# Patient Record
Sex: Female | Born: 1990 | Race: White | Hispanic: No | Smoking: Former smoker
Health system: Southern US, Community
[De-identification: ages and names within clinical notes are randomized; demographics above are authoritative.]

## PROBLEM LIST (undated history)

## (undated) DIAGNOSIS — T8859XA Other complications of anesthesia, initial encounter: Secondary | ICD-10-CM

## (undated) DIAGNOSIS — Z8489 Family history of other specified conditions: Secondary | ICD-10-CM

## (undated) DIAGNOSIS — N840 Polyp of corpus uteri: Secondary | ICD-10-CM

## (undated) DIAGNOSIS — R7689 Other specified abnormal immunological findings in serum: Secondary | ICD-10-CM

## (undated) DIAGNOSIS — R768 Other specified abnormal immunological findings in serum: Secondary | ICD-10-CM

## (undated) DIAGNOSIS — N939 Abnormal uterine and vaginal bleeding, unspecified: Secondary | ICD-10-CM

## (undated) DIAGNOSIS — E282 Polycystic ovarian syndrome: Secondary | ICD-10-CM

## (undated) DIAGNOSIS — M67919 Unspecified disorder of synovium and tendon, unspecified shoulder: Secondary | ICD-10-CM

## (undated) DIAGNOSIS — E559 Vitamin D deficiency, unspecified: Secondary | ICD-10-CM

## (undated) DIAGNOSIS — R569 Unspecified convulsions: Secondary | ICD-10-CM

## (undated) DIAGNOSIS — D649 Anemia, unspecified: Secondary | ICD-10-CM

## (undated) DIAGNOSIS — K611 Rectal abscess: Secondary | ICD-10-CM

## (undated) DIAGNOSIS — R102 Pelvic and perineal pain unspecified side: Secondary | ICD-10-CM

## (undated) DIAGNOSIS — Z87442 Personal history of urinary calculi: Secondary | ICD-10-CM

## (undated) DIAGNOSIS — K219 Gastro-esophageal reflux disease without esophagitis: Secondary | ICD-10-CM

## (undated) DIAGNOSIS — F419 Anxiety disorder, unspecified: Secondary | ICD-10-CM

## (undated) DIAGNOSIS — F40240 Claustrophobia: Secondary | ICD-10-CM

## (undated) DIAGNOSIS — R112 Nausea with vomiting, unspecified: Secondary | ICD-10-CM

## (undated) DIAGNOSIS — Z9889 Other specified postprocedural states: Secondary | ICD-10-CM

---

## 2011-04-28 ENCOUNTER — Emergency Department: Payer: Self-pay | Admitting: *Deleted

## 2011-06-07 ENCOUNTER — Ambulatory Visit: Payer: Self-pay | Admitting: Internal Medicine

## 2011-06-09 ENCOUNTER — Ambulatory Visit: Payer: Self-pay | Admitting: Internal Medicine

## 2011-07-01 ENCOUNTER — Ambulatory Visit: Payer: Self-pay | Admitting: Internal Medicine

## 2011-07-02 ENCOUNTER — Ambulatory Visit: Payer: Self-pay | Admitting: Internal Medicine

## 2011-07-03 ENCOUNTER — Emergency Department: Payer: Self-pay | Admitting: Emergency Medicine

## 2011-07-03 ENCOUNTER — Ambulatory Visit: Payer: Self-pay | Admitting: Internal Medicine

## 2011-07-03 LAB — BASIC METABOLIC PANEL
Anion Gap: 11 (ref 7–16)
BUN: 15 mg/dL (ref 7–18)
Creatinine: 0.8 mg/dL (ref 0.60–1.30)
EGFR (African American): >60
Potassium: 3.2 mmol/L — ABNORMAL LOW (ref 3.5–5.1)
Sodium: 143 mmol/L (ref 136–145)

## 2011-07-03 LAB — CBC WITH DIFFERENTIAL/PLATELET
Eosinophil #: 0.1 10*3/uL (ref 0.0–0.7)
Eosinophil %: 0.9 %
HCT: 41.4 % (ref 35.0–47.0)
Lymphocyte #: 2.8 10*3/uL (ref 1.0–3.6)
MCV: 85 fL (ref 80–100)
Monocyte %: 8.7 %
Neutrophil #: 6.3 10*3/uL (ref 1.4–6.5)
Platelet: 303 10*3/uL (ref 150–440)
RBC: 4.87 10*6/uL (ref 3.80–5.20)
WBC: 10.1 10*3/uL (ref 3.6–11.0)

## 2011-07-03 LAB — PREGNANCY, URINE: Pregnancy Test, Urine: NEGATIVE m[IU]/mL

## 2011-11-12 ENCOUNTER — Ambulatory Visit: Payer: Self-pay | Admitting: Internal Medicine

## 2012-03-21 ENCOUNTER — Emergency Department: Payer: Self-pay | Admitting: Emergency Medicine

## 2012-05-01 ENCOUNTER — Emergency Department: Payer: Self-pay | Admitting: Emergency Medicine

## 2012-05-01 LAB — CBC
HCT: 43.3 % (ref 35.0–47.0)
MCH: 30.6 pg (ref 26.0–34.0)
MCHC: 34.1 g/dL (ref 32.0–36.0)
MCV: 90 fL (ref 80–100)
RDW: 14.1 % (ref 11.5–14.5)

## 2012-05-01 LAB — URINALYSIS, COMPLETE
Bilirubin,UR: NEGATIVE
Glucose,UR: NEGATIVE mg/dL (ref 0–75)
Ketone: NEGATIVE
Protein: NEGATIVE
Specific Gravity: 1.026 (ref 1.003–1.030)
WBC UR: 11 /HPF (ref 0–5)

## 2012-05-01 LAB — LIPASE, BLOOD: Lipase: 240 U/L (ref 73–393)

## 2012-05-01 LAB — COMPREHENSIVE METABOLIC PANEL
Albumin: 4.4 g/dL (ref 3.4–5.0)
Alkaline Phosphatase: 82 U/L (ref 50–136)
BUN: 11 mg/dL (ref 7–18)
Creatinine: 0.83 mg/dL (ref 0.60–1.30)
Glucose: 73 mg/dL (ref 65–99)
Potassium: 3.5 mmol/L (ref 3.5–5.1)
SGOT(AST): 21 U/L (ref 15–37)
SGPT (ALT): 27 U/L (ref 12–78)
Total Protein: 8.5 g/dL — ABNORMAL HIGH (ref 6.4–8.2)

## 2012-07-12 ENCOUNTER — Ambulatory Visit: Payer: Self-pay | Admitting: Family Medicine

## 2012-07-12 LAB — URINALYSIS, COMPLETE
Blood: NEGATIVE
Glucose,UR: NEGATIVE mg/dL (ref 0–75)
Leukocyte Esterase: NEGATIVE
Ph: 7 (ref 4.5–8.0)
Protein: NEGATIVE
Specific Gravity: 1.01 (ref 1.003–1.030)

## 2012-07-14 LAB — URINE CULTURE

## 2012-08-14 ENCOUNTER — Ambulatory Visit: Payer: Self-pay | Admitting: Gastroenterology

## 2012-08-14 DIAGNOSIS — K2100 Gastro-esophageal reflux disease with esophagitis, without bleeding: Secondary | ICD-10-CM | POA: Insufficient documentation

## 2012-09-11 ENCOUNTER — Ambulatory Visit: Payer: Self-pay | Admitting: Gastroenterology

## 2012-10-19 ENCOUNTER — Ambulatory Visit: Payer: Self-pay | Admitting: Surgery

## 2012-10-19 HISTORY — PX: CHOLECYSTECTOMY: SHX55

## 2012-10-20 LAB — PATHOLOGY REPORT

## 2012-12-13 ENCOUNTER — Ambulatory Visit: Payer: Self-pay | Admitting: Internal Medicine

## 2012-12-13 LAB — URINALYSIS, COMPLETE
Bilirubin,UR: NEGATIVE
Ketone: NEGATIVE
Leukocyte Esterase: NEGATIVE
Nitrite: NEGATIVE

## 2012-12-13 LAB — WET PREP, GENITAL

## 2012-12-14 LAB — GC/CHLAMYDIA PROBE AMP

## 2012-12-14 LAB — URINE CULTURE

## 2013-01-02 ENCOUNTER — Ambulatory Visit: Payer: Self-pay | Admitting: Family Medicine

## 2013-03-30 ENCOUNTER — Ambulatory Visit: Payer: Self-pay | Admitting: Gastroenterology

## 2013-04-02 ENCOUNTER — Emergency Department: Payer: Self-pay | Admitting: Emergency Medicine

## 2013-04-02 LAB — CBC
HGB: 14.4 g/dL (ref 12.0–16.0)
Platelet: 244 10*3/uL (ref 150–440)
RBC: 4.55 10*6/uL (ref 3.80–5.20)
WBC: 8.9 10*3/uL (ref 3.6–11.0)

## 2013-04-02 LAB — COMPREHENSIVE METABOLIC PANEL
Albumin: 4.3 g/dL (ref 3.4–5.0)
Chloride: 106 mmol/L (ref 98–107)
Co2: 27 mmol/L (ref 21–32)
Creatinine: 0.77 mg/dL (ref 0.60–1.30)
EGFR (African American): >60
Glucose: 75 mg/dL (ref 65–99)
SGOT(AST): 17 U/L (ref 15–37)
SGPT (ALT): 37 U/L (ref 12–78)
Sodium: 138 mmol/L (ref 136–145)
Total Protein: 7.8 g/dL (ref 6.4–8.2)

## 2013-04-02 LAB — URINALYSIS, COMPLETE
Bilirubin,UR: NEGATIVE
Blood: NEGATIVE
Leukocyte Esterase: NEGATIVE
Ph: 5 (ref 4.5–8.0)
RBC,UR: 6 /HPF (ref 0–5)

## 2013-04-02 LAB — WET PREP, GENITAL

## 2013-04-06 ENCOUNTER — Ambulatory Visit: Payer: Self-pay | Admitting: Gastroenterology

## 2013-04-13 ENCOUNTER — Ambulatory Visit: Payer: Self-pay | Admitting: Gastroenterology

## 2013-06-01 ENCOUNTER — Ambulatory Visit: Payer: Self-pay | Admitting: Internal Medicine

## 2013-08-03 ENCOUNTER — Ambulatory Visit: Payer: Self-pay | Admitting: Gastroenterology

## 2013-08-06 LAB — PATHOLOGY REPORT

## 2013-09-04 ENCOUNTER — Ambulatory Visit: Payer: Self-pay | Admitting: Physician Assistant

## 2013-09-04 LAB — PREGNANCY, URINE: Pregnancy Test, Urine: NEGATIVE m[IU]/mL

## 2013-10-16 ENCOUNTER — Ambulatory Visit: Payer: Self-pay | Admitting: Emergency Medicine

## 2013-10-16 LAB — RAPID STREP-A WITH REFLX: Micro Text Report: NEGATIVE

## 2013-10-18 LAB — BETA STREP CULTURE(ARMC)

## 2013-12-25 ENCOUNTER — Ambulatory Visit: Payer: Self-pay | Admitting: Emergency Medicine

## 2013-12-25 LAB — PREGNANCY, URINE: Pregnancy Test, Urine: NEGATIVE m[IU]/mL

## 2014-02-26 ENCOUNTER — Ambulatory Visit: Payer: Self-pay | Admitting: Physician Assistant

## 2014-06-06 ENCOUNTER — Ambulatory Visit: Payer: Self-pay | Admitting: Internal Medicine

## 2014-07-14 ENCOUNTER — Emergency Department: Payer: Self-pay | Admitting: Emergency Medicine

## 2014-09-20 NOTE — Op Note (Signed)
PATIENT NAME:  Debra Richmond, Debra Richmond MR#:  [id number] DATE OF BIRTH:  1991/01/08  DATE OF PROCEDURE:  10/19/2012  PREOPERATIVE DIAGNOSIS: Chronic acalculous cholecystitis.   POSTOPERATIVE DIAGNOSIS: Chronic acalculous cholecystitis.   PROCEDURE: Laparoscopic cholecystectomy.   SURGEON: Renda RollsWilton Smith, MD  ANESTHESIA: General.   INDICATION: This 24 year old female has history of epigastric pains. She had exact reproduction of her pain with injection of cholecystokinin and surgery was recommended for definitive treatment.   DESCRIPTION OF PROCEDURE: The patient was placed on the operating table in the supine position under general anesthesia. The abdomen was prepared with ChloraPrep and draped in a sterile manner.   A short incision was made in the inferior aspect of the umbilicus so that the lower end of the incision stopped just above her piercing. Dissection was carried down to the deep fascia which was grasped with laryngeal hook and elevated. A Veress needle was inserted, aspirated, and irrigated with a saline solution. The peritoneal cavity was inflated with carbon dioxide. The Veress needle was removed. The 10 mm cannula was inserted. The 10 mm, 0 degree laparoscope was inserted to view the peritoneal cavity. The liver appeared normal. The gallbladder was identified. There were no other gross abnormalities identified. The patient was placed in reverse Trendelenburg position and turned several degrees to the left.  The gallbladder was retracted towards the right shoulder. The pouch of Morison was retracted inferiorly and laterally. The porta hepatis was demonstrated. The cystic duct was dissected free from surrounding structures. The cystic artery was dissected free from surrounding structures. The infundibulum was mobilized with incision of the visceral peritoneum and a critical view of safety was demonstrated. An Endo Clip was placed across the cystic duct adjacent to the neck of the gallbladder.  Reddick catheter was probed into the cystic duct, however, the cystic duct was found to be very small, significantly smaller than the catheter, and therefore the catheter would not go in, and therefore cholangiogram was not done. The Reddick catheter was removed. The cystic duct was doubly ligated with endoclips and divided. The cystic artery was controlled with double endoclips and divided. The gallbladder was dissected free from the liver with hook and cautery. Hemostasis was intact. The gallbladder was delivered up through the infraumbilical incision, opened and then suctioned, and it contained some somewhat dark bile. The gallbladder was removed and submitted in formalin for routine pathology. The right upper quadrant was further inspected. Hemostasis was intact. The cannulas were removed. Carbon dioxide was allowed to escape from the peritoneal cavity. Several small subcutaneous bleeding points were cauterized. The wounds were closed with interrupted 5-0 chromic subcuticular sutures, benzoin, and Steri-Strips. Dressings were applied with paper tape. The patient tolerated surgery satisfactorily and was prepared for transfer to the recovery room. ____________________________ Shela CommonsJ. Renda RollsWilton Smith, MD jws:sb D: 10/19/2012 08:49:45 ET T: 10/19/2012 09:06:14 ET JOB#: [id number]  cc: Adella HareJ. Wilton Smith, MD, <Dictator> Adella HareWILTON J SMITH MD ELECTRONICALLY SIGNED 10/20/2012 11:50

## 2014-10-08 ENCOUNTER — Ambulatory Visit
Admission: EM | Admit: 2014-10-08 | Discharge: 2014-10-08 | Disposition: A | Discharge status: Home / Self Care | Payer: BLUE CROSS/BLUE SHIELD | Attending: Family Medicine | Admitting: Family Medicine

## 2014-10-08 ENCOUNTER — Encounter: Payer: Self-pay | Admitting: Emergency Medicine

## 2014-10-08 DIAGNOSIS — S60312A Abrasion of left thumb, initial encounter: Secondary | ICD-10-CM

## 2014-10-08 DIAGNOSIS — S81822A Laceration with foreign body, left lower leg, initial encounter: Secondary | ICD-10-CM | POA: Diagnosis not present

## 2014-10-08 MED ORDER — LIDOCAINE HCL (PF) 1 % IJ SOLN
5.0000 mL | Freq: Once | INTRAMUSCULAR | Status: AC
  Administered 2014-10-08: 5 mL via INTRADERMAL

## 2014-10-08 MED ORDER — MUPIROCIN 2 % EX OINT: TOPICAL_OINTMENT | CUTANEOUS | Status: DC

## 2014-10-08 NOTE — ED Notes (Signed)
Pt fell onto bush that had just been trimmed. Left leg and Right thumb lacerations, bleeding controlled, bandages applied.

## 2014-10-08 NOTE — Discharge Instructions (Signed)
Abrasion An abrasion is a cut or scrape of the skin. Abrasions do not extend through all layers of the skin and most heal within 10 days. It is important to care for your abrasion properly to prevent infection. CAUSES  Most abrasions are caused by falling on, or gliding across, the ground or other surface. When your skin rubs on something, the outer and inner layer of skin rubs off, causing an abrasion. DIAGNOSIS  Your caregiver will be able to diagnose an abrasion during a physical exam.  TREATMENT  Your treatment depends on how large and deep the abrasion is. Generally, your abrasion will be cleaned with water and a mild soap to remove any dirt or debris. An antibiotic ointment may be put over the abrasion to prevent an infection. A bandage (dressing) may be wrapped around the abrasion to keep it from getting dirty.  You may need a tetanus shot if:  You cannot remember when you had your last tetanus shot.  You have never had a tetanus shot.  The injury broke your skin. If you get a tetanus shot, your arm may swell, get red, and feel warm to the touch. This is common and not a problem. If you need a tetanus shot and you choose not to have one, there is a rare chance of getting tetanus. Sickness from tetanus can be serious.  HOME CARE INSTRUCTIONS   If a dressing was applied, change it at least once a day or as directed by your caregiver. If the bandage sticks, soak it off with warm water.   Wash the area with water and a mild soap to remove all the ointment 2 times a day. Rinse off the soap and pat the area dry with a clean towel.   Reapply any ointment as directed by your caregiver. This will help prevent infection and keep the bandage from sticking. Use gauze over the wound and under the dressing to help keep the bandage from sticking.   Change your dressing right away if it becomes wet or dirty.   Only take over-the-counter or prescription medicines for pain, discomfort, or fever as  directed by your caregiver.   Follow up with your caregiver within 24-48 hours for a wound check, or as directed. If you were not given a wound-check appointment, look closely at your abrasion for redness, swelling, or pus. These are signs of infection. SEEK IMMEDIATE MEDICAL CARE IF:   You have increasing pain in the wound.   You have redness, swelling, or tenderness around the wound.   You have pus coming from the wound.   You have a fever or persistent symptoms for more than 2-3 days.  You have a fever and your symptoms suddenly get worse.  You have a bad smell coming from the wound or dressing.  MAKE SURE YOU:   Understand these instructions.  Will watch your condition.  Will get help right away if you are not doing well or get worse. Document Released: 02/24/2005 Document Revised: 05/03/2012 Document Reviewed: 04/20/2011 [facility] Patient Information 2015 [place], [place]. This information is not intended to replace advice given to you by your health care provider. Make sure you discuss any questions you have with your health care provider. Staple Care and Removal Your caregiver has used staples today to repair your wound. Staples are used to help a wound heal faster by holding the edges of the wound together. The staples can be removed when the wound has healed well enough to stay together  after the staples are removed. A dressing (wound covering), depending on the location of the wound, may have been applied. This may be changed once per day or as instructed. If the dressing sticks, it may be soaked off with soapy water or hydrogen peroxide. Only take over-the-counter or prescription medicines for pain, discomfort, or fever as directed by your caregiver.  If you did not receive a tetanus shot today because you did not recall when your last one was given, check with your caregiver when you have your staples removed to determine if one is needed. Return to your caregiver's office  in 1 week or as suggested to have your staples removed. SEEK IMMEDIATE MEDICAL CARE IF:   You have redness, swelling, or increasing pain in the wound.  You have pus coming from the wound.  You have a fever.  You notice a bad smell coming from the wound or dressing.  Your wound edges break open after staples have been removed. Document Released: 02/09/2001 Document Revised: 08/09/2011 Document Reviewed: 02/24/2005 [facility] Patient Information 2015 [place], [place]. This information is not intended to replace advice given to you by your health care provider. Make sure you discuss any questions you have with your health care provider.

## 2014-10-08 NOTE — ED Provider Notes (Signed)
CSN: [id number]     Arrival date & time 10/08/14  1333 History   First MD Initiated Contact with Patient 10/08/14 1345     Chief Complaint  Patient presents with  . Laceration   (Consider location/radiation/quality/duration/timing/severity/associated sxs/prior Treatment) Patient is a 24 y.o. female presenting with skin laceration. The history is provided by the patient. No language interpreter was used.  Laceration Location:  Leg Leg laceration location:  L lower leg Length (cm):  7cm Depth:  Through dermis Bleeding: controlled   Time since incident:  2 hours Pain details:    Quality:  Throbbing   Severity:  Moderate Foreign body present:  Unable to specify Relieved by:  None tried Worsened by:  Movement Ineffective treatments:  None tried Tetanus status:  Up to date   History reviewed. No pertinent past medical history. Past Surgical History  Procedure Laterality Date  . Cholecystectomy     History reviewed. No pertinent family history. History  Substance Use Topics  . Smoking status: Current Every Day Smoker -- 1.00 packs/day    Types: Cigarettes  . Smokeless tobacco: Not on file  . Alcohol Use: Yes   OB History    No data available     Review of Systems  All other systems reviewed and are negative.   Allergies  Dilaudid; Garlic; and Sulfa antibiotics  Home Medications   Prior to Admission medications   Not on File   BP 118/75 mmHg  Pulse 79  Temp(Src) 97.7 F (36.5 C) (Oral)  Resp 16  Ht 5\' 5"  (1.651 m)  Wt 143 lb (64.864 kg)  BMI 23.80 kg/m2  SpO2 99% Physical Exam  Constitutional: She is oriented to person, place, and time.  HENT:  Head: Normocephalic and atraumatic.  Musculoskeletal: She exhibits tenderness.       Arms:      Legs: Neurological: She is alert and oriented to person, place, and time.  Vitals reviewed.   ED Course  LACERATION REPAIR Date/Time: 10/08/2014 3:12 PM Performed by: Hassan RowanWADE, Janasia Coverdale Authorized by: Hassan RowanWADE,  Rene Sizelove Consent: Verbal consent obtained. Consent given by: patient Patient understanding: patient states understanding of the procedure being performed Patient consent: the patient's understanding of the procedure matches consent given Body area: lower extremity Location details: left lower leg Laceration length: 7 cm Foreign body present: cut by branch. Tendon involvement: none Nerve involvement: none Vascular damage: no Local anesthetic: lidocaine 1% without epinephrine Patient sedated: no Preparation: Patient was prepped and draped in the usual sterile fashion. Amount of cleaning: standard Skin closure: staples Technique: simple Approximation: close Approximation difficulty: simple Patient tolerance: Patient tolerated the procedure well with no immediate complications Comments: 6 staples   (including critical care time) Labs Review Labs Reviewed - No data to display  Imaging Review No results found.   MDM   1. Laceration of lower leg with foreign body, left, initial encounter   2. Abrasion of left thumb, initial encounter       Hassan RowanEugene Edric Fetterman, MD 10/08/14 1920

## 2014-10-23 ENCOUNTER — Ambulatory Visit
Admission: EM | Admit: 2014-10-23 | Discharge: 2014-10-23 | Disposition: A | Discharge status: Home / Self Care | Payer: BLUE CROSS/BLUE SHIELD

## 2014-10-23 NOTE — ED Notes (Signed)
6 staples removed without difficulty. Staples sites slightly red. Wound cleaned prior and post staple removal. Sterile dressing applied with conform bandage and retelast.

## 2014-10-23 NOTE — ED Notes (Signed)
[facility] For [facility]

## 2014-11-26 ENCOUNTER — Emergency Department
Admission: EM | Admit: 2014-11-26 | Discharge: 2014-11-26 | Disposition: A | Discharge status: Home / Self Care | Payer: BLUE CROSS/BLUE SHIELD | Attending: Student | Admitting: Student

## 2014-11-26 ENCOUNTER — Encounter: Payer: Self-pay | Admitting: Medical Oncology

## 2014-11-26 DIAGNOSIS — M25511 Pain in right shoulder: Secondary | ICD-10-CM | POA: Insufficient documentation

## 2014-11-26 DIAGNOSIS — Z792 Long term (current) use of antibiotics: Secondary | ICD-10-CM | POA: Diagnosis not present

## 2014-11-26 DIAGNOSIS — Z720 Tobacco use: Secondary | ICD-10-CM | POA: Insufficient documentation

## 2014-11-26 MED ORDER — PREDNISONE 10 MG PO TABS: ORAL_TABLET | ORAL | Status: DC

## 2014-11-26 NOTE — ED Provider Notes (Signed)
[facility] Provider Note  ____________________________________________  Time seen: 1620  I have reviewed the triage vital signs and the nursing notes.   HISTORY  Chief Complaint Shoulder Pain   HPI Debra Booksshley Koury is a 24 y.o. female is here with complaint of right shoulder pain. Patient states she has a history of rotator cuff problems and has been seen by Dr. Hyacinth MeekerMiller in the past. There is been no injury. Patient states she has been doing some lifting of boxes, but no fall or direct injury to her shoulder. Pain is constant with movement and nonradiating. Pain scale is a 6/10. She is not taking any medication over-the-counter. Movement increases her pain where holding it to her side is an improvement.   History reviewed. No pertinent past medical history.  There are no active problems to display for this patient.   Past Surgical History  Procedure Laterality Date  . Cholecystectomy      Current Outpatient Rx  Name  Route  Sig  Dispense  Refill  . mupirocin ointment (BACTROBAN) 2 %      Apply three times a over abraided areaday   22 g   0   . predniSONE (DELTASONE) 10 MG tablet      Take 6 tablets  today, on day 2 take 5 tablets, day 3 take 4 tablets, day 4 take 3 tablets, day 5 take  2 tablets and 1 tablet the last day   21 tablet   0     Allergies Dilaudid; Garlic; and Sulfa antibiotics  No family history on file.  Social History History  Substance Use Topics  . Smoking status: Current Every Day Smoker -- 1.00 packs/day    Types: Cigarettes  . Smokeless tobacco: Not on file  . Alcohol Use: Yes    Review of Systems Constitutional: No fever/chills Eyes: No visual changes. ENT: No sore throat. Cardiovascular: Denies chest pain. Respiratory: Denies shortness of breath. Gastrointestinal: No abdominal pain.  No nausea, no vomiting.  Musculoskeletal: Negative for back pain. Skin: Negative for rash. Neurological:  Negative for headaches, focal weakness or numbness.  10-point ROS otherwise negative.  ____________________________________________   PHYSICAL EXAM:  VITAL SIGNS: ED Triage Vitals  Enc Vitals Group     BP 11/26/14 1607 128/76 mmHg     Pulse Rate 11/26/14 1607 90     Resp 11/26/14 1607 18     Temp 11/26/14 1607 97.9 F (36.6 C)     Temp Source 11/26/14 1607 Oral     SpO2 11/26/14 1607 98 %     Weight 11/26/14 1607 140 lb (63.504 kg)     Height 11/26/14 1607 5\' 5"  (1.651 m)     Head Cir --      Peak Flow --      Pain Score 11/26/14 1608 6     Pain Loc --      Pain Edu? --      Excl. in GC? --     Constitutional: Alert and oriented. Well appearing and in no acute distress. Eyes: Conjunctivae are normal. PERRL. EOMI. Head: Atraumatic. Nose: No congestion/rhinnorhea. Neck: No stridor.  No cervical tenderness on palpation posterior spine. Hematological/Lymphatic/Immunilogical: No cervical lymphadenopathy. Cardiovascular: Normal rate, regular rhythm. Grossly normal heart sounds.  Good peripheral circulation. Respiratory: Normal respiratory effort.  No retractions. Lungs CTAB. Gastrointestinal: Soft and nontender. No distention. No abdominal bruits. No CVA tenderness. Musculoskeletal: No lower extremity tenderness nor edema.  No joint effusions. Right shoulder no gross  deformity noted. Range of motion is slightly restricted especially in abduction secondary to pain. There is no point tenderness to palpation. Strength is slightly decreased secondary to patient's tolerance for pain. Pulses equal bilaterally Neurologic:  Normal speech and language. No gross focal neurologic deficits are appreciated. Speech is normal. No gait instability. Skin:  Skin is warm, dry and intact. No rash noted. Psychiatric: Mood and affect are normal. Speech and behavior are normal.  ____________________________________________   LABS (all labs ordered are listed, but only abnormal results are  displayed)  Labs Reviewed - No data to display  PROCEDURES  Procedure(s) performed: None  Critical Care performed: No  ____________________________________________   INITIAL IMPRESSION / ASSESSMENT AND PLAN / ED COURSE  Pertinent labs & imaging results that were available during my care of the patient were reviewed by me and considered in my medical decision making (see chart for details).  Patient was started on a prednisone taper for 6 days. She is instructed to use ice as needed for pain and to follow-up with Dr. Hyacinth Meeker if any continued problems. ____________________________________________   FINAL CLINICAL IMPRESSION(S) / ED DIAGNOSES  Final diagnoses:  Acute shoulder pain, right      Tommi Rumps, PA-C 11/26/14 1813  Gayla Doss, MD 11/26/14 2045

## 2014-11-26 NOTE — ED Notes (Signed)
See provider note for full assessment

## 2014-11-26 NOTE — ED Notes (Signed)
Pt reports hx of rt rotator cuff problems for a while, in past few days pt reports worsening pain since lifting boxes.

## 2014-11-26 NOTE — ED Notes (Signed)
Right shoulder pain w/o new injury.Debra Richmond.limited movement d/t pain

## 2014-12-11 ENCOUNTER — Ambulatory Visit
Admission: EM | Admit: 2014-12-11 | Discharge: 2014-12-11 | Disposition: A | Discharge status: Home / Self Care | Payer: BLUE CROSS/BLUE SHIELD | Attending: Family Medicine | Admitting: Family Medicine

## 2014-12-11 DIAGNOSIS — S61012A Laceration without foreign body of left thumb without damage to nail, initial encounter: Secondary | ICD-10-CM

## 2014-12-11 MED ORDER — MUPIROCIN 2 % EX OINT: 1.0000 "application " | TOPICAL_OINTMENT | Freq: Three times a day (TID) | CUTANEOUS | Status: DC

## 2014-12-11 NOTE — ED Notes (Signed)
Pt states "I cut myself with a kitchen chopper." A small mostly superficial laceration noted to posterior left hand 1.0 cm x 0 cm. No active bleeding. Tetanus is current.

## 2014-12-11 NOTE — ED Provider Notes (Signed)
CSN: [id number]     Arrival date & time 12/11/14  1430 History   First MD Initiated Contact with Patient 12/11/14 1557     Chief Complaint  Patient presents with  . Laceration   (Consider location/radiation/quality/duration/timing/severity/associated sxs/prior Treatment) HPI  24 year old female who is well-known to this practice from frequent visits especially for lacerations. Today she sustained a cut on her left hand dorsum overlying the CMP joint that she cut on a clean vegetable chopper. She is current on her tetanus. Complaining of slight numbness small area distal to the laceration  History reviewed. No pertinent past medical history. Past Surgical History  Procedure Laterality Date  . Cholecystectomy     No family history on file. History  Substance Use Topics  . Smoking status: Current Every Day Smoker -- 1.00 packs/day    Types: Cigarettes  . Smokeless tobacco: Not on file  . Alcohol Use: Yes   OB History    No data available     Review of Systems  All other systems reviewed and are negative.   Allergies  Dilaudid; Garlic; and Sulfa antibiotics  Home Medications   Prior to Admission medications   Medication Sig Start Date End Date Taking? Authorizing Provider  mupirocin ointment (BACTROBAN) 2 % Apply 1 application topically 3 (three) times daily. 12/11/14   Lutricia FeilWilliam P Yaron Grasse, PA-C  predniSONE (DELTASONE) 10 MG tablet Take 6 tablets  today, on day 2 take 5 tablets, day 3 take 4 tablets, day 4 take 3 tablets, day 5 take  2 tablets and 1 tablet the last day 11/26/14   Tommi Rumpshonda L Summers, PA-C   BP 125/65 mmHg  Pulse 76  Temp(Src) 98.2 F (36.8 C) (Tympanic)  Resp 16  Ht 5\' 4"  (1.626 m)  Wt 135 lb (61.236 kg)  BMI 23.16 kg/m2  SpO2 100%  LMP 12/02/2014 Physical Exam  Constitutional: She appears well-developed and well-nourished.  HENT:  Head: Normocephalic.  Eyes: Pupils are equal, round, and reactive to light.  Skin: Skin is warm and dry.  There is a 1 cm  transverse laceration overlying the dorsum of the hand near the CMP joint. The laceration does gape but is barely into the subcutaneous tissue. She has full extension power of the thumb with us with a mild hip esthesia for a short distance just distal to the laceration.  Psychiatric: She has a normal mood and affect. Her behavior is normal. Judgment and thought content normal.  Nursing note and vitals reviewed.   ED Course  LACERATION REPAIR Date/Time: 12/11/2014 4:27 PM Performed by: Lutricia FeilOEMER, Katiria Calame P Authorized by: Jolene ProvostPATEL, KIRTIDA Consent: Verbal consent obtained. Written consent not obtained. Risks and benefits: risks, benefits and alternatives were discussed Consent given by: patient Patient understanding: patient states understanding of the procedure being performed Patient consent: the patient's understanding of the procedure matches consent given Patient identity confirmed: verbally with patient, arm band and hospital-assigned identification number Body area: upper extremity Location details: left thumb Laceration length: 1 cm Foreign bodies: no foreign bodies Tendon involvement: none Nerve involvement: none Vascular damage: no Anesthesia: local infiltration Local anesthetic: lidocaine 1% without epinephrine Anesthetic total: 1 ml Patient sedated: no Preparation: Patient was prepped and draped in the usual sterile fashion. Irrigation solution: saline Irrigation method: jet lavage Amount of cleaning: standard Debridement: none Degree of undermining: none Skin closure: 4-0 nylon Number of sutures: 2 Technique: simple Approximation: loose Approximation difficulty: simple Dressing: 4x4 sterile gauze and antibiotic ointment Patient tolerance: Patient tolerated the procedure well  with no immediate complications   (including critical care time) Labs Review Labs Reviewed - No data to display  Imaging Review No results found.   MDM   1. Laceration of skin of left thumb,  initial encounter    Discharge Medication List as of 12/11/2014  4:20 PM     Plan: 1. Diagnosis reviewed with patient 2. rx as per orders; risks, benefits, potential side effects reviewed with patient 3. Recommend supportive treatment with elevation,rest. Keep dry 24 hours.  4. F/u 2 days for wound check and plan on leaving sutures in for 10 days  Lutricia Feil, PA-C 12/11/14 1632  Lutricia Feil, PA-C 12/12/14 [phone]

## 2015-01-02 ENCOUNTER — Encounter: Payer: Self-pay | Admitting: Emergency Medicine

## 2015-01-02 ENCOUNTER — Emergency Department
Admission: EM | Admit: 2015-01-02 | Discharge: 2015-01-02 | Disposition: A | Discharge status: Home / Self Care | Payer: Worker's Compensation | Attending: Emergency Medicine | Admitting: Emergency Medicine

## 2015-01-02 DIAGNOSIS — R0600 Dyspnea, unspecified: Secondary | ICD-10-CM | POA: Insufficient documentation

## 2015-01-02 DIAGNOSIS — R1110 Vomiting, unspecified: Secondary | ICD-10-CM | POA: Insufficient documentation

## 2015-01-02 DIAGNOSIS — G4459 Other complicated headache syndrome: Secondary | ICD-10-CM | POA: Insufficient documentation

## 2015-01-02 DIAGNOSIS — Z720 Tobacco use: Secondary | ICD-10-CM | POA: Insufficient documentation

## 2015-01-02 DIAGNOSIS — R079 Chest pain, unspecified: Secondary | ICD-10-CM | POA: Insufficient documentation

## 2015-01-02 DIAGNOSIS — Z792 Long term (current) use of antibiotics: Secondary | ICD-10-CM | POA: Insufficient documentation

## 2015-01-02 LAB — CBC
HCT: 45 % (ref 35.0–47.0)
HEMOGLOBIN: 15.3 g/dL (ref 12.0–16.0)
MCH: 31.1 pg (ref 26.0–34.0)
MCHC: 34 g/dL (ref 32.0–36.0)
MCV: 91.4 fL (ref 80.0–100.0)
Platelets: 256 10*3/uL (ref 150–440)
RBC: 4.92 MIL/uL (ref 3.80–5.20)
RDW: 13.1 % (ref 11.5–14.5)
WBC: 10.7 10*3/uL (ref 3.6–11.0)

## 2015-01-02 LAB — CARBOXYHEMOGLOBIN
CARBOXYHEMOGLOBIN: 2 % (ref 1.5–9.0)
O2 Saturation: 79.8 %
TOTAL OXYGEN CONTENT: 76.8 mL/dL

## 2015-01-02 MED ORDER — ONDANSETRON 4 MG PO TBDP
8.0000 mg | ORAL_TABLET | Freq: Once | ORAL | Status: AC
  Administered 2015-01-02: 8 mg via ORAL

## 2015-01-02 MED ORDER — ONDANSETRON 4 MG PO TBDP
ORAL_TABLET | ORAL | Status: AC
  Filled 2015-01-02: qty 2

## 2015-01-02 MED ORDER — ONDANSETRON 8 MG PO TBDP: 8.0000 mg | ORAL_TABLET | Freq: Three times a day (TID) | ORAL | Status: DC | PRN

## 2015-01-02 NOTE — ED Notes (Signed)
This RN spoke with pt employer and they do not require a uds at this time.

## 2015-01-02 NOTE — ED Provider Notes (Signed)
[facility] Emergency Department Provider Note  ____________________________________________  Time seen: 5:20 PM  I have reviewed the triage vital signs and the nursing notes.   HISTORY  Chief Complaint Ingestion    HPI Debra Richmond is a 24 y.o. female who complains of exposure to a gas leak. 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 She notified the mall manager who told her they would take care of it overnight. However, when she returned today to work, the smallest still there and was more severe. She was exposed to it for 6 hours last night, and again 6 hours today. Throughout the 6 hours today, she began having a severe headache, nausea, shortness of breath and chest tightness, and eventually vomiting. When she started vomiting she left work and came to the ED. She felt also like she was "out of it" and like everything was spinning, although she did not have any falls.  Since being in the ED, she does feel better, but still feels fatigued and nauseated and having a headache.   History reviewed. No pertinent past medical history.  There are no active problems to display for this patient.   Past Surgical History  Procedure Laterality Date  . Cholecystectomy      Current Outpatient Rx  Name  Route  Sig  Dispense  Refill  . mupirocin ointment (BACTROBAN) 2 %   Topical   Apply 1 application topically 3 (three) times daily.   22 g   0   . ondansetron (ZOFRAN ODT) 8 MG disintegrating tablet   Oral   Take 1 tablet (8 mg total) by mouth every 8 (eight) hours as needed for nausea or vomiting.   20 tablet   0   . predniSONE (DELTASONE) 10 MG tablet      Take 6 tablets  today, on day 2 take 5 tablets, day 3 take 4 tablets, day 4 take 3 tablets, day 5 take  2 tablets and 1 tablet the last day   21 tablet  0     Allergies Dilaudid; Garlic; Prednisone; and Sulfa antibiotics  No family history on file.  Social History History  Substance Use Topics  . Smoking status: Current Every Day Smoker -- 1.00 packs/day    Types: Cigarettes  . Smokeless tobacco: Not on file  . Alcohol Use: Yes    Review of Systems  Constitutional: No fever or chills. No weight changes Eyes:No blurry vision or double vision.  ENT: No sore throat. Cardiovascular: Positive chest pain. Respiratory: Mild dyspnea without cough. Gastrointestinal: Negative for abdominal pain, positive vomiting negative diarrhea.  No BRBPR or melena. Genitourinary: Negative for dysuria, urinary retention, bloody urine, or difficulty urinating. Musculoskeletal: Negative for back pain. No joint swelling or pain. Skin: Negative for rash. Neurological: Negative for headaches, focal weakness or numbness. Psychiatric:No anxiety or depression.   Endocrine:No hot/cold intolerance, changes in energy, or sleep difficulty.  10-point ROS otherwise negative.  ____________________________________________   PHYSICAL EXAM:  VITAL SIGNS: ED Triage Vitals  Enc Vitals Group     BP 01/02/15 1507 128/74 mmHg     Pulse Rate 01/02/15 1507 74     Resp 01/02/15 1507 18     Temp 01/02/15 1507 98.2 F (36.8 C)     Temp Source 01/02/15 1507 Oral  SpO2 01/02/15 1507 100 %     Weight 01/02/15 1507 135 lb (61.236 kg)     Height 01/02/15 1507 5\' 5"  (1.651 m)     Head Cir --      Peak Flow --      Pain Score --      Pain Loc --      Pain Edu? --      Excl. in GC? --      Constitutional: Alert and oriented. Well appearing and in no distress. Eyes: No scleral icterus. No conjunctival pallor. PERRL. EOMI ENT   Head: Normocephalic and atraumatic.   Nose: No congestion/rhinnorhea. No septal hematoma   Mouth/Throat: MMM, no pharyngeal erythema. No peritonsillar mass. No uvula shift.   Neck: No stridor. No SubQ emphysema. No  meningismus. Hematological/Lymphatic/Immunilogical: No cervical lymphadenopathy. Cardiovascular: RRR. Normal and symmetric distal pulses are present in all extremities. No murmurs, rubs, or gallops. Respiratory: Normal respiratory effort without tachypnea nor retractions. Breath sounds are clear and equal bilaterally. No wheezes/rales/rhonchi. Gastrointestinal: Soft and nontender. No distention. There is no CVA tenderness.  No rebound, rigidity, or guarding. Genitourinary: deferred Musculoskeletal: Nontender with normal range of motion in all extremities. No joint effusions.  No lower extremity tenderness.  No edema. Neurologic:   Normal speech and language.  CN 2-10 normal. Motor grossly intact. No pronator drift.  Normal gait. No gross focal neurologic deficits are appreciated.  Skin:  Skin is warm, dry and intact. No rash noted.  No petechiae, purpura, or bullae. Psychiatric: Mood and affect are normal. Speech and behavior are normal. Patient exhibits appropriate insight and judgment.  ____________________________________________    LABS (pertinent positives/negatives) (all labs ordered are listed, but only abnormal results are displayed) Labs Reviewed  CBC  CARBOXYHEMOGLOBIN   ____________________________________________   EKG    ____________________________________________    RADIOLOGY    ____________________________________________   PROCEDURES  ____________________________________________   INITIAL IMPRESSION / ASSESSMENT AND PLAN / ED COURSE  Pertinent labs & imaging results that were available during my care of the patient were reviewed by me and considered in my medical decision making (see chart for details).  Patient presents with exposure to a likely gas leak in her home goods store, with symptoms consistent with carbon monoxide toxicity. I placed the patient on 100% nonrebreather mask while awaiting results of a carboxyhemoglobin test. Low suspicion  for cyanide toxicity, stroke CVA meningitis encephalitis ACS PE TAD pneumothorax carditis mediastinitis pneumonia or sepsis.  Of note, patient states that she is in a same sex relationship with another female and has not had intercourse with a female and is very sure that she is not possibly pregnant. ----------------------------------------- 6:39 PM on 01/02/2015 -----------------------------------------  Patient however she feels much better and back to normal. She has removed the oxygen mask by herself and is talking on the phone. Carboxyhemoglobin level is 2 indicating that she has not had significant toxic exposure. Her headache and GI symptoms appear to be a reaction to volatile chemical exposure, but there did not appear to be any lasting toxic effects. We'll prescribe Zofran as needed and have her follow up with primary care. ____________________________________________   FINAL CLINICAL IMPRESSION(S) / ED DIAGNOSES  Final diagnoses:  Other complicated headache syndrome      Sharman Cheek, MD 01/02/15 1840

## 2015-01-02 NOTE — ED Notes (Signed)
Patient reports there was a gas leak yesterday and today at work. Was in area 6 hours last night, 6 hours today. States she does not feel like herself today. Patient is unsure of what kind of gas leak.

## 2015-01-02 NOTE — ED Notes (Signed)
Resp notified of new order for lab.

## 2015-01-04 ENCOUNTER — Ambulatory Visit
Admission: EM | Admit: 2015-01-04 | Discharge: 2015-01-04 | Disposition: A | Discharge status: Home / Self Care | Payer: BLUE CROSS/BLUE SHIELD | Attending: Internal Medicine | Admitting: Internal Medicine

## 2015-01-04 DIAGNOSIS — S0181XA Laceration without foreign body of other part of head, initial encounter: Secondary | ICD-10-CM | POA: Diagnosis not present

## 2015-01-04 MED ORDER — LIDOCAINE-EPINEPHRINE-TETRACAINE (LET) SOLUTION
3.0000 mL | Freq: Once | NASAL | Status: AC
  Administered 2015-01-04: 3 mL via TOPICAL

## 2015-01-04 NOTE — ED Notes (Signed)
Patient with cut above bridge of nose. 1/2 lac. Bleeding controlled.

## 2015-01-04 NOTE — ED Provider Notes (Signed)
CSN: [id number]     Arrival date & time 01/04/15  1318 History   First MD Initiated Contact with Patient 01/04/15 [phone]     Chief Complaint  Patient presents with  . Facial Laceration   (Consider location/radiation/quality/duration/timing/severity/associated sxs/prior Treatment) HPI  24 yo F, with partner, presents for eval and Rx of  forehead laceration.   42 month old puppy jumped in excited play and toenail grazed the medial forehead adjacent to the right eyebrow  Within hour prior to arrival Toenail, not teeth Tetanus up to date Jun 01, 2013 No eye trauma, mild discomfort, not actively bleeding at this time  History reviewed. No pertinent past medical history. Past Surgical History  Procedure Laterality Date  . Cholecystectomy     History reviewed. No pertinent family history. History  Substance Use Topics  . Smoking status: Current Every Day Smoker -- 1.00 packs/day    Types: Cigarettes  . Smokeless tobacco: Not on file  . Alcohol Use: Yes   OB History    No data available     Review of Systems Constitutional: No fever.  Eyes: No visual changes. ENT:No sore throat. Cardiovascular:Negative for chest pain/palpitations Respiratory: Negative for shortness of breath Gastrointestinal: No abdominal pain. No nausea,vomiting, diarrhea Genitourinary: Negative for dysuria. Normal urination. Musculoskeletal: Negative for back pain. FROM extremities without pain Skin: Negative for rash- linear laceration medial forehead Neurological: Negative for headache, focal weakness or numbness Allergies  Dilaudid; Garlic; Prednisone; and Sulfa antibiotics  Home Medications   Prior to Admission medications   Medication Sig Start Date End Date Taking? Authorizing Provider  mupirocin ointment (BACTROBAN) 2 % Apply 1 application topically 3 (three) times daily. 12/11/14   Lutricia Feil, PA-C  ondansetron (ZOFRAN ODT) 8 MG disintegrating tablet Take 1 tablet (8 mg total) by mouth every 8  (eight) hours as needed for nausea or vomiting. 01/02/15   Sharman Cheek, MD  predniSONE (DELTASONE) 10 MG tablet Take 6 tablets  today, on day 2 take 5 tablets, day 3 take 4 tablets, day 4 take 3 tablets, day 5 take  2 tablets and 1 tablet the last day 11/26/14   Tommi Rumps, PA-C   BP 116/71 mmHg  Pulse 78  Temp(Src) 97.4 F (36.3 C) (Tympanic)  Resp 20  Ht 5\' 5"  (1.651 m)  Wt 135 lb (61.236 kg)  BMI 22.47 kg/m2  SpO2 100%  LMP 12/02/2014 Physical Exam  Constitutional -alert and oriented,well appearing and in no acute distress, 2 cm superficial linear laceration adjacent to right medial eyebrow Head- normocephalic Eyes- conjunctiva normal, EOMI ,conjugate gaze- no visual disturbance-FROM eyebrows: frown ,smile, grimace, raised lowered without difficulty Nose- no congestion or rhinorrhea Mouth/throat- mucous membranes moist , Neck- supple without glandular enlargement CV- regular rate, grossly normal heart sounds,  Resp-no distress, normal respiratory effort,clear to auscultation bilaterally GI-no distention GU- not examined MSK- no tender, normal ROM, all extremities, ambulatory, self-care Neuro- normal speech and language, no gross focal neurological deficit appreciated,  Skin-warm,dry ,intact; no rash noted except as noted - Psych-mood and affect grossly normal; speech and behavior grossly normal ED Course  Procedures (including critical care time) Labs Review Labs Reviewed - No data to display  Imaging Review No results found.  LET and 2-x was placed by nursing staff.on arrival  Wound is cleansed and rinsed with saline. Edges re-approximate with gentle pressure.  Discussed with patient and girlfriend and opted to proceed with Dermabond closure. Applied with good results. Dried.  Care discussed.  MDM   1. Laceration of forehead without complication, initial encounter    Diagnosis and treatment discussed.Dermabond/wound care handout given.   Questions  fielded, expectations and recommendations reviewed. Patient expresses understanding. Will return to [facility] with questions, concern or exacerbation.     Rae Halsted, PA-C 01/04/15 [phone]

## 2015-01-04 NOTE — Discharge Instructions (Signed)
Facial Laceration  A facial laceration is a cut on the face. These injuries can be painful and cause bleeding. Lacerations usually heal quickly, but they need special care to reduce scarring. DIAGNOSIS  Your health care provider will take a medical history, ask for details about how the injury occurred, and examine the wound to determine how deep the cut is. TREATMENT  Some facial lacerations may not require closure. Others may not be able to be closed because of an increased risk of infection. The risk of infection and the chance for successful closure will depend on various factors, including the amount of time since the injury occurred. The wound may be cleaned to help prevent infection. If closure is appropriate, pain medicines may be given if needed. Your health care provider will use stitches (sutures), wound glue (adhesive), or skin adhesive strips to repair the laceration. These tools bring the skin edges together to allow for faster healing and a better cosmetic outcome. If needed, you may also be given a tetanus shot. HOME CARE INSTRUCTIONS  Only take over-the-counter or prescription medicines as directed by your health care provider.  Follow your health care provider's instructions for wound care. These instructions will vary depending on the technique used for closing the wound.  You may shower as usual after the first 36 hours. Do not soak the wound in water until the sutures are removed.  For Wound Adhesive: Keep clean and dry for 3-4 days then  You may briefly wet your wound in the shower or bath. Do not soak or scrub the wound. Do not swim. Avoid periods of heavy sweating until the skin adhesive has fallen off on its own. After showering or bathing, gently pat the wound dry with a clean towel.   Do not apply liquid medicine, cream medicine, ointment medicine, or makeup to your wound while the skin adhesive is in place. This may loosen the film before your wound is healed.   If  a dressing is placed over the wound, be careful not to apply tape directly over the skin adhesive. This may cause the adhesive to be pulled off before the wound is healed.   Avoid prolonged exposure to sunlight or tanning lamps while the skin adhesive is in place.  The skin adhesive will usually remain in place for 5-10 days, then naturally fall off the skin. Do not pick at the adhesive film.  After Healing: Once the wound has healed, cover the wound with sunscreen during the day for 1 full year. This can help minimize scarring. Exposure to ultraviolet light in the first year will darken the scar. It can take 1-2 years for the scar to lose its redness and to heal completely.  SEEK IMMEDIATE MEDICAL CARE IF:  You have redness, pain, or swelling around the wound.   You see ayellowish-white fluid (pus) coming from the wound.   You have chills or a fever.  MAKE SURE YOU:  Understand these instructions.  Will watch your condition.  Will get help right away if you are not doing well or get worse. Document Released: 06/24/2004 Document Revised: 03/07/2013 Document Reviewed: 12/28/2012 [facility] Patient Information 2015 [place], [place]. This information is not intended to replace advice given to you by your health care provider. Make sure you discuss any questions you have with your health care provider.

## 2015-04-23 ENCOUNTER — Ambulatory Visit
Admission: EM | Admit: 2015-04-23 | Discharge: 2015-04-23 | Disposition: A | Discharge status: Home / Self Care | Payer: BLUE CROSS/BLUE SHIELD | Attending: Family Medicine | Admitting: Family Medicine

## 2015-04-23 ENCOUNTER — Ambulatory Visit (INDEPENDENT_AMBULATORY_CARE_PROVIDER_SITE_OTHER): Payer: BLUE CROSS/BLUE SHIELD

## 2015-04-23 DIAGNOSIS — S8002XA Contusion of left knee, initial encounter: Secondary | ICD-10-CM | POA: Diagnosis not present

## 2015-04-23 DIAGNOSIS — S81012A Laceration without foreign body, left knee, initial encounter: Secondary | ICD-10-CM

## 2015-04-23 HISTORY — DX: Polycystic ovarian syndrome: E28.2

## 2015-04-23 HISTORY — DX: Gastro-esophageal reflux disease without esophagitis: K21.9

## 2015-04-23 MED ORDER — BACITRACIN ZINC 500 UNIT/GM EX OINT: TOPICAL_OINTMENT | Freq: Two times a day (BID) | CUTANEOUS | Status: DC

## 2015-04-23 MED ORDER — LIDOCAINE HCL (PF) 1 % IJ SOLN: 5.0000 mL | Freq: Once | INTRAMUSCULAR | Status: DC

## 2015-04-23 MED ORDER — LIDOCAINE HCL (PF) 1 % IJ SOLN
5.0000 mL | Freq: Once | INTRAMUSCULAR | Status: AC
  Administered 2015-04-23: 5 mL

## 2015-04-23 MED ORDER — LIDOCAINE-EPINEPHRINE-TETRACAINE (LET) SOLUTION: 3.0000 mL | Freq: Once | NASAL | Status: DC

## 2015-04-23 MED ORDER — LIDOCAINE-EPINEPHRINE-TETRACAINE (LET) SOLUTION
3.0000 mL | Freq: Once | NASAL | Status: AC
  Administered 2015-04-23: 3 mL via TOPICAL

## 2015-04-23 NOTE — ED Provider Notes (Signed)
Debra Richmond. [facility]  ____________________________________________  Time seen: Approximately 3:08 PM  I have reviewed the triage vital signs and the nursing notes.   HISTORY  Chief Complaint Laceration   HPI Debra Richmond is a 24 y.o. female presents for the complaints of left knee pain and laceration. Patient reports a prior to arrival she was climbing over some boxes in her room as she just moved and states that her left knee snagged on something causing laceration. Denies fall. Denies head injury or loss of consciousness. Complains of pain to left knee at 7 out of 10 and states that it does hurt to touch as well as some pain when walking. Denies numbness or tingling sensation. Denies other pain or injury.  Reports last tetanus immunization approximately 2 years ago.   Past Medical History  Diagnosis Date  . GERD (gastroesophageal reflux disease)   . PCOD (polycystic ovarian disease)     There are no active problems to display for this patient.   Past Surgical History  Procedure Laterality Date  . Cholecystectomy      Current Outpatient Rx  Name  Route  Sig  Dispense  Refill  .           .           .             Allergies Dilaudid; Garlic; Prednisone; and Sulfa antibiotics  History reviewed. No pertinent family history.  Social History Social History  Substance Use Topics  . Smoking status: Current Every Day Smoker -- 1.00 packs/day    Types: Cigarettes  . Smokeless tobacco: None  . Alcohol Use: Yes    Review of Systems Constitutional: No fever/chills Eyes: No visual changes. ENT: No sore throat. Cardiovascular: Denies chest pain. Respiratory: Denies shortness of breath. Gastrointestinal: No abdominal pain.  No nausea, no vomiting.  No diarrhea.  No constipation. Genitourinary: Negative for dysuria. Musculoskeletal: Negative for back pain. Left knee pain. Skin: Negative for rash. Laceration to left knee. Neurological: Negative for headaches, focal  weakness or numbness.  10-point ROS otherwise negative.  ____________________________________________   PHYSICAL EXAM:  VITAL SIGNS: ED Triage Vitals  Enc Vitals Group     BP 04/23/15 1457 127/68 mmHg     Pulse Rate 04/23/15 1457 76     Resp 04/23/15 1457 16     Temp 04/23/15 1457 97.3 F (36.3 C)     Temp Source 04/23/15 1457 Tympanic     SpO2 04/23/15 1457 100 %     Weight 04/23/15 1457 143 lb (64.864 kg)     Height 04/23/15 1457 5\' 4"  (1.626 m)     Head Cir --      Peak Flow --      Pain Score 04/23/15 1500 6     Pain Loc --      Pain Edu? --      Excl. in GC? --     Constitutional: Alert and oriented. Well appearing and in no acute distress. Eyes: Conjunctivae are normal. PERRL. EOMI. Head: Atraumatic. Nontender. Nose: No congestion/rhinnorhea.  Mouth/Throat: Mucous membranes are moist.  Oropharynx non-erythematous. Neck: No stridor.  No cervical spine tenderness to palpation. Hematological/Lymphatic/Immunilogical: No cervical lymphadenopathy. Cardiovascular: Normal rate, regular rhythm. Grossly normal heart sounds.  Good peripheral circulation. Respiratory: Normal respiratory effort.  No retractions. Lungs CTAB. Gastrointestinal: Soft and nontender. No distention. Normal Bowel sounds.  . Musculoskeletal: No lower or upper extremity tenderness nor edema.   Except: Left knee tenderness.  See skin below. Neurologic:  Normal speech and language. No gross focal neurologic deficits are appreciated. Skin:  Skin is warm, dry and intact. No rash noted. Except: Anterior left knee over the mid anterior patella 5 cm superficial laceration present, mild to moderate tenderness to palpation direct pressure patella, no surrounding tenderness, knee stable, no pain with anterior or posterior drawer test, no pain with medial or lateral stress test, full range of motion, no visualization foreign bodies, skin otherwise intact. Mild antalgic gait. Psychiatric: Mood and affect are normal.  Speech and behavior are normal.  ____________________________________________   LABS (all labs ordered are listed, but only abnormal results are displayed)  Labs Reviewed - No data to display  RADIOLOGY  LEFT KNEE - COMPLETE 4+ VIEW  COMPARISON: None.  FINDINGS: There is no evidence of fracture, dislocation, or joint effusion. There is no evidence of arthropathy or other focal bone abnormality. Soft tissues are unremarkable.  IMPRESSION: Normal left knee.   Electronically Signed By: Lupita Raider, M.D. On: 04/23/2015 15:28  I, Renford Dills, personally viewed and evaluated these images (plain radiographs) as part of my medical decision making.   ____________________________________________   PROCEDURES  Procedure(s) performed:  Procedure(s) performed:  Procedure explained and verbal consent obtained. Consent: Verbal consent obtained. Written consent not obtained. Risks and benefits: risks, benefits and alternatives were discussed Patient identity confirmed: verbally with patient and hospital-assigned identification number  Consent given by: patient   Laceration Repair Location: left knee Length: 5cm Foreign bodies: no foreign bodies Tendon involvement: none Nerve involvement: none Preparation: Patient was prepped and draped in the usual sterile fashion. Anesthesia with 1% Lidocaine 5 mls Irrigation solution: saline and betadine Irrigation method: jet lavage Amount of cleaning: copious Repaired with 4-0 nylon  Number of sutures: 8 Technique: simple interrupted  Approximation: loose Patient tolerate well. Wound well approximated post repair.  Antibiotic ointment and dressing applied.  Wound care instructions provided.  Observe for any signs of infection or other problems.    Left knee immobilizer applied, neurovascular intact post application. Denies need for crutches.  ____________________________________________   INITIAL IMPRESSION /  ASSESSMENT AND PLAN / ED COURSE  Pertinent labs & imaging results that were available during my care of the patient were reviewed by me and considered in my medical decision making (see chart for details).  Very well-appearing patient. No acute distress. Presents with family at bedside for the month of left knee pain and laceration to left knee sustained prior to arrival. Reports tetanus immunization is up-to-date. Denies other pain or injury. Left knee x-ray normal left knee, no acute changes. 5 cm laceration repaired. Patient to return to urgent care in 7-10 days for suture removal. Dressing applied. Patient also directed and given left knee immobilizer to restrict frequent flexion over sutures and to protect suture integrity. Elevate and apply ice. Discussed cleaning. Work note for today given.  Discussed follow up with Primary care physician this week. Discussed follow up and return parameters including no resolution or any worsening concerns. Patient verbalized understanding and agreed to plan.   ____________________________________________   FINAL CLINICAL IMPRESSION(S) / ED DIAGNOSES  Final diagnoses:  Knee laceration, left, initial encounter  Knee contusion, left, initial encounter       Renford Dills, NP 04/23/15 1628

## 2015-04-23 NOTE — Discharge Instructions (Signed)
Keep clean. Clean daily with soap and water, rinse, pat dry then apply thin layer topical antibiotic ointment. Wear knee immobilizer as discussed. Rest. Avoid strenuous activity. Take over the counter tylenol or ibuprofen as needed.   Follow up with your primary care physician this week as needed. Return to urgent care in 7-10days for suture removal, return sooner for new or worsening concerns.   Contusion A contusion is a deep bruise. Contusions are the result of a blunt injury to tissues and muscle fibers under the skin. The injury causes bleeding under the skin. The skin overlying the contusion may turn blue, purple, or yellow. Minor injuries will give you a painless contusion, but more severe contusions may stay painful and swollen for a few weeks.  CAUSES  This condition is usually caused by a blow, trauma, or direct force to an area of the body. SYMPTOMS  Symptoms of this condition include:  Swelling of the injured area.  Pain and tenderness in the injured area.  Discoloration. The area may have redness and then turn blue, purple, or yellow. DIAGNOSIS  This condition is diagnosed based on a physical exam and medical history. An X-ray, CT scan, or MRI may be needed to determine if there are any associated injuries, such as broken bones (fractures). TREATMENT  Specific treatment for this condition depends on what area of the body was injured. In general, the best treatment for a contusion is resting, icing, applying pressure to (compression), and elevating the injured area. This is often called the RICE strategy. Over-the-counter anti-inflammatory medicines may also be recommended for pain control.  HOME CARE INSTRUCTIONS   Rest the injured area.  If directed, apply ice to the injured area:  Put ice in a plastic bag.  Place a towel between your skin and the bag.  Leave the ice on for 20 minutes, 2-3 times per day.  If directed, apply light compression to the injured area using an  elastic bandage. Make sure the bandage is not wrapped too tightly. Remove and reapply the bandage as directed by your health care provider.  If possible, raise (elevate) the injured area above the level of your heart while you are sitting or lying down.  Take over-the-counter and prescription medicines only as told by your health care provider. SEEK MEDICAL CARE IF:  Your symptoms do not improve after several days of treatment.  Your symptoms get worse.  You have difficulty moving the injured area. SEEK IMMEDIATE MEDICAL CARE IF:   You have severe pain.  You have numbness in a hand or foot.  Your hand or foot turns pale or cold.   This information is not intended to replace advice given to you by your health care provider. Make sure you discuss any questions you have with your health care provider.   Document Released: 02/24/2005 Document Revised: 02/05/2015 Document Reviewed: 10/02/2014 Elsevier Interactive Patient Education 2016 [employer] Inc[employer]  Laceration Care, Adult A laceration is a cut that goes through all of the layers of the skin and into the tissue that is right under the skin. Some lacerations heal on their own. Others need to be closed with stitches (sutures), staples, skin adhesive strips, or skin glue. Proper laceration care minimizes the risk of infection and helps the laceration to heal better. HOW TO CARE FOR YOUR LACERATION If sutures or staples were used:  Keep the wound clean and dry.  If you were given a bandage (dressing), you should change it at least one time per  day or as told by your health care provider. You should also change it if it becomes wet or dirty.  Keep the wound completely dry for the first 24 hours or as told by your health care provider. After that time, you may shower or bathe. However, make sure that the wound is not soaked in water until after the sutures or staples have been removed.  Clean the wound one time each day or as told by your  health care provider:  Wash the wound with soap and water.  Rinse the wound with water to remove all soap.  Pat the wound dry with a clean towel. Do not rub the wound.  After cleaning the wound, apply a thin layer of antibiotic ointmentas told by your health care provider. This will help to prevent infection and keep the dressing from sticking to the wound.  Have the sutures or staples removed as told by your health care provider. If skin adhesive strips were used:  Keep the wound clean and dry.  If you were given a bandage (dressing), you should change it at least one time per day or as told by your health care provider. You should also change it if it becomes dirty or wet.  Do not get the skin adhesive strips wet. You may shower or bathe, but be careful to keep the wound dry.  If the wound gets wet, pat it dry with a clean towel. Do not rub the wound.  Skin adhesive strips fall off on their own. You may trim the strips as the wound heals. Do not remove skin adhesive strips that are still stuck to the wound. They will fall off in time. If skin glue was used:  Try to keep the wound dry, but you may briefly wet it in the shower or bath. Do not soak the wound in water, such as by swimming.  After you have showered or bathed, gently pat the wound dry with a clean towel. Do not rub the wound.  Do not do any activities that will make you sweat heavily until the skin glue has fallen off on its own.  Do not apply liquid, cream, or ointment medicine to the wound while the skin glue is in place. Using those may loosen the film before the wound has healed.  If you were given a bandage (dressing), you should change it at least one time per day or as told by your health care provider. You should also change it if it becomes dirty or wet.  If a dressing is placed over the wound, be careful not to apply tape directly over the skin glue. Doing that may cause the glue to be pulled off before the  wound has healed.  Do not pick at the glue. The skin glue usually remains in place for 5-10 days, then it falls off of the skin. General Instructions  Take over-the-counter and prescription medicines only as told by your health care provider.  If you were prescribed an antibiotic medicine or ointment, take or apply it as told by your doctor. Do not stop using it even if your condition improves.  To help prevent scarring, make sure to cover your wound with sunscreen whenever you are outside after stitches are removed, after adhesive strips are removed, or when glue remains in place and the wound is healed. Make sure to wear a sunscreen of at least 30 SPF.  Do not scratch or pick at the wound.  Keep all follow-up  visits as told by your health care provider. This is important.  Check your wound every day for signs of infection. Watch for:  Redness, swelling, or pain.  Fluid, blood, or pus.  Raise (elevate) the injured area above the level of your heart while you are sitting or lying down, if possible. SEEK MEDICAL CARE IF:  You received a tetanus shot and you have swelling, severe pain, redness, or bleeding at the injection site.  You have a fever.  A wound that was closed breaks open.  You notice a bad smell coming from your wound or your dressing.  You notice something coming out of the wound, such as wood or glass.  Your pain is not controlled with medicine.  You have increased redness, swelling, or pain at the site of your wound.  You have fluid, blood, or pus coming from your wound.  You notice a change in the color of your skin near your wound.  You need to change the dressing frequently due to fluid, blood, or pus draining from the wound.  You develop a new rash.  You develop numbness around the wound. SEEK IMMEDIATE MEDICAL CARE IF:  You develop severe swelling around the wound.  Your pain suddenly increases and is severe.  You develop painful lumps near the  wound or on skin that is anywhere on your body.  You have a red streak going away from your wound.  The wound is on your hand or foot and you cannot properly move a finger or toe.  The wound is on your hand or foot and you notice that your fingers or toes look pale or bluish.   This information is not intended to replace advice given to you by your health care provider. Make sure you discuss any questions you have with your health care provider.   Document Released: 05/17/2005 Document Revised: 10/01/2014 Document Reviewed: 05/13/2014 Elsevier Interactive Patient Education [employer]2016 Else[employer] Inc.

## 2015-04-23 NOTE — ED Notes (Signed)
Hit left knee on "something" in bedroom around 2pm today. Approximate 2 inch laceration on left knee

## 2015-06-28 ENCOUNTER — Emergency Department
Admission: EM | Admit: 2015-06-28 | Discharge: 2015-06-28 | Disposition: A | Discharge status: Home / Self Care | Payer: BLUE CROSS/BLUE SHIELD | Attending: Emergency Medicine | Admitting: Emergency Medicine

## 2015-06-28 ENCOUNTER — Encounter: Payer: Self-pay | Admitting: Emergency Medicine

## 2015-06-28 DIAGNOSIS — Y9289 Other specified places as the place of occurrence of the external cause: Secondary | ICD-10-CM | POA: Insufficient documentation

## 2015-06-28 DIAGNOSIS — Z792 Long term (current) use of antibiotics: Secondary | ICD-10-CM | POA: Diagnosis not present

## 2015-06-28 DIAGNOSIS — X58XXXA Exposure to other specified factors, initial encounter: Secondary | ICD-10-CM | POA: Diagnosis not present

## 2015-06-28 DIAGNOSIS — S4991XA Unspecified injury of right shoulder and upper arm, initial encounter: Secondary | ICD-10-CM | POA: Diagnosis present

## 2015-06-28 DIAGNOSIS — F17210 Nicotine dependence, cigarettes, uncomplicated: Secondary | ICD-10-CM | POA: Insufficient documentation

## 2015-06-28 DIAGNOSIS — Y998 Other external cause status: Secondary | ICD-10-CM | POA: Insufficient documentation

## 2015-06-28 DIAGNOSIS — S46911A Strain of unspecified muscle, fascia and tendon at shoulder and upper arm level, right arm, initial encounter: Secondary | ICD-10-CM | POA: Diagnosis not present

## 2015-06-28 DIAGNOSIS — M25511 Pain in right shoulder: Secondary | ICD-10-CM

## 2015-06-28 DIAGNOSIS — Y9384 Activity, sleeping: Secondary | ICD-10-CM | POA: Insufficient documentation

## 2015-06-28 HISTORY — DX: Unspecified disorder of synovium and tendon, unspecified shoulder: M67.919

## 2015-06-28 MED ORDER — CYCLOBENZAPRINE HCL 5 MG PO TABS: 5.0000 mg | ORAL_TABLET | Freq: Three times a day (TID) | ORAL | Status: DC | PRN

## 2015-06-28 MED ORDER — NAPROXEN 500 MG PO TBEC: 500.0000 mg | DELAYED_RELEASE_TABLET | Freq: Two times a day (BID) | ORAL | Status: DC

## 2015-06-28 NOTE — ED Notes (Signed)
Patient reports "jumped" out of sleep and felt "tearing and popping" in right shoulder couple days ago.  Reports hx of rotator cuff injury to same shoulder.

## 2015-06-28 NOTE — Discharge Instructions (Signed)
Shoulder Pain The shoulder is the joint that connects your arm to your body. Muscles and band-like tissues that connect bones to muscles (tendons) hold the joint together. Shoulder pain is felt if an injury or medical problem affects one or more parts of the shoulder. HOME CARE   Put ice on the sore area.  Put ice in a plastic bag.  Place a towel between your skin and the bag.  Leave the ice on for 15-20 minutes, 03-04 times a day for the first 2 days.  Stop using cold packs if they do not help with the pain.  If you were given something to keep your shoulder from moving (sling; shoulder immobilizer), wear it as told. Only take it off to shower or bathe.  Move your arm as little as possible, but keep your hand moving to prevent puffiness (swelling).  Squeeze a soft ball or foam pad as much as possible to help prevent swelling.  Take medicine as told by your doctor. GET HELP IF:  You have progressing new pain in your arm, hand, or fingers.  Your hand or fingers get cold.  Your medicine does not help lessen your pain. GET HELP RIGHT AWAY IF:   Your arm, hand, or fingers are numb or tingling.  Your arm, hand, or fingers are puffy (swollen), painful, or turn white or blue. MAKE SURE YOU:   Understand these instructions.  Will watch your condition.  Will get help right away if you are not doing well or get worse.   This information is not intended to replace advice given to you by your health care provider. Make sure you discuss any questions you have with your health care provider.   Document Released: 11/03/2007 Document Revised: 06/07/2014 Document Reviewed: 09/09/2014 Elsevier Interactive Patient Education [employer]   Your exam shows some inflammation and pain to the rotator cuff of the right shoulder. You should follow-up with Dedra Skeens, PA-C on Monday, June 30, 2015 at 3:15 as you have stated. Apply ice to the shoulder. Continue to dose your Naprosyn and  Flexeril which have been previously prescribed.

## 2015-06-28 NOTE — ED Provider Notes (Signed)
[facility] Emergency Department Provider Note ____________________________________________  Time seen: 2050  I have reviewed the triage vital signs and the nursing notes.  HISTORY  Chief Complaint  Shoulder Pain  HPI Debra Richmond is a 25 y.o. female presents to the ED for evaluation of pain to the right shoulder, with onset 2 nights prior.She is right-hand-dominant female who describes being the sound asleep in her bed, when she was suddenly awakened by the sound of her dog choking at the foot of the bed. She claims as she reached up with her right arm and grabbed the dog immediately, causing pain to the anterior aspect of the shoulder. Since that time she's had increased pain and disability to the shoulder. She describes "tearing and popping" in the right shoulder at the time of the accident. She gives a history of right rotator cuff tendinitis treated 2 years prior with a single cortisone injection. Since that time she has been dosing Naprosyn without significant benefit and is also been using a previously prescribed arm sling. She rates her pain with shortness 7/10 in triage. She denies any distal paresthesias, grip changes, or swelling. She claims the pain was so severe and caused her to miss work yesterday and today. She is requesting a work note for today and tomorrow, and states she has appointment scheduled for Monday to see Dedra Skeens, PA-C in the clinic on Monday at 3:15 PM.  Past Medical History  Diagnosis Date  . GERD (gastroesophageal reflux disease)   . PCOD (polycystic ovarian disease)   . Rotator cuff disorder     right shoulder    There are no active problems to display for this patient.   Past Surgical History  Procedure Laterality Date  . Cholecystectomy      Current Outpatient Rx  Name  Route  Sig  Dispense  Refill  . mupirocin ointment (BACTROBAN) 2 %   Topical   Apply 1 application topically 3 (three) times daily.   22 g   0   .  ondansetron (ZOFRAN ODT) 8 MG disintegrating tablet   Oral   Take 1 tablet (8 mg total) by mouth every 8 (eight) hours as needed for nausea or vomiting.   20 tablet   0   . predniSONE (DELTASONE) 10 MG tablet      Take 6 tablets  today, on day 2 take 5 tablets, day 3 take 4 tablets, day 4 take 3 tablets, day 5 take  2 tablets and 1 tablet the last day   21 tablet   0    Allergies Dilaudid; Garlic; Prednisone; and Sulfa antibiotics  No family history on file.  Social History Social History  Substance Use Topics  . Smoking status: Current Every Day Smoker -- 1.00 packs/day    Types: Cigarettes  . Smokeless tobacco: Never Used  . Alcohol Use: Yes    Review of Systems  Constitutional: Negative for fever. Eyes: Negative for visual changes. ENT: Negative for sore throat. Cardiovascular: Negative for chest pain. Respiratory: Negative for shortness of breath. Gastrointestinal: Negative for abdominal pain, vomiting and diarrhea. Genitourinary: Negative for dysuria. Musculoskeletal: Negative for back pain. Right shoulder pain as above Skin: Negative for rash. Neurological: Negative for headaches, focal weakness or numbness. ____________________________________________  PHYSICAL EXAM:  VITAL SIGNS: ED Triage Vitals  Enc Vitals Group     BP 06/28/15 1957 132/76 mmHg     Pulse Rate 06/28/15 1957 81     Resp 06/28/15 1957 20  Temp 06/28/15 1957 98.3 F (36.8 C)     Temp Source 06/28/15 1957 Oral     SpO2 06/28/15 1957 100 %     Weight 06/28/15 1957 145 lb (65.772 kg)     Height 06/28/15 1957  (1.651 m)     Head Cir --      Peak Flow --      Pain Score 06/28/15 1958 7     Pain Loc --      Pain Edu? --      Excl. in GC? --    Constitutional: Alert and oriented. Well appearing and in no distress. Head: Normocephalic and atraumatic.      Eyes: Conjunctivae are normal. PERRL. Normal extraocular movements      Ears: Canals clear. TMs intact bilaterally.    Nose: No congestion/rhinorrhea.   Mouth/Throat: Mucous membranes are moist.   Neck: Supple. No thyromegaly. Hematological/Lymphatic/Immunological: No cervical lymphadenopathy. Cardiovascular: Normal rate, regular rhythm.  Respiratory: Normal respiratory effort. No wheezes/rales/rhonchi. Gastrointestinal: Soft and nontender. No distention. Musculoskeletal: Right shoulder without obvious deformity once the patient was able to remove the arm sling and undressed to be examined. No obvious deformity, sulcus sign, dislocation, or effusion is noted to the right shoulder. Patient without tenderness to palpation over the normal bony prominences. She is without tenderness to palp over the biceps tendon groove. She is able to demonstrate normal biceps and triceps muscle strength on exam. She is able to exhibit full active range of motion with extension and abduction. Normal rotator cuff testing without deficit. Negative empty can sign.  Nontender with normal range of motion in all other extremities.  Neurologic:  Normal gait without ataxia. Normal speech and language. No gross focal neurologic deficits are appreciated. Skin:  Skin is warm, dry and intact. No rash noted. Psychiatric: Mood and affect are normal. Patient exhibits appropriate insight and judgment. ____________________________________________  INITIAL IMPRESSION / ASSESSMENT AND PLAN / ED COURSE  Right shoulder pain without evidence of rotator cuff injury. Right shoulder strain without impingement. Will discharge with prescriptions for EC Naprosyn and Flexeril. Patient will follow-up with Dedra Skeens, PA-C, as scheduled. Work note for today and tomorrow are provided. She will apply ice to reduce symptoms.  ____________________________________________  FINAL CLINICAL IMPRESSION(S) / ED DIAGNOSES  Final diagnoses:  Shoulder pain, acute, right  Shoulder strain, right, initial encounter      Lissa Hoard, PA-C 06/28/15  2133  Jennye Moccasin, MD 06/28/15 2155

## 2015-07-08 ENCOUNTER — Ambulatory Visit
Admission: EM | Admit: 2015-07-08 | Discharge: 2015-07-08 | Disposition: A | Discharge status: Home / Self Care | Payer: BLUE CROSS/BLUE SHIELD | Attending: Family Medicine | Admitting: Family Medicine

## 2015-07-08 DIAGNOSIS — L409 Psoriasis, unspecified: Secondary | ICD-10-CM | POA: Diagnosis not present

## 2015-07-08 DIAGNOSIS — L03811 Cellulitis of head [any part, except face]: Secondary | ICD-10-CM

## 2015-07-08 MED ORDER — DOXYCYCLINE HYCLATE 100 MG PO CAPS: 100.0000 mg | ORAL_CAPSULE | Freq: Two times a day (BID) | ORAL | Status: DC

## 2015-07-08 NOTE — ED Provider Notes (Signed)
CSN: [id number]     Arrival date & time 07/08/15  1351 History   First MD Initiated Contact with Patient 07/08/15 1511     Chief Complaint  Patient presents with  . 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 Notes recent flare of psoriasis which is common in winter and when she is under stress.  Facing pending surgery for right shoulder issues-excited but anxious.  2 days ago noted "2 large bumps " on base of skull , tender and swollen -concerned   Allergy to sulfa drugs On prednisone  Past Medical History  Diagnosis Date  . GERD (gastroesophageal reflux disease)   . PCOD (polycystic ovarian disease)   . Rotator cuff disorder     right shoulder   Past Surgical History  Procedure Laterality Date  . Cholecystectomy     History reviewed. No pertinent family history. Social History  Substance Use Topics  . Smoking status: Current Every Day Smoker -- 0.50 packs/day    Types: Cigarettes  . Smokeless tobacco: Never Used  . Alcohol Use: Yes     Comment: rarely   OB History    No data available     Review of Systems   Constitutional: no fever. Baseline level of activity. Eyes: No visual changes. No red eyes/discharge. ENT:No sore throat. No pulling at ears. Cardiovascular:Negative for chest pain/palpitations Respiratory: Negative for shortness of breath Gastrointestinal: No abdominal pain. No nausea,vomiting.No Diarrhea.No constipation. Genitourinary: Negative for dysuria.Normal urination. Musculoskeletal: Negative for back pain. FROM extremities without pain-except right rotator issues currently under care Skin: Positive for active psoriatic lesions right occiput Neurological: Negative for headache, focal weakness or numbness    Allergies  Dilaudid; Garlic; Prednisone; and Sulfa antibiotics  Home Medications    Prior to Admission medications   Medication Sig Start Date End Date Taking? Authorizing Provider  cyclobenzaprine (FLEXERIL) 5 MG tablet Take 1 tablet (5 mg total) by mouth 3 (three) times daily as needed for muscle spasms. 06/28/15  Yes Jenise V Bacon Menshew, PA-C  naproxen (EC NAPROSYN) 500 MG EC tablet Take 1 tablet (500 mg total) by mouth 2 (two) times daily with a meal. 06/28/15  Yes Jenise V Bacon Menshew, PA-C  doxycycline (VIBRAMYCIN) 100 MG capsule Take 1 capsule (100 mg total) by mouth 2 (two) times daily. 07/08/15   Rae Halsted, PA-C  mupirocin ointment (BACTROBAN) 2 % Apply 1 application topically 3 (three) times daily. 12/11/14   Lutricia Feil, PA-C  ondansetron (ZOFRAN ODT) 8 MG disintegrating tablet Take 1 tablet (8 mg total) by mouth every 8 (eight) hours as needed for nausea or vomiting. 01/02/15   Sharman Cheek, MD  predniSONE (DELTASONE) 10 MG tablet Take 6 tablets  today, on day 2 take 5 tablets, day 3 take 4 tablets, day 4 take 3 tablets, day 5 take  2 tablets and 1 tablet the last day 11/26/14   Tommi Rumps, PA-C   Meds Ordered and Administered this Visit  Medications - No data to display  BP 120/83 mmHg  Pulse 74  Temp(Src) 97.8 F (36.6 C) (Tympanic)  Resp 16  Ht  (1.651 m)  Wt 148 lb (67.132 kg)  BMI 24.63 kg/m2  SpO2 100%  LMP 07/05/2015 (Exact Date) No data found.   Physical Exam   VS noted, WNL GENERAL : mild discomfort HEENT: no pharyngeal erythema,no exudate, no erythema  of TMs, active psoriasis scalp right occiput- areas adjacent are mildly inflamed and tender to touch- area of concern is occipital prominence And she is reasurred after palpating , tissue tenderness localized inflammatory response RESP: CTA  B , no wheezing, no accessory muscle use CARD: RRR ABD: Not distended NEURO: Good attention, good recall, no gross neuro defecit PSYCH: speech and behavior appropriate  ED Course  Procedures (including critical care time)  Labs  Review Labs Reviewed - No data to display  Imaging Review No results found.      MDM   1. Abscess or cellulitis of scalp   2. Psoriasis of scalp     Plan: diagnosis reviewed with patient/  Rx as per orders;  benefits, risks, potential side effects reviewed   Recommend supportive treatment with cyclic tylenol and ibuprofen Seek additional medical care if symptoms worsen or are not improving Some local Dermatolgy offices info given   Discharge Medication List as of 07/08/2015  3:35 PM    START taking these medications   Details  doxycycline (VIBRAMYCIN) 100 MG capsule Take 1 capsule (100 mg total) by mouth 2 (two) times daily., Starting 07/08/2015, Until Discontinued, Print          Rae Halsted, PA-C 07/10/15 2251

## 2015-07-08 NOTE — ED Notes (Signed)
Noted 2-3 days ago to have "knots" posterior upper neck. Very painful and "feels pressure"

## 2015-07-08 NOTE — Discharge Instructions (Signed)
Mildly infected area around active psoriasis---keep hands away - no tension with pony tail -   Selsun Blue shampoo  Contact Dermatology for evaluation  Psoriasis Psoriasis is a long-term (chronic) condition of skin inflammation. It occurs because your immune system causes skin cells to form too quickly. As a result, too many skin cells grow and create raised, red patches (plaques) that look silvery on your skin. Plaques may appear anywhere on your body. They can be any size or shape. Psoriasis can come and go. The condition varies from mild to very severe. It cannot be passed from one person to another (not contagious).  CAUSES  The cause of psoriasis is not known, but certain factors can make the condition worse. These include:   Damage or trauma to the skin, such as cuts, scrapes, sunburn, and dryness.  Lack of sunlight.  Certain medicines.  Alcohol.  Tobacco use.  Stress.  Infections caused by bacteria or viruses. RISK FACTORS This condition is more likely to develop in:  People with a family history of psoriasis.  People who are Caucasian.  People who are between the ages of 15-1 and 66-58 years old. SYMPTOMS  There are five different types of psoriasis. You can have more than one type of psoriasis during your life. Types are:   Plaque.  Guttate.  Inverse.  Pustular.  Erythrodermic. Each type of psoriasis has different symptoms.   Plaque psoriasis symptoms include red, raised plaques with a silvery white coating (scale). These plaques may be itchy. Your nails may be pitted and crumbly or fall off.  Guttate psoriasis symptoms include small red spots that often show up on your trunk, arms, and legs. These spots may develop after you have been sick, especially with strep throat.  Inverse psoriasis symptoms include plaques in your underarm area, under your breasts, or on your genitals, groin, or buttocks.  Pustular psoriasis symptoms include pus-filled bumps that  are painful, red, and swollen on the palms of your hands or the soles of your feet. You also may feel exhausted, feverish, weak, or have no appetite.  Erythrodermic psoriasis symptoms include bright red skin that may look burned. You may have a fast heartbeat and a body temperature that is too high or too low. You may be itchy or in pain. DIAGNOSIS  Your health care provider may suspect psoriasis based on your symptoms and family history. Your health care provider will also do a physical exam. This may include a procedure to remove a tissue sample (biopsy) for testing. You may also be referred to a health care provider who specializes in skin diseases (dermatologist).  TREATMENT There is no cure for this condition, but treatment can help manage it. Goals of treatment include:   Helping your skin heal.  Reducing itching and inflammation.  Slowing the growth of new skin cells.  Helping your immune system respond better to your skin. Treatment varies, depending on the severity of your condition. Treatment may include:   Creams or ointments.  Ultraviolet ray exposure (light therapy). This may include natural sunlight or light therapy in a medical office.  Medicines (systemic therapy). These medicines can help your body better manage skin cell turnover and inflammation. They may be used along with light therapy or ointments. You may also get antibiotic medicines if you have an infection. HOME CARE INSTRUCTIONS Skin Care  Moisturize your skin as needed. Only use moisturizers that have been approved by your health care provider.   Apply cool compresses to the  affected areas.   Do not scratch your skin.  Lifestyle  Do not use tobacco products. This includes cigarettes, chewing tobacco, and e-cigarettes. If you need help quitting, ask your health care provider.  Drink little or no alcohol.   Try techniques for stress reduction, such as meditation or yoga.  Get exposure to the sun  as told by your health care provider. Do not get sunburned.   Consider joining a psoriasis support group.  Medicines  Take or use over-the-counter and prescription medicines only as told by your health care provider.  If you were prescribed an antibiotic, take or use it as told by your health care provider. Do not stop taking the antibiotic even if your condition starts to improve. General Instructions  Keep a journal to help track what triggers an outbreak. Try to avoid any triggers.   See a counselor or social worker if feelings of sadness, frustration, and hopelessness about your condition are interfering with your work and relationships.  Keep all follow-up visits as told by your health care provider. This is important. SEEK MEDICAL CARE IF:  Your pain gets worse.  You have increasing redness or warmth in the affected areas.   You have new or worsening pain or stiffness in your joints.  Your nails start to break easily or pull away from the nail bed.   You have a fever.   You feel depressed.   This information is not intended to replace advice given to you by your health care provider. Make sure you discuss any questions you have with your health care provider.   Document Released: 05/14/2000 Document Revised: 02/05/2015 Document Reviewed: 10/02/2014 Elsevier Interactive Patient Education [employer]

## 2015-07-10 ENCOUNTER — Encounter: Payer: Self-pay | Admitting: Physician Assistant

## 2015-07-22 ENCOUNTER — Other Ambulatory Visit: Payer: Self-pay | Admitting: Orthopedic Surgery

## 2015-07-22 DIAGNOSIS — S4431XD Injury of axillary nerve, right arm, subsequent encounter: Secondary | ICD-10-CM

## 2015-07-22 DIAGNOSIS — M25511 Pain in right shoulder: Secondary | ICD-10-CM

## 2015-07-22 DIAGNOSIS — M25311 Other instability, right shoulder: Secondary | ICD-10-CM

## 2015-08-07 ENCOUNTER — Ambulatory Visit
Admission: RE | Admit: 2015-08-07 | Discharge: 2015-08-07 | Disposition: A | Discharge status: Home / Self Care | Payer: BLUE CROSS/BLUE SHIELD | Source: Ambulatory Visit | Attending: Orthopedic Surgery | Admitting: Orthopedic Surgery

## 2015-08-07 DIAGNOSIS — X58XXXD Exposure to other specified factors, subsequent encounter: Secondary | ICD-10-CM | POA: Diagnosis not present

## 2015-08-07 DIAGNOSIS — M25311 Other instability, right shoulder: Secondary | ICD-10-CM

## 2015-08-07 DIAGNOSIS — S4431XD Injury of axillary nerve, right arm, subsequent encounter: Secondary | ICD-10-CM

## 2015-08-07 DIAGNOSIS — M25511 Pain in right shoulder: Secondary | ICD-10-CM | POA: Diagnosis present

## 2015-08-07 DIAGNOSIS — M7591 Shoulder lesion, unspecified, right shoulder: Secondary | ICD-10-CM | POA: Diagnosis not present

## 2015-08-27 DIAGNOSIS — M7581 Other shoulder lesions, right shoulder: Secondary | ICD-10-CM

## 2015-08-27 DIAGNOSIS — S46911A Strain of unspecified muscle, fascia and tendon at shoulder and upper arm level, right arm, initial encounter: Secondary | ICD-10-CM | POA: Insufficient documentation

## 2015-08-27 DIAGNOSIS — M778 Other enthesopathies, not elsewhere classified: Secondary | ICD-10-CM | POA: Insufficient documentation

## 2015-09-24 ENCOUNTER — Ambulatory Visit
Admission: EM | Admit: 2015-09-24 | Discharge: 2015-09-24 | Disposition: A | Discharge status: Home / Self Care | Payer: BLUE CROSS/BLUE SHIELD | Attending: Family Medicine | Admitting: Family Medicine

## 2015-09-24 ENCOUNTER — Encounter: Payer: Self-pay | Admitting: *Deleted

## 2015-09-24 DIAGNOSIS — L089 Local infection of the skin and subcutaneous tissue, unspecified: Secondary | ICD-10-CM | POA: Diagnosis not present

## 2015-09-24 MED ORDER — CLINDAMYCIN HCL 300 MG PO CAPS: 300.0000 mg | ORAL_CAPSULE | Freq: Three times a day (TID) | ORAL | Status: AC

## 2015-09-24 NOTE — ED Notes (Signed)
Scalp infection to occipital region along with generalized rash. Hx of psoriasis.

## 2015-09-24 NOTE — ED Provider Notes (Signed)
CSN: [id number]     Arrival date & time 09/24/15  1738 History   First MD Initiated Contact with Patient 09/24/15 1829     Chief Complaint  Patient presents with  . Hair/Scalp Problem   (Consider location/radiation/quality/duration/timing/severity/associated sxs/prior Treatment) HPI: She presents today with persistent scalp issues. Patient states that she was seen here back in February and was given Doxycycline for her scalp infection. Patient states that she has a history of psoriasis. She states that the psoriasis only affects the scalp. She denies any fever or chills. She has not seen a dermatologist since her last visit here in February. She does describe some discharge from the site being of color. She has had flakiness of the scalp in that area.  Past Medical History  Diagnosis Date  . GERD (gastroesophageal reflux disease)   . PCOD (polycystic ovarian disease)   . Rotator cuff disorder     right shoulder   Past Surgical History  Procedure Laterality Date  . Cholecystectomy     History reviewed. No pertinent family history. Social History  Substance Use Topics  . Smoking status: Current Every Day Smoker -- 0.50 packs/day    Types: Cigarettes  . Smokeless tobacco: Never Used  . Alcohol Use: Yes     Comment: rarely   OB History    No data available     Review of Systems: Negative except mentioned above.   Allergies  Dilaudid; Garlic; Prednisone; and Sulfa antibiotics  Home Medications   Prior to Admission medications   Medication Sig Start Date End Date Taking? Authorizing Provider  cyclobenzaprine (FLEXERIL) 5 MG tablet Take 1 tablet (5 mg total) by mouth 3 (three) times daily as needed for muscle spasms. 06/28/15   Jenise V Bacon Menshew, PA-C  doxycycline (VIBRAMYCIN) 100 MG capsule Take 1 capsule (100 mg total) by mouth 2 (two) times daily. 07/08/15   Rae HalstedLaurie W Lee, PA-C  mupirocin ointment (BACTROBAN) 2 % Apply 1 application topically 3 (three) times daily. 12/11/14    Lutricia FeilWilliam P Roemer, PA-C  naproxen (EC NAPROSYN) 500 MG EC tablet Take 1 tablet (500 mg total) by mouth 2 (two) times daily with a meal. 06/28/15   Jenise V Bacon Menshew, PA-C  ondansetron (ZOFRAN ODT) 8 MG disintegrating tablet Take 1 tablet (8 mg total) by mouth every 8 (eight) hours as needed for nausea or vomiting. 01/02/15   Sharman CheekPhillip Stafford, MD  predniSONE (DELTASONE) 10 MG tablet Take 6 tablets  today, on day 2 take 5 tablets, day 3 take 4 tablets, day 4 take 3 tablets, day 5 take  2 tablets and 1 tablet the last day 11/26/14   Tommi Rumpshonda L Summers, PA-C   Meds Ordered and Administered this Visit  Medications - No data to display  BP 128/81 mmHg  Pulse 79  Temp(Src) 98 F (36.7 C) (Oral)  Resp 16  Ht 5\' 4"  (1.626 m)  Wt 135 lb (61.236 kg)  BMI 23.16 kg/m2  SpO2 100%  LMP 09/24/2015 (Exact Date) No data found.   Physical Exam   GENERAL: NAD HEENT: no pharyngeal erythema, no exudate RESP: CTA B CARD: RRR SKIN: occiput area of scalp with flakiness, moderate erythema, no pustules noted, minimal discharge, mildly tender NEURO: CN II-XII grossly intact   ED Course  Procedures (including critical care time)  Labs Review Labs Reviewed - No data to display  Imaging Review No results found.   MDM   A/P: Scalp Infection- discussed with patient that I would prescribe her  Clindamycin at this time since she had minimal effect with the Doxycycline and is allergic to Sulfa. Discussed also taking a probiotic. I would recommend that she follow-up with dermatology within the week. She has seen by Dr. Rhea Belton in the past for other skin issues. I have asked that she call her office tomorrow. If any worsening symptoms she should seek medical attention as discussed. I've asked that she keep the area dry and clean and avoid picking or peeling the skin.   Jolene Provost, MD 09/24/15 Ebony Cargo

## 2015-11-10 ENCOUNTER — Emergency Department
Admission: EM | Admit: 2015-11-10 | Discharge: 2015-11-10 | Disposition: A | Discharge status: Home / Self Care | Payer: BLUE CROSS/BLUE SHIELD | Attending: Emergency Medicine | Admitting: Emergency Medicine

## 2015-11-10 ENCOUNTER — Encounter: Payer: Self-pay | Admitting: *Deleted

## 2015-11-10 DIAGNOSIS — Y999 Unspecified external cause status: Secondary | ICD-10-CM | POA: Diagnosis not present

## 2015-11-10 DIAGNOSIS — Y929 Unspecified place or not applicable: Secondary | ICD-10-CM | POA: Insufficient documentation

## 2015-11-10 DIAGNOSIS — Z79899 Other long term (current) drug therapy: Secondary | ICD-10-CM | POA: Diagnosis not present

## 2015-11-10 DIAGNOSIS — W25XXXA Contact with sharp glass, initial encounter: Secondary | ICD-10-CM | POA: Diagnosis not present

## 2015-11-10 DIAGNOSIS — Y9389 Activity, other specified: Secondary | ICD-10-CM | POA: Diagnosis not present

## 2015-11-10 DIAGNOSIS — F17210 Nicotine dependence, cigarettes, uncomplicated: Secondary | ICD-10-CM | POA: Diagnosis not present

## 2015-11-10 DIAGNOSIS — S81812A Laceration without foreign body, left lower leg, initial encounter: Secondary | ICD-10-CM | POA: Diagnosis present

## 2015-11-10 MED ORDER — LIDOCAINE-EPINEPHRINE (PF) 1 %-1:200000 IJ SOLN: 30.0000 mL | Freq: Once | INTRAMUSCULAR | Status: DC

## 2015-11-10 MED ORDER — LIDOCAINE-EPINEPHRINE (PF) 1 %-1:200000 IJ SOLN
INTRAMUSCULAR | Status: AC
  Filled 2015-11-10: qty 30

## 2015-11-10 MED ORDER — BACITRACIN ZINC 500 UNIT/GM EX OINT
1.0000 "application " | TOPICAL_OINTMENT | Freq: Two times a day (BID) | CUTANEOUS | Status: DC
  Filled 2015-11-10: qty 0.9

## 2015-11-10 NOTE — ED Notes (Signed)
Pt. Verbalizes understanding of d/c instructions, and follow-up. VS stable.  Pt. In NAD at time of d/c and denies concerns. Pt. Out of the unit by wheelchair with friend at the side.

## 2015-11-10 NOTE — ED Notes (Signed)
Pt presents to ED with 2cm/5cm laceration to left mid calf. States was taking trash out felt the cut. Pt does not know what cut her, thinks it might been a glass. Bleeding controlled.

## 2015-11-10 NOTE — ED Notes (Signed)
Pt reports while taking trash out this evening she felt something cut her left leg. Pt reports she believes it was a wine glass but reports she is unsure. Pt in NAD at this time. Bleeding controlled with gauze. Pt reports last tetanus shot was 2015.

## 2015-11-10 NOTE — ED Provider Notes (Signed)
[facility] Emergency Department Provider Note  ____________________________________________  Time seen: Approximately 10:09 PM  I have reviewed the triage vital signs and the nursing notes.   HISTORY  Chief Complaint Extremity Laceration   HPI Quinisha Mould is a 25 y.o. female who presents to the emergency department for evaluation of laceration to the left lower extremity  Past Medical History  Diagnosis Date  . GERD (gastroesophageal reflux disease)   . PCOD (polycystic ovarian disease)   . Rotator cuff disorder     right shoulder    There are no active problems to display for this patient.   Past Surgical History  Procedure Laterality Date  . Cholecystectomy      Current Outpatient Rx  Name  Route  Sig  Dispense  Refill  . cyclobenzaprine (FLEXERIL) 5 MG tablet   Oral   Take 1 tablet (5 mg total) by mouth 3 (three) times daily as needed for muscle spasms.   15 tablet   0   . doxycycline (VIBRAMYCIN) 100 MG capsule   Oral   Take 1 capsule (100 mg total) by mouth 2 (two) times daily.   20 capsule   0   . mupirocin ointment (BACTROBAN) 2 %   Topical   Apply 1 application topically 3 (three) times daily.   22 g   0   . naproxen (EC NAPROSYN) 500 MG EC tablet   Oral   Take 1 tablet (500 mg total) by mouth 2 (two) times daily with a meal.   30 tablet   0   . ondansetron (ZOFRAN ODT) 8 MG disintegrating tablet   Oral   Take 1 tablet (8 mg total) by mouth every 8 (eight) hours as needed for nausea or vomiting.   20 tablet   0   . predniSONE (DELTASONE) 10 MG tablet      Take 6 tablets  today, on day 2 take 5 tablets, day 3 take 4 tablets, day 4 take 3 tablets, day 5 take  2 tablets and 1 tablet the last day   21 tablet   0     Allergies Dilaudid; Garlic; Prednisone; and Sulfa antibiotics  History reviewed. No pertinent family history.  Social History Social History  Substance Use Topics  . Smoking status: Current  Every Day Smoker -- 0.50 packs/day    Types: Cigarettes  . Smokeless tobacco: Never Used  . Alcohol Use: Yes     Comment: rarely    Review of Systems  Constitutional: Negative for fever/chills Respiratory: Negative for shortness of breath. Musculoskeletal: Negative for pain. Skin: Positive for laceration. Neurological: Negative for headaches, focal weakness or numbness. ____________________________________________   PHYSICAL EXAM:  VITAL SIGNS: ED Triage Vitals  Enc Vitals Group     BP 11/10/15 2122 127/77 mmHg     Pulse Rate 11/10/15 2122 77     Resp 11/10/15 2122 18     Temp 11/10/15 2122 97.9 F (36.6 C)     Temp Source 11/10/15 2122 Oral     SpO2 11/10/15 2122 100 %     Weight 11/10/15 2123 143 lb (64.864 kg)     Height 11/10/15 2123  (1.626 m)     Head Cir --      Peak Flow --      Pain Score 11/10/15 2122 10     Pain Loc --      Pain Edu? --      Excl. in GC? --  Constitutional: Alert and oriented. Well appearing and in no acute distress. Eyes: Conjunctivae are normal. EOMI. Nose: No congestion/rhinnorhea. Mouth/Throat: Mucous membranes are moist.   Neck: No stridor. Cardiovascular: Good peripheral circulation. Respiratory: Normal respiratory effort.  No retractions.  Musculoskeletal: FROM throughout. Neurologic:  Normal speech and language. No gross focal neurologic deficits are appreciated. Skin:  5 cm laceration to the left medial, mid calf.  ____________________________________________   LABS (all labs ordered are listed, but only abnormal results are displayed)  Labs Reviewed - No data to display ____________________________________________  EKG   ____________________________________________  RADIOLOGY  Not indicated ____________________________________________   PROCEDURES  Procedure(s) performed:   LACERATION REPAIR Performed by: Kem Boroughsari Tony Friscia Authorized by: Kem Boroughsari Weslie Pretlow Consent: Verbal consent obtained. Risks and  benefits: risks, benefits and alternatives were discussed Consent given by: patient Patient identity confirmed: provided demographic data Prepped and Draped in normal sterile fashion Wound explored  Laceration Location: left lower extremity  Laceration Length: 5 cm  No Foreign Bodies seen or palpated  Anesthesia: local infiltration  Local anesthetic: lidocaine 1% with epinephrine  Anesthetic total: 4 ml  Irrigation method: syringe Amount of cleaning: standard  Skin closure: 5-0 Vicryl/ 5-0 Ethilon  Number of sutures: 3/6  Technique: simple interrupted.  Patient tolerance: Patient tolerated the procedure well with no immediate complications.  ____________________________________________   INITIAL IMPRESSION / ASSESSMENT AND PLAN / ED COURSE  Pertinent labs & imaging results that were available during my care of the patient were reviewed by me and considered in my medical decision making (see chart for details).  She will be advised to use antibiotic ointment 2 times per day.  She was advised to follow up with PCP for suture removal in 10 days.  She was also advised to return to the emergency department for symptoms that change or worsen if unable to schedule an appointment.  ____________________________________________   FINAL CLINICAL IMPRESSION(S) / ED DIAGNOSES  Final diagnoses:  Laceration of leg, left, initial encounter       Chinita PesterCari B Peterson Mathey, FNP 11/10/15 2341  Loleta Roseory Forbach, MD 11/11/15 1418

## 2015-11-10 NOTE — Discharge Instructions (Signed)
Laceration Care, Adult  A laceration is a cut that goes through all layers of the skin. The cut also goes into the tissue that is right under the skin. Some cuts heal on their own. Others need to be closed with stitches (sutures), staples, skin adhesive strips, or wound glue. Taking care of your cut lowers your risk of infection and helps your cut to heal better.  HOW TO TAKE CARE OF YOUR CUT  For stitches or staples:  · Keep the wound clean and dry.  · If you were given a bandage (dressing), you should change it at least one time per day or as told by your doctor. You should also change it if it gets wet or dirty.  · Keep the wound completely dry for the first 24 hours or as told by your doctor. After that time, you may take a shower or a bath. However, make sure that the wound is not soaked in water until after the stitches or staples have been removed.  · Clean the wound one time each day or as told by your doctor:    Wash the wound with soap and water.    Rinse the wound with water until all of the soap comes off.    Pat the wound dry with a clean towel. Do not rub the wound.  · After you clean the wound, put a thin layer of antibiotic ointment on it as told by your doctor. This ointment:    Helps to prevent infection.    Keeps the bandage from sticking to the wound.  · Have your stitches or staples removed as told by your doctor.  If your doctor used skin adhesive strips:   · Keep the wound clean and dry.  · If you were given a bandage, you should change it at least one time per day or as told by your doctor. You should also change it if it gets dirty or wet.  · Do not get the skin adhesive strips wet. You can take a shower or a bath, but be careful to keep the wound dry.  · If the wound gets wet, pat it dry with a clean towel. Do not rub the wound.  · Skin adhesive strips fall off on their own. You can trim the strips as the wound heals. Do not remove any strips that are still stuck to the wound. They will  fall off after a while.  If your doctor used wound glue:  · Try to keep your wound dry, but you may briefly wet it in the shower or bath. Do not soak the wound in water, such as by swimming.  · After you take a shower or a bath, gently pat the wound dry with a clean towel. Do not rub the wound.  · Do not do any activities that will make you really sweaty until the skin glue has fallen off on its own.  · Do not apply liquid, cream, or ointment medicine to your wound while the skin glue is still on.  · If you were given a bandage, you should change it at least one time per day or as told by your doctor. You should also change it if it gets dirty or wet.  · If a bandage is placed over the wound, do not let the tape for the bandage touch the skin glue.  · Do not pick at the glue. The skin glue usually stays on for 5-10 days. Then, it   falls off of the skin.  General Instructions   · To help prevent scarring, make sure to cover your wound with sunscreen whenever you are outside after stitches are removed, after adhesive strips are removed, or when wound glue stays in place and the wound is healed. Make sure to wear a sunscreen of at least 30 SPF.  · Take over-the-counter and prescription medicines only as told by your doctor.  · If you were given antibiotic medicine or ointment, take or apply it as told by your doctor. Do not stop using the antibiotic even if your wound is getting better.  · Do not scratch or pick at the wound.  · Keep all follow-up visits as told by your doctor. This is important.  · Check your wound every day for signs of infection. Watch for:    Redness, swelling, or pain.    Fluid, blood, or pus.  · Raise (elevate) the injured area above the level of your heart while you are sitting or lying down, if possible.  GET HELP IF:  · You got a tetanus shot and you have any of these problems at the injection site:    Swelling.    Very bad pain.    Redness.    Bleeding.  · You have a fever.  · A wound that was  closed breaks open.  · You notice a bad smell coming from your wound or your bandage.  · You notice something coming out of the wound, such as wood or glass.  · Medicine does not help your pain.  · You have more redness, swelling, or pain at the site of your wound.  · You have fluid, blood, or pus coming from your wound.  · You notice a change in the color of your skin near your wound.  · You need to change the bandage often because fluid, blood, or pus is coming from the wound.  · You start to have a new rash.  · You start to have numbness around the wound.  GET HELP RIGHT AWAY IF:  · You have very bad swelling around the wound.  · Your pain suddenly gets worse and is very bad.  · You notice painful lumps near the wound or on skin that is anywhere on your body.  · You have a red streak going away from your wound.  · The wound is on your hand or foot and you cannot move a finger or toe like you usually can.  · The wound is on your hand or foot and you notice that your fingers or toes look pale or bluish.     This information is not intended to replace advice given to you by your health care provider. Make sure you discuss any questions you have with your health care provider.     Document Released: 11/03/2007 Document Revised: 10/01/2014 Document Reviewed: 05/13/2014  Elsevier Interactive Patient Education ©2016 [employer]

## 2015-11-19 ENCOUNTER — Ambulatory Visit
Admission: EM | Admit: 2015-11-19 | Discharge: 2015-11-19 | Disposition: A | Discharge status: Home / Self Care | Payer: BLUE CROSS/BLUE SHIELD | Attending: Family Medicine | Admitting: Family Medicine

## 2015-11-19 DIAGNOSIS — L089 Local infection of the skin and subcutaneous tissue, unspecified: Secondary | ICD-10-CM

## 2015-11-19 DIAGNOSIS — T148XXA Other injury of unspecified body region, initial encounter: Principal | ICD-10-CM

## 2015-11-19 DIAGNOSIS — T148 Other injury of unspecified body region: Secondary | ICD-10-CM

## 2015-11-19 MED ORDER — AMOXICILLIN-POT CLAVULANATE 875-125 MG PO TABS
1.0000 | ORAL_TABLET | Freq: Two times a day (BID) | ORAL | Status: DC
Start: 2015-11-19 — End: 2015-12-15

## 2015-11-19 NOTE — ED Notes (Signed)
Patient cut her leg 11/10/2015 and [facility] went [facility] ED and they placed stitches. She presents here today with a cut that is red, painful to touch and has some thick green drainage.

## 2015-11-19 NOTE — ED Provider Notes (Signed)
CSN: [id number]     Arrival date & time 11/19/15  1610 History   First MD Initiated Contact with Patient 11/19/15 1644     Chief Complaint  Patient presents with  . Wound Infection    Left Lower Leg   (Consider location/radiation/quality/duration/timing/severity/associated sxs/prior Treatment) HPI Comments: 25 yo female seen 1 week ago at [facility] ED with left leg laceration, had sutures placed and now presents with a 3 days h/o progressively worsening redness and tenderness at the wound site. Denies any fevers, chills. States today noticed some slight thick drainage from the wound.   The history is provided by the patient.    Past Medical History  Diagnosis Date  . GERD (gastroesophageal reflux disease)   . PCOD (polycystic ovarian disease)   . Rotator cuff disorder     right shoulder   Past Surgical History  Procedure Laterality Date  . Cholecystectomy     History reviewed. No pertinent family history. Social History  Substance Use Topics  . Smoking status: Current Every Day Smoker -- 0.50 packs/day    Types: Cigarettes  . Smokeless tobacco: Never Used  . Alcohol Use: Yes     Comment: rarely   OB History    No data available     Review of Systems  Allergies  Dilaudid; Garlic; Prednisone; and Sulfa antibiotics  Home Medications   Prior to Admission medications   Medication Sig Start Date End Date Taking? Authorizing Provider  amoxicillin-clavulanate (AUGMENTIN) 875-125 MG tablet Take 1 tablet by mouth 2 (two) times daily. 11/19/15   Payton Mccallum, MD  cyclobenzaprine (FLEXERIL) 5 MG tablet Take 1 tablet (5 mg total) by mouth 3 (three) times daily as needed for muscle spasms. 06/28/15   Jenise V Bacon Menshew, PA-C  doxycycline (VIBRAMYCIN) 100 MG capsule Take 1 capsule (100 mg total) by mouth 2 (two) times daily. 07/08/15   Rae Halsted, PA-C  mupirocin ointment (BACTROBAN) 2 % Apply 1 application topically 3 (three) times daily. 12/11/14   Lutricia Feil, PA-C  naproxen  (EC NAPROSYN) 500 MG EC tablet Take 1 tablet (500 mg total) by mouth 2 (two) times daily with a meal. 06/28/15   Jenise V Bacon Menshew, PA-C  ondansetron (ZOFRAN ODT) 8 MG disintegrating tablet Take 1 tablet (8 mg total) by mouth every 8 (eight) hours as needed for nausea or vomiting. 01/02/15   Sharman Cheek, MD  predniSONE (DELTASONE) 10 MG tablet Take 6 tablets  today, on day 2 take 5 tablets, day 3 take 4 tablets, day 4 take 3 tablets, day 5 take  2 tablets and 1 tablet the last day 11/26/14   Tommi Rumps, PA-C   Meds Ordered and Administered this Visit  Medications - No data to display  BP 116/69 mmHg  Pulse 74  Temp(Src) 98 F (36.7 C) (Tympanic)  Resp 16  Ht  (1.626 m)  Wt 140 lb (63.504 kg)  BMI 24.02 kg/m2  SpO2 99%  LMP 10/22/2015 No data found.   Physical Exam  Constitutional: She appears well-developed and well-nourished. No distress.  Skin: She is not diaphoretic.  Wound laceration to left lower leg with sutures in place and surrounding blanchable erythema, warmth and tenderness to palpation  Nursing note and vitals reviewed.   ED Course  Procedures (including critical care time)  Labs Review Labs Reviewed - No data to display  Imaging Review No results found.   Visual Acuity Review  Right Eye Distance:   Left Eye Distance:  Bilateral Distance:    Right Eye Near:   Left Eye Near:    Bilateral Near:         MDM   1. Wound infection [facility](HCC)    Discharge Medication List as of 11/19/2015  4:50 PM    START taking these medications   Details  amoxicillin-clavulanate (AUGMENTIN) 875-125 MG tablet Take 1 tablet by mouth 2 (two) times daily., Starting 11/19/2015, Until Discontinued, Normal       1. diagnosis reviewed with patient 2. rx as per orders above; reviewed possible side effects, interactions, risks and benefits  3. Recommend supportive treatment with warm compresses 4. Follow-up with PCP in 2 days or sooner  prn if symptoms worsen     Payton Mccallumrlando Anniyah Mood, MD 11/19/15 [phone]

## 2015-11-19 NOTE — Discharge Instructions (Signed)

## 2015-12-15 ENCOUNTER — Emergency Department: Payer: BLUE CROSS/BLUE SHIELD

## 2015-12-15 ENCOUNTER — Emergency Department
Admission: EM | Admit: 2015-12-15 | Discharge: 2015-12-15 | Disposition: A | Discharge status: Home / Self Care | Payer: BLUE CROSS/BLUE SHIELD | Attending: Emergency Medicine | Admitting: Emergency Medicine

## 2015-12-15 ENCOUNTER — Encounter: Payer: Self-pay | Admitting: Emergency Medicine

## 2015-12-15 DIAGNOSIS — Y9389 Activity, other specified: Secondary | ICD-10-CM | POA: Insufficient documentation

## 2015-12-15 DIAGNOSIS — S9031XA Contusion of right foot, initial encounter: Secondary | ICD-10-CM | POA: Diagnosis not present

## 2015-12-15 DIAGNOSIS — M79671 Pain in right foot: Secondary | ICD-10-CM | POA: Diagnosis present

## 2015-12-15 DIAGNOSIS — Y929 Unspecified place or not applicable: Secondary | ICD-10-CM | POA: Insufficient documentation

## 2015-12-15 DIAGNOSIS — Y990 Civilian activity done for income or pay: Secondary | ICD-10-CM | POA: Insufficient documentation

## 2015-12-15 DIAGNOSIS — F17210 Nicotine dependence, cigarettes, uncomplicated: Secondary | ICD-10-CM | POA: Insufficient documentation

## 2015-12-15 DIAGNOSIS — W208XXA Other cause of strike by thrown, projected or falling object, initial encounter: Secondary | ICD-10-CM | POA: Diagnosis not present

## 2015-12-15 MED ORDER — MELOXICAM 15 MG PO TABS: 15.0000 mg | ORAL_TABLET | Freq: Every day | ORAL | Status: DC

## 2015-12-15 NOTE — ED Provider Notes (Signed)
[facility] Emergency Department Provider Note   ____________________________________________  Time seen: Approximately 3:07 PM  I have reviewed the triage vital signs and the nursing notes.   HISTORY  Chief Complaint Foot Pain    HPI Debra Richmond is a 25 y.o. female who reports to the ED today following dropping a desk on her right foot. Patient states that she was at work today helping to move a desk when her hands slipped and subsequently dropped the desk on her right foot from about knee high. She states that she was able to put minimal pressure on her foot while walking after the accident. Pain is 6/10 currently. Patient has moderate numbness to the toes. Pain does not radiate up past the knee. She states that she has not noticed any swelling or redness yet.   Past Medical History  Diagnosis Date  . GERD (gastroesophageal reflux disease)   . PCOD (polycystic ovarian disease)   . Rotator cuff disorder     right shoulder    There are no active problems to display for this patient.   Past Surgical History  Procedure Laterality Date  . Cholecystectomy      Current Outpatient Rx  Name  Route  Sig  Dispense  Refill  . meloxicam (MOBIC) 15 MG tablet   Oral   Take 1 tablet (15 mg total) by mouth daily.   30 tablet   0     Allergies Dilaudid; Garlic; Prednisone; and Sulfa antibiotics  No family history on file.  Social History Social History  Substance Use Topics  . Smoking status: Current Every Day Smoker -- 0.50 packs/day    Types: Cigarettes  . Smokeless tobacco: Never Used  . Alcohol Use: Yes     Comment: rarely    Review of Systems Constitutional: No fever/chills Eyes: No visual changes. ENT: No sore throat. Cardiovascular: Denies chest pain. Respiratory: Denies shortness of breath. Gastrointestinal: No abdominal pain.  No nausea, no vomiting.  No diarrhea.  No constipation. Genitourinary: Negative for  dysuria. Musculoskeletal: Negative for back pain. Patient has mild brusing across right foot. Patient states that it causes pain to move her right foot at all. Skin: Negative for rash. Neurological: Negative for headaches, focal weakness or numbness.  10-point ROS otherwise negative.  ____________________________________________   PHYSICAL EXAM:  VITAL SIGNS: ED Triage Vitals  Enc Vitals Group     BP 12/15/15 1453 172/72 mmHg     Pulse Rate 12/15/15 1453 69     Resp 12/15/15 1453 16     Temp 12/15/15 1453 98.1 F (36.7 C)     Temp Source 12/15/15 1453 Oral     SpO2 12/15/15 1453 96 %     Weight 12/15/15 1453 147 lb (66.679 kg)     Height 12/15/15 1453  (1.626 m)     Head Cir --      Peak Flow --      Pain Score 12/15/15 1453 6     Pain Loc --      Pain Edu? --      Excl. in GC? --     Constitutional: Alert and oriented. Well appearing and in no acute distress. Head: Atraumatic. Nose: No congestion/rhinnorhea. Mouth/Throat: Mucous membranes are moist.  Oropharynx non-erythematous. Neck: No stridor.   Cardiovascular: Normal rate, regular rhythm. Grossly normal heart sounds.  Good peripheral circulation. Respiratory: Normal respiratory effort.  No retractions. Lungs CTAB. Musculoskeletal: patient has mild bruising to the right foot. She  is able to moderately move toes and flex and extend ankle. Neurologic:  Normal speech and language. No gross focal neurologic deficits are appreciated. No gait instability. Skin:  Skin is warm, dry and intact. No rash noted. Psychiatric: Mood and affect are normal. Speech and behavior are normal.  ____________________________________________   LABS (all labs ordered are listed, but only abnormal results are displayed)  Labs Reviewed - No data to display ____________________________________________  EKG  Not indicated ____________________________________________  RADIOLOGY  No osseous  findings. ____________________________________________   PROCEDURES  Procedure(s) performed: None  Procedures  Critical Care performed: No  ____________________________________________   INITIAL IMPRESSION / ASSESSMENT AND PLAN / ED COURSE  Pertinent labs & imaging results that were available during my care of the patient were reviewed by me and considered in my medical decision making (see chart for details).  No osseous abnormalities on x-ray. Patient given pain medication and reassurance. Patient would benefit from R.I.C.E. Patient to follow up with orthopedics if pain persists past a week or the pain does not improve. ____________________________________________   FINAL CLINICAL IMPRESSION(S) / ED DIAGNOSES  Final diagnoses:  Foot contusion, right, initial encounter      NEW MEDICATIONS STARTED DURING THIS VISIT:  New Prescriptions   MELOXICAM (MOBIC) 15 MG TABLET    Take 1 tablet (15 mg total) by mouth daily.     Note:  This document was prepared using Dragon voice recognition software and may include unintentional dictation errors.   Evangeline DakinCharles M Shonya Sumida, PA-C 12/15/15 1616  Sharman CheekPhillip Stafford, MD 12/16/15 2011

## 2015-12-15 NOTE — Discharge Instructions (Signed)

## 2015-12-15 NOTE — ED Notes (Signed)
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  Pt with throbbing pain to right foot, numbness/tingling to toes, limited mobility to toes.  Pt A/Ox4, vitals WDL, no immediate distress at this time.

## 2015-12-31 ENCOUNTER — Ambulatory Visit
Admission: EM | Admit: 2015-12-31 | Discharge: 2015-12-31 | Disposition: A | Discharge status: Home / Self Care | Payer: BLUE CROSS/BLUE SHIELD | Attending: Family Medicine | Admitting: Family Medicine

## 2015-12-31 ENCOUNTER — Encounter: Payer: Self-pay | Admitting: Emergency Medicine

## 2015-12-31 DIAGNOSIS — H04301 Unspecified dacryocystitis of right lacrimal passage: Secondary | ICD-10-CM

## 2015-12-31 DIAGNOSIS — H109 Unspecified conjunctivitis: Secondary | ICD-10-CM

## 2015-12-31 MED ORDER — POLYMYXIN B-TRIMETHOPRIM 10000-0.1 UNIT/ML-% OP SOLN
1.0000 [drp] | Freq: Four times a day (QID) | OPHTHALMIC | 0 refills | Status: DC
Start: 2015-12-31 — End: 2016-01-15

## 2015-12-31 NOTE — ED Provider Notes (Signed)
[facility]    CSN: [id number] Arrival date & time: 12/31/15  1253  First Provider Contact:  First MD Initiated Contact with Patient 12/31/15 1317        History   Chief Complaint Chief Complaint  Patient presents with  . Eye Problem    HPI Debra Richmond is a 25 y.o. female.   HPI: Patient presents today with symptoms of right lower lid swelling. Patient has had discharge from the eye as well. The swelling is around the lacrimal duct. She denies any vision problems or eye pain. She does wear contact lenses but has not worn them in several weeks. She does use eye makeup.  Past Medical History:  Diagnosis Date  . GERD (gastroesophageal reflux disease)   . PCOD (polycystic ovarian disease)   . Rotator cuff disorder    right shoulder    There are no active problems to display for this patient.   Past Surgical History:  Procedure Laterality Date  . CHOLECYSTECTOMY      OB History    No data available       Home Medications    Prior to Admission medications   Not on File    Family History History reviewed. No pertinent family history.  Social History Social History  Substance Use Topics  . Smoking status: Current Every Day Smoker    Packs/day: 0.50    Types: Cigarettes  . Smokeless tobacco: Never Used  . Alcohol use Yes     Comment: rarely     Allergies   Dilaudid [hydromorphone hcl]; Garlic; Prednisone; and Sulfa antibiotics   Review of Systems Review of Systems: Negative except mentioned above.    Physical Exam Triage Vital Signs ED Triage Vitals  Enc Vitals Group     BP 12/31/15 1314 117/78     Pulse Rate 12/31/15 1314 62     Resp 12/31/15 1314 16     Temp 12/31/15 1314 97.5 F (36.4 C)     Temp Source 12/31/15 1314 Tympanic     SpO2 12/31/15 1314 100 %     Weight 12/31/15 1314 143 lb (64.9 kg)     Height 12/31/15 1314  (1.626 m)     Head Circumference --      Peak Flow --      Pain Score 12/31/15 1316 6     Pain  Loc --      Pain Edu? --      Excl. in GC? --    No data found.   Updated Vital Signs BP 117/78 (BP Location: Left Arm)   Pulse 62   Temp 97.5 F (36.4 C) (Tympanic)   Resp 16   Ht  (1.626 m)   Wt 143 lb (64.9 kg)   LMP 12/10/2015 (Approximate)   SpO2 100%   BMI 24.55 kg/m   Visual Acuity Right Eye Distance: 20/200 uncorrected, blurry Left Eye Distance: 20/200 uncorrected Bilateral Distance:    Physical Exam: ROS: Negative except mentioned above.  GENERAL: NAD HEENT: no pharyngeal erythema, no exudate, no erythema of TMs, mild right conjunctiva erythema, mild swelling noted where lacrimal duct is, no erythema of surrounding skin, *Patient is not wearing her contact lenses and states that without them she cannot see well explaining her vision results today. RESP: CTA B CARD: RRR NEURO: CN II-XII grossly intact     UC Treatments / Results  Labs (all labs ordered are listed, but only abnormal results are displayed) Labs Reviewed -  No data to display  EKG  EKG Interpretation None       Radiology No results found.  Procedures Procedures (including critical care time)  Medications Ordered in UC Medications - No data to display   Initial Impression / Assessment and Plan / UC Course  I have reviewed the triage vital signs and the nursing notes.  Pertinent labs & imaging results that were available during my care of the patient were reviewed by me and considered in my medical decision making (see chart for details).  Clinical Course   A/P: R Conjunctivitis - discussed possible lacrimal duct obstruction, warm compresses and massage instructed, will try Polytrim at this time, if symptoms do persist or worsen I do recommend that patient follow up with ophthalmology. Dispose of eye makeup recently used. Do not wear new contact lenses until symptoms have resolved.  Final Clinical Impressions(s) / UC Diagnoses   Final diagnoses:  None    New  Prescriptions New Prescriptions   No medications on file     Jolene Provost, MD 12/31/15 1337

## 2015-12-31 NOTE — ED Triage Notes (Signed)
Patient c/o sty on her bottom right eye lid that has not improved.

## 2016-01-13 ENCOUNTER — Ambulatory Visit
Admission: EM | Admit: 2016-01-13 | Discharge: 2016-01-13 | Disposition: A | Discharge status: Home / Self Care | Payer: BLUE CROSS/BLUE SHIELD | Attending: Family Medicine | Admitting: Family Medicine

## 2016-01-13 DIAGNOSIS — K611 Rectal abscess: Secondary | ICD-10-CM | POA: Diagnosis not present

## 2016-01-13 MED ORDER — CLINDAMYCIN HCL 300 MG PO CAPS: 300.0000 mg | ORAL_CAPSULE | Freq: Four times a day (QID) | ORAL | 0 refills | Status: DC

## 2016-01-13 MED ORDER — OXYCODONE-ACETAMINOPHEN 5-325 MG PO TABS: 1.0000 | ORAL_TABLET | Freq: Three times a day (TID) | ORAL | 0 refills | Status: DC | PRN

## 2016-01-13 NOTE — Discharge Instructions (Signed)
Take medication as prescribed. Rest. Take over the counter stool softner as needed. Drink plenty of fluids. Warm sitz baths and warm compresses multiple times per day. Keep area clean.   Follow up with surgeon tomorrow.   Follow up with your primary care physician this week as needed. Return to Urgent care or ER for new or worsening concerns.

## 2016-01-13 NOTE — ED Triage Notes (Signed)
Patient states that she has had a abscess that came up about 2 months ago and she went to her PCP and they prescribed a cream and antibiotic. It came back and she popped it herself about a month ago, but it has grown in size and is very painful.

## 2016-01-13 NOTE — ED Provider Notes (Signed)
[facility] ____________________________________________  Time seen: Approximately 6:46 PM  I have reviewed the triage vital signs and the nursing notes.   HISTORY  Chief Complaint Recurrent Skin Infections (Near Anus)  HPI Debra Richmond is a 25 y.o. female reasons for the evaluation of tender, swollen and painful area near her rectum. Patient reports overall this has been an issue for the last 2 months. Patient reports that she was seen by her primary care physician approximately 2 months ago for the same. Patient present that time she was treated with oral and topical antibiotics. Patient does report that the area improved but she does not feel like it ever fully resolved. Patient reports that last month the area became large again in which she and her partner popped the area and allow drainage to occur. Patient states that that didn't help the swelling to improve but again never fully resolved.  Patient reports over the last week gradual onset of increased size, swelling and pain again. Denies any drainage at this time. Patient reports no rectal drainage. Denies fevers. Reports continues to eat and drink well. Reports continues with normal flatulence and bowel movements.   In discussing with patient, patient reports she has had multiple skin infections and abscesses in the past. Patient reports she has never been tested for MRSA however she states it has been discussed with her in the past as a suspicion.   Denies fevers, nausea, vomiting, constipation, chest pain, shortness of breath, dysuria. Reports recently have loose stools since having her gallbladder removed but denies changes in her normal stool.  Patient's last menstrual period was 12/30/2015. Denies chance of pregnancy.   PCP: [facility].    Past Medical History:  Diagnosis Date  . GERD (gastroesophageal reflux disease)   . PCOD (polycystic ovarian disease)   . Rotator cuff disorder    right shoulder     There are no active problems to display for this patient.   Past Surgical History:  Procedure Laterality Date  . CHOLECYSTECTOMY      No current facility-administered medications for this encounter.   Current Outpatient Prescriptions:  .  clindamycin (CLEOCIN) 300 MG capsule, Take 1 capsule (300 mg total) by mouth 4 (four) times daily., Disp: 40 capsule, Rfl: 0 .  oxyCODONE-acetaminophen (ROXICET) 5-325 MG tablet, Take 1 tablet by mouth every 8 (eight) hours as needed for moderate pain or severe pain (Do not drive or operate heavy machinery while taking as can cause drowsiness.)., Disp: 6 tablet, Rfl: 0 .  trimethoprim-polymyxin b (POLYTRIM) ophthalmic solution, Place 1 drop into the right eye every 6 (six) hours., Disp: 10 mL, Rfl: 0  Allergies  Dilaudid [hydromorphone hcl]; Garlic; Prednisone; and Sulfa antibiotics  History reviewed. No pertinent family history.  Social History Social History  Substance Use Topics  . Smoking status: Current Every Day Smoker    Packs/day: 0.50    Types: Cigarettes  . Smokeless tobacco: Never Used  . Alcohol use Yes     Comment: rarely    Review of Systems Constitutional: No fever/chills Eyes: No visual changes. ENT: No sore throat. Cardiovascular: Denies chest pain. Respiratory: Denies shortness of breath. Gastrointestinal: No abdominal pain.  No nausea, no vomiting.  No diarrhea.  No constipation. Genitourinary: Negative for dysuria. Musculoskeletal: Negative for back pain. Skin: Negative for rash. As above.  Neurological: Negative for headaches, focal weakness or numbness.  10-point ROS otherwise negative.  ____________________________________________   PHYSICAL EXAM:  VITAL SIGNS: ED Triage Vitals  Enc Vitals  Group     BP 01/13/16 1822 111/67     Pulse Rate 01/13/16 1822 88     Resp 01/13/16 1822 18     Temp 01/13/16 1822 98 F (36.7 C)     Temp Source 01/13/16 1822 Oral     SpO2 01/13/16 1822 99 %     Weight  01/13/16 1820 143 lb (64.9 kg)     Height 01/13/16 1820 5\' 4"  (1.626 m)     Head Circumference --      Peak Flow --      Pain Score 01/13/16 1821 8     Pain Loc --      Pain Edu? --      Excl. in GC? --     Constitutional: Alert and oriented. Well appearing and in no acute distress. Eyes: Conjunctivae are normal. PERRL. EOMI. ENT      Head: Normocephalic and atraumatic.      Mouth/Throat: Mucous membranes are moist. Cardiovascular: Normal rate, regular rhythm. Grossly normal heart sounds.  Good peripheral circulation. Respiratory: Normal respiratory effort without tachypnea nor retractions. Breath sounds are clear and equal bilaterally. No wheezes/rales/rhonchi.. Gastrointestinal: Soft and nontender. No distention.  Rectal exam: Completed with large are and at bedside. Anterior rectum area of induration 3 x 3.5 cm with localized mild to moderate erythema, no surrounding erythema, minimal fluctuance, no pointing abscess, no drainage. Rectal exam completed, normal sphincter tone, no rectal drainage, no palpable mass in rectum. Musculoskeletal:  Nontender with normal range of motion in all extremities.  Neurologic:  Normal speech and language. No gross focal neurologic deficits are appreciated. Speech is normal. No gait instability.  Skin:  Skin is warm, dry and intact. No rash noted. Psychiatric: Mood and affect are normal. Speech and behavior are normal. Patient exhibits appropriate insight and judgment   ___________________________________________   LABS (all labs ordered are listed, but only abnormal results are displayed)  Labs Reviewed - No data to display  PROCEDURES Procedures  ______________________________________   INITIAL IMPRESSION / ASSESSMENT AND PLAN / ED COURSE  Pertinent labs & imaging results that were available during my care of the patient were reviewed by me and considered in my medical decision making (see chart for details).  Well-appearing patient. No  acute distress. Presenting for the complaints of perirectal abscess. Overall sounds like this has been occurring over the last 2 months. 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 Recommend patient to follow-up with surgery in 1-2 days. As concern for moderate MRSA infection and patient is sulfa allergic, will treat patient with oral clindamycin. Encourage taking with food, and taking oral probiotics. Encouraged rest, fluids, sitz baths, keeping area clean, warm compresses and close follow-up. Percocet quantity #6 given as needed for pain, patient reports has tolerated Percocet well in the past. Also discussed taking an over-the-counter stool softener as needed. Discussed indication, risks and benefits of medications with patient.  Discussed follow up with Primary care physician this week. Discussed follow up and return parameters including no resolution or any worsening concerns. Patient verbalized understanding and agreed to plan.   ____________________________________________   FINAL CLINICAL IMPRESSION(S) / ED DIAGNOSES  Final diagnoses:  Perirectal abscess     Discharge Medication List as of 01/13/2016  6:51 PM    START taking these medications   Details  clindamycin (CLEOCIN) 300 MG capsule Take 1 capsule (300 mg total) by mouth 4 (four) times daily., Starting Tue 01/13/2016, Normal    oxyCODONE-acetaminophen (ROXICET) 5-325 MG tablet Take 1  tablet by mouth every 8 (eight) hours as needed for moderate pain or severe pain (Do not drive or operate heavy machinery while taking as can cause drowsiness.)., Starting Tue 01/13/2016, Print        Note: This dictation was prepared with Dragon dictation along with smaller phrase technology. Any transcriptional errors that result from this process are unintentional.    Clinical Course      Renford DillsLindsey Copeland Lapier, NP 01/13/16 1950

## 2016-01-14 DIAGNOSIS — E282 Polycystic ovarian syndrome: Secondary | ICD-10-CM | POA: Insufficient documentation

## 2016-01-15 ENCOUNTER — Ambulatory Visit (INDEPENDENT_AMBULATORY_CARE_PROVIDER_SITE_OTHER): Payer: BLUE CROSS/BLUE SHIELD | Admitting: Surgery

## 2016-01-15 ENCOUNTER — Encounter: Payer: Self-pay | Admitting: Surgery

## 2016-01-15 VITALS — BP 112/74 | HR 76 | Temp 97.8°F | Wt 139.0 lb

## 2016-01-15 DIAGNOSIS — K611 Rectal abscess: Secondary | ICD-10-CM

## 2016-01-15 MED ORDER — CEPHALEXIN 500 MG PO CAPS: 500.0000 mg | ORAL_CAPSULE | Freq: Four times a day (QID) | ORAL | 0 refills | Status: DC

## 2016-01-15 NOTE — Patient Instructions (Addendum)
Please apply a dry dressing on your affected area.   Please start taking your antibiotic so when you come back next week it could be healed.

## 2016-01-15 NOTE — Progress Notes (Signed)
Debra Richmond is an 25 y.o. female.   Chief Complaint: Recurrent perirectal abscess HPI: This a patient with a recurrent perirectal abscess she's had this happened multiple times always having them spontaneously drained she's never had an I&D before this one started several weeks ago but in the last 3 or 4 days and worsened she it drained several days ago but is refilled and is quite painful. She denies fevers or chills.  Past Medical History:  Diagnosis Date  . GERD (gastroesophageal reflux disease)   . PCOD (polycystic ovarian disease)   . Rotator cuff disorder    right shoulder    Past Surgical History:  Procedure Laterality Date  . CHOLECYSTECTOMY  10/19/2012    Family History  Problem Relation Age of Onset  . Lupus Mother    Social History:  reports that she has been smoking Cigarettes.  She has been smoking about 0.50 packs per day. She has never used smokeless tobacco. She reports that she drinks alcohol. She reports that she does not use drugs.  Allergies:  Allergies  Allergen Reactions  . Dilaudid [Hydromorphone Hcl] Hives  . Garlic   . Hydromorphone   . Prednisone Other (See Comments)    Disorientation  . Sulfa Antibiotics Nausea And Vomiting  . Clindamycin Diarrhea    Chest tighteness     (Not in a hospital admission)   Review of Systems:   Review of Systems  Constitutional: Negative.   HENT: Negative.   Eyes: Negative.   Respiratory: Negative.   Cardiovascular: Negative.   Gastrointestinal: Negative.   Genitourinary: Negative.   Musculoskeletal: Negative.   Skin: Negative.   Neurological: Negative.   Endo/Heme/Allergies: Negative.   Psychiatric/Behavioral: Negative.     Physical Exam:  Physical Exam  Constitutional: She is well-developed, well-nourished, and in no distress. No distress.  HENT:  Head: Normocephalic and atraumatic.  Eyes: Right eye exhibits no discharge. Left eye exhibits no discharge. No scleral icterus.  Neck: Normal range  of motion.  Abdominal: Soft. She exhibits no distension. There is no tenderness.  Genitourinary:  Genitourinary Comments: Rectal exam demonstrates a left sided area of induration and fluctuance and minimal spontaneous drainage with tenderness  Musculoskeletal: Normal range of motion. She exhibits no edema.  Lymphadenopathy:    She has no cervical adenopathy.  Skin: She is not diaphoretic.  Vitals reviewed.   Blood pressure 112/74, pulse 76, temperature 97.8 F (36.6 C), temperature source Oral, weight 139 lb (63 kg), last menstrual period 12/30/2015.    No results found for this or any previous visit (from the past 48 hour(s)). No results found.   Assessment/Plan Commend incision and drainage of a recurrent perirectal abscess it is quite small and my concerns at that this is a fistula en ano. It is likely to recur in the exact same spot as it has previously she will likely require referral to colorectal surgery at some point. After obtaining informed consent concerning the risks of bleeding infection recurrence following procedure was performed.  Aseptic conditions were utilized local anesthetic was placed and drainage was obtained with a blade. No packing was placed.  Patient tolerated this procedure well but I suspect that this is a fistula and will recur therefore I will see her back either tomorrow or next week and refer her for definitive therapy.  Lattie Hawichard E Osman Calzadilla, MD, FACS

## 2016-01-19 ENCOUNTER — Emergency Department
Admission: EM | Admit: 2016-01-19 | Discharge: 2016-01-19 | Disposition: A | Discharge status: Home / Self Care | Payer: BLUE CROSS/BLUE SHIELD | Attending: Student | Admitting: Student

## 2016-01-19 ENCOUNTER — Encounter: Payer: Self-pay | Admitting: Emergency Medicine

## 2016-01-19 DIAGNOSIS — L03114 Cellulitis of left upper limb: Secondary | ICD-10-CM | POA: Diagnosis not present

## 2016-01-19 DIAGNOSIS — L989 Disorder of the skin and subcutaneous tissue, unspecified: Secondary | ICD-10-CM | POA: Diagnosis present

## 2016-01-19 DIAGNOSIS — K611 Rectal abscess: Secondary | ICD-10-CM | POA: Diagnosis not present

## 2016-01-19 DIAGNOSIS — F17210 Nicotine dependence, cigarettes, uncomplicated: Secondary | ICD-10-CM | POA: Diagnosis not present

## 2016-01-19 LAB — CBC WITH DIFFERENTIAL/PLATELET
BASOS ABS: 0.1 10*3/uL (ref 0–0.1)
BASOS PCT: 0 %
EOS ABS: 0.2 10*3/uL (ref 0–0.7)
Eosinophils Relative: 2 %
HCT: 41.3 % (ref 35.0–47.0)
Hemoglobin: 14.2 g/dL (ref 12.0–16.0)
Lymphocytes Relative: 23 %
Lymphs Abs: 3 10*3/uL (ref 1.0–3.6)
MCH: 31.4 pg (ref 26.0–34.0)
MCHC: 34.4 g/dL (ref 32.0–36.0)
MCV: 91.3 fL (ref 80.0–100.0)
Monocytes Absolute: 1.1 10*3/uL — ABNORMAL HIGH (ref 0.2–0.9)
Monocytes Relative: 8 %
NEUTROS ABS: 8.9 10*3/uL — AB (ref 1.4–6.5)
NEUTROS PCT: 67 %
PLATELETS: 263 10*3/uL (ref 150–440)
RBC: 4.52 MIL/uL (ref 3.80–5.20)
RDW: 12.3 % (ref 11.5–14.5)
WBC: 13.3 10*3/uL — AB (ref 3.6–11.0)

## 2016-01-19 LAB — COMPREHENSIVE METABOLIC PANEL
ALK PHOS: 87 U/L (ref 38–126)
ALT: 26 U/L (ref 14–54)
ANION GAP: 8 (ref 5–15)
AST: 23 U/L (ref 15–41)
Albumin: 4.4 g/dL (ref 3.5–5.0)
BUN: 12 mg/dL (ref 6–20)
CALCIUM: 9.4 mg/dL (ref 8.9–10.3)
CO2: 25 mmol/L (ref 22–32)
CREATININE: 0.76 mg/dL (ref 0.44–1.00)
Chloride: 107 mmol/L (ref 101–111)
Glucose, Bld: 95 mg/dL (ref 65–99)
Potassium: 3.4 mmol/L — ABNORMAL LOW (ref 3.5–5.1)
Sodium: 140 mmol/L (ref 135–145)
Total Bilirubin: 0.4 mg/dL (ref 0.3–1.2)
Total Protein: 7.2 g/dL (ref 6.5–8.1)

## 2016-01-19 LAB — CK: CK TOTAL: 76 U/L (ref 38–234)

## 2016-01-19 MED ORDER — ONDANSETRON HCL 4 MG/2ML IJ SOLN
4.0000 mg | Freq: Once | INTRAMUSCULAR | Status: AC
  Administered 2016-01-19: 4 mg via INTRAVENOUS
  Filled 2016-01-19: qty 2

## 2016-01-19 MED ORDER — SODIUM CHLORIDE 0.9 % IV SOLN
4.0000 mg/kg | INTRAVENOUS | Status: DC
  Administered 2016-01-19: 252.5 mg via INTRAVENOUS
  Filled 2016-01-19 (×2): qty 5.05

## 2016-01-19 MED ORDER — MORPHINE SULFATE (PF) 2 MG/ML IV SOLN
2.0000 mg | Freq: Once | INTRAVENOUS | Status: AC
  Administered 2016-01-19: 2 mg via INTRAVENOUS
  Filled 2016-01-19: qty 1

## 2016-01-19 MED ORDER — SODIUM CHLORIDE 0.9 % IV BOLUS (SEPSIS)
1000.0000 mL | Freq: Once | INTRAVENOUS | Status: AC
  Administered 2016-01-19: 1000 mL via INTRAVENOUS

## 2016-01-19 MED ORDER — DOXYCYCLINE HYCLATE 100 MG PO TABS: 100.0000 mg | ORAL_TABLET | Freq: Two times a day (BID) | ORAL | 0 refills | Status: DC

## 2016-01-19 MED ORDER — DIPHENHYDRAMINE HCL 50 MG/ML IJ SOLN
50.0000 mg | Freq: Once | INTRAMUSCULAR | Status: AC
  Administered 2016-01-19: 50 mg via INTRAVENOUS

## 2016-01-19 MED ORDER — VANCOMYCIN HCL IN DEXTROSE 750-5 MG/150ML-% IV SOLN
750.0000 mg | Freq: Once | INTRAVENOUS | Status: AC
  Administered 2016-01-19: 750 mg via INTRAVENOUS
  Filled 2016-01-19: qty 150

## 2016-01-19 MED ORDER — DIPHENHYDRAMINE HCL 50 MG/ML IJ SOLN
INTRAMUSCULAR | Status: AC
  Administered 2016-01-19: 50 mg via INTRAVENOUS
  Filled 2016-01-19: qty 1

## 2016-01-19 NOTE — ED Triage Notes (Signed)
Pt noticed "small green bubble" on left arm near elbow Friday . Pt states area progressively becoming more red and swollen. Currently area is tender to touch, red, and swollen.

## 2016-01-19 NOTE — ED Notes (Signed)
Pt complained of itching, vancomycin paused, PA made aware. [name], PA gave verbal order to stop vancomycin. Vancomycin stopped. Pt still remains with IV fluids. Pt denies any breathing difficulty.

## 2016-01-19 NOTE — Progress Notes (Signed)
Pharmacy Antibiotic Note  Debra Richmond is a 25 y.o. female admitted on 01/19/2016 with cellulitis.  Pharmacy has been consulted for Daptomycin dosing.  Plan: Baseline CPK ordered.  Will start the patient on Daptomycin 252mg  (4mg /kg) IV every 24 hours. Recommend monitoring CP weekly.   Height: 5\' 4"  (162.6 cm) Weight: 139 lb (63 kg) IBW/kg (Calculated) : 54.7  Temp (24hrs), Avg:98.3 F (36.8 C), Min:98.3 F (36.8 C), Max:98.3 F (36.8 C)   Recent Labs Lab 01/19/16 1821  WBC 13.3*  CREATININE 0.76    Estimated Creatinine Clearance: 93.6 mL/min (by C-G formula based on SCr of 0.8 mg/dL).    Allergies  Allergen Reactions  . Dilaudid [Hydromorphone Hcl] Hives  . Garlic   . Hydromorphone   . Prednisone Other (See Comments)    Disorientation  . Sulfa Antibiotics Nausea And Vomiting  . Clindamycin Diarrhea    Chest tighteness    Antimicrobials this admission: 8/21 Daptomycin >>   Thank you for allowing pharmacy to be a part of this patient's care.  Cher NakaiSheema Beckem Tomberlin, PharmD Clinical Pharmacist 01/19/2016 8:12 PM

## 2016-01-19 NOTE — ED Provider Notes (Signed)
[facility] Emergency Department Provider Note  ____________________________________________  Time seen: Approximately 6:05 PM  I have reviewed the triage vital signs and the nursing notes.   HISTORY  Chief Complaint Abscess (left arm)    HPI Debra Richmond is a 25 y.o. female who presents emergency department complaining of infected skin lesion to the left arm. Patient reports that she has been seen in the past week by urgent care as well as a surgeon for a perirectal abscess. Patient reports that she has had multiple skin infections over the past year to year and a half. Patient was started on clindamycin by urgent care and had a reaction to same. She has been switched to Keflex. Patient reports that while on antibiotics she developed a skin lesion to the leftforearm. Patient denies any drainage from site. She denies any numbness or tingling distally. Patient reports that there has been discussion about her having MRSA but she has not been officially diagnosed with same. Patient is allergic to multiple antibiotics including sulfa medications and clindamycin.  Patient has follow-up with general surgeon in 2 days. Patient is also scheduled to see a colorectal surgeon for possible bowel fistula for her perirectal abscess   Past Medical History:  Diagnosis Date  . GERD (gastroesophageal reflux disease)   . PCOD (polycystic ovarian disease)   . Rotator cuff disorder    right shoulder    Patient Active Problem List   Diagnosis Date Noted  . PCOS (polycystic ovarian syndrome) 01/14/2016  . Right shoulder strain 08/27/2015  . Right shoulder tendinitis 08/27/2015  . Gastro-esophageal reflux disease with esophagitis 08/14/2012    Past Surgical History:  Procedure Laterality Date  . CHOLECYSTECTOMY  10/19/2012    Prior to Admission medications   Medication Sig Start Date End Date Taking? Authorizing Provider  cephALEXin (KEFLEX) 500 MG capsule Take 1 capsule  (500 mg total) by mouth 4 (four) times daily. 01/15/16   Lattie Haw, MD  doxycycline (VIBRA-TABS) 100 MG tablet Take 1 tablet (100 mg total) by mouth 2 (two) times daily. 01/19/16   Delorise Royals Tiberius Loftus, PA-C  oxyCODONE-acetaminophen (ROXICET) 5-325 MG tablet Take 1 tablet by mouth every 8 (eight) hours as needed for moderate pain or severe pain (Do not drive or operate heavy machinery while taking as can cause drowsiness.). 01/13/16   Renford Dills, NP    Allergies Dilaudid [hydromorphone hcl]; Garlic; Hydromorphone; Prednisone; Sulfa antibiotics; and Clindamycin  Family History  Problem Relation Age of Onset  . Lupus Mother     Social History Social History  Substance Use Topics  . Smoking status: Current Every Day Smoker    Packs/day: 0.50    Types: Cigarettes  . Smokeless tobacco: Never Used  . Alcohol use Yes     Comment: rarely     Review of Systems  Constitutional: No fever/chills Cardiovascular: no chest pain. Respiratory: no cough. No SOB. Gastrointestinal: No abdominal pain.  No nausea, no vomiting.  No diarrhea.  No constipation.Positive for perirectal abscess. Musculoskeletal: Negative for musculoskeletal pain. Skin: Positive for skin lesion to the left forearm. Neurological: Negative for headaches, focal weakness or numbness. 10-point ROS otherwise negative.  ____________________________________________   PHYSICAL EXAM:  VITAL SIGNS: ED Triage Vitals  Enc Vitals Group     BP 01/19/16 1728 131/80     Pulse Rate 01/19/16 1728 74     Resp 01/19/16 1728 18     Temp 01/19/16 1728 98.3 F (36.8 C)     Temp Source  01/19/16 1728 Oral     SpO2 01/19/16 1728 100 %     Weight 01/19/16 1728 139 lb (63 kg)     Height 01/19/16 1728 5\' 4"  (1.626 m)     Head Circumference --      Peak Flow --      Pain Score 01/19/16 1733 7     Pain Loc --      Pain Edu? --      Excl. in GC? --      Constitutional: Alert and oriented. Well appearing and in no acute  distress. Eyes: Conjunctivae are normal. PERRL. EOMI. Head: Atraumatic. Neck: No stridor.    Cardiovascular: Normal rate, regular rhythm. Normal S1 and S2.  Good peripheral circulation. Respiratory: Normal respiratory effort without tachypnea or retractions. Lungs CTAB. Good air entry to the bases with no decreased or absent breath sounds. Gastrointestinal: Bowel sounds 4 quadrants. Soft and nontender to palpation. No guarding or rigidity. No palpable masses. No distention. No CVA tenderness. Musculoskeletal: Full range of motion to all extremities. No gross deformities appreciated. Neurologic:  Normal speech and language. No gross focal neurologic deficits are appreciated.  Skin:  Skin is warm, dry and intact. No rash noted.Erythematous and edematous lesion noted to the left forearm. This is on the lateral aspect of the arm. Area is erythematous, edematous, firm to palpation. Area is approximately 5 cm in diameter. No fluctuance. No drainage. Sensation and pulses intact distally. Psychiatric: Mood and affect are normal. Speech and behavior are normal. Patient exhibits appropriate insight and judgement.   ____________________________________________   LABS (all labs ordered are listed, but only abnormal results are displayed)  Labs Reviewed  COMPREHENSIVE METABOLIC PANEL - Abnormal; Notable for the following:       Result Value   Potassium 3.4 (*)    All other components within normal limits  CBC WITH DIFFERENTIAL/PLATELET - Abnormal; Notable for the following:    WBC 13.3 (*)    Neutro Abs 8.9 (*)    Monocytes Absolute 1.1 (*)    All other components within normal limits  CK   ____________________________________________  EKG   ____________________________________________  RADIOLOGY   No results found.  ____________________________________________    PROCEDURES  Procedure(s) performed:    Procedures    Medications  DAPTOmycin (CUBICIN) 252.5 mg in sodium  chloride 0.9 % IVPB (252.5 mg Intravenous Given 01/19/16 2031)  sodium chloride 0.9 % bolus 1,000 mL (0 mLs Intravenous Stopped 01/19/16 2029)  vancomycin (VANCOCIN) IVPB 750 mg/150 ml premix (750 mg Intravenous Given 01/19/16 1845)  ondansetron (ZOFRAN) injection 4 mg (4 mg Intravenous Given 01/19/16 1821)  morphine 2 MG/ML injection 2 mg (2 mg Intravenous Given 01/19/16 1821)  diphenhydrAMINE (BENADRYL) injection 50 mg (50 mg Intravenous Given 01/19/16 1927)  morphine 2 MG/ML injection 2 mg (2 mg Intravenous Given 01/19/16 2114)     ____________________________________________   INITIAL IMPRESSION / ASSESSMENT AND PLAN / ED COURSE  Pertinent labs & imaging results that were available during my care of the patient were reviewed by me and considered in my medical decision making (see chart for details).  In addition to patient evaluation and physical exam, 45 minutes is spent discussing patient case with specialists, reviewing results, and formulating appropriate treatment plan for patient.  Review of the [place] CSRS was performed in accordance of the [employer] prior to dispensing any controlled drugs.  Clinical Course  Comment By Time  Patient presents emergency department for a increasing cellulitis to the left forearm. Patient  has been recently diagnosed with a perirectal abscess and has had surgical follow-up for same. Patient was on clindamycin originally had a reaction to same. She has been switched to Keflex. At this time with new cellulitis while on oral antibiotic patient will be given IV antibiotics emergency department. Basic labs will be drawn. No indication for incision and drainage of this lesion at this time. Patient will be sent home with antibiotic for coverage for MRSA as well as strep coverage. Patient will be continued on her Keflex with an addition of MRSA coverage antibiotic. Patient is allergic to clindamycin and Bactrim. Delorise RoyalsJonathan D Edwin Baines, PA-C 08/21 1812  While receiving  vancomycin patient complains of generalized pruritus that was worse in the scalp, face, neck region. Patient denies any rash. No rashes visualized on exam. Patient denied shortness of breath. No stridor. No angioedema. Lung sounds clear. IV antibiotics are stopped and patient is given IV Benadryl. This resolves symptoms of pleuritis. At this time, I have consulted with Dr. Sampson GoonFitzgerald the on call infectious disease specialist. After discussing patient's reoccurring skin complaints, recent antibiotic use, reaction to medication and emergency department, Dr. Sampson GoonFitzgerald recommends using 4 mg/kg of daptomycin IV here in the emergency department. Patient will be discharged with doxycycline. I have informed patient of clinical progress at this time and she is agreeable to continue with IV antibiotics. Patient will follow-up with Dr. Sampson GoonFitzgerald in addition to seeing general surgery in 2 days with referral to colorectal surgery after that. Delorise RoyalsJonathan D Zorina Mallin, PA-C 08/21 1956    Patient's diagnosis is consistent with Cellulitis to left arm and perirectal abscess. Perirectal abscesses being followed by general surgery with referral to colorectal surgery. Patient reports to the emergency department with a cellulitis to the left arm even while taking antibiotics. Patient was initially placed on vancomycin. Patient reports generalized pruritus with no rash with this medication. Antibiotic to stopped, and 50 mg of diphenhydramine is administered with good symptomatic control. Infectious disease was consulted for other antibiotic use. Patient is allergic to sulfa medications, clindamycin, vancomycin. After discussing the patient's case with infectious disease specialist, Dr. Sampson GoonFitzgerald, it is decided patient will receive daptomycin 4 mg/kg IV in the emergency department. Patient with no reported reactions to this medication while in the emergency department. After this treatment, patient will be discharged on doxycycline.  Patient is to continue oral Keflex at home. Patient will follow-up with general surgery in 2 days for repeat evaluation of perirectal abscess. Patient will follow-up with infectious disease for continued skin infections with limited antibiotic coverage available due to previous allergic reactions..   Patient will be discharged home with prescriptions for doxycycline to be taken in addition to prescribed oral Keflex. Patient is to follow up with general surgery, colorectal surgery, infectious disease for further evaluation and treatment.. Patient is given ED precautions to return to the ED for any worsening or new symptoms.     ____________________________________________  FINAL CLINICAL IMPRESSION(S) / ED DIAGNOSES  Final diagnoses:  Cellulitis of left arm  Perirectal abscess      NEW MEDICATIONS STARTED DURING THIS VISIT:  Discharge Medication List as of 01/19/2016  9:33 PM    START taking these medications   Details  doxycycline (VIBRA-TABS) 100 MG tablet Take 1 tablet (100 mg total) by mouth 2 (two) times daily., Starting Mon 01/19/2016, Print            This chart was dictated using voice recognition software/Dragon. Despite best efforts to proofread, errors can occur which can  change the meaning. Any change was purely unintentional.    Racheal Patches, PA-C 01/19/16 4540    Gayla Doss, MD 01/19/16 2352

## 2016-01-19 NOTE — ED Notes (Signed)
Antibiotic complete.

## 2016-01-19 NOTE — ED Notes (Signed)
Pt ambulatory with steady gait to restroom without assistance. 

## 2016-01-21 ENCOUNTER — Ambulatory Visit (INDEPENDENT_AMBULATORY_CARE_PROVIDER_SITE_OTHER): Payer: BLUE CROSS/BLUE SHIELD | Admitting: General Surgery

## 2016-01-21 ENCOUNTER — Encounter: Payer: Self-pay | Admitting: General Surgery

## 2016-01-21 VITALS — BP 129/72 | HR 83 | Temp 98.4°F | Ht 64.0 in | Wt 139.0 lb

## 2016-01-21 DIAGNOSIS — L02414 Cutaneous abscess of left upper limb: Secondary | ICD-10-CM | POA: Diagnosis not present

## 2016-01-21 MED ORDER — MUPIROCIN CALCIUM 2 % EX CREA: 1.0000 "application " | TOPICAL_CREAM | Freq: Two times a day (BID) | CUTANEOUS | 0 refills | Status: DC

## 2016-01-21 NOTE — Addendum Note (Signed)
Addended by: Arty BaumgartnerSTRUNK, Raiden Yearwood G on: 01/21/2016 04:29 PM   Modules accepted: Orders

## 2016-01-21 NOTE — Patient Instructions (Addendum)
We have sent the antibiotic cream to your pharmacy today. You will apply this to both areas on your arms twice a day after cleaning area with soap and water and thoroughly drying this.  We will see you back next week in clinic to check these areas. Continue your antibiotics that you are currently taking until you are done with them in addition to applying the cream.  We will place a referral to the doctor for infectious diseases, Dr. Sampson GoonFitzgerald. We will call you with the details of this appointment.

## 2016-01-21 NOTE — Progress Notes (Signed)
Outpatient Surgical Follow Up  01/21/2016  Debra Richmond is an 25 y.o. female.   Chief Complaint  Patient presents with  . Routine Post Op    I&D of Peri-rectal Abscess (8/17)- Dr. Burt Knack    HPI: 26 year old female returns to clinic for evaluation of abscesses. She was seen last week with a perirectal abscess that was drained in the clinic. Since that time she is seen in the emergency department and has developed abscesses to bilateral upper extremities with the left being much worse than the right. Her antibiotics have been changed and she had a reaction to the antibiotic that she was on in the emergency room. Patient reports that her perirectal abscess area feels much better but she still to where was drained. The left upper extremity has become much larger since she was in the emergency department and is very painful and makes it difficult for her to move her wrist. The right upper extremity abscess is 1/10 size of the left and is not changing. Patient reports she's had numerous abscess in the past that have required antibiotic therapy and some have required drainage but spontaneously drained. She currently denies any fevers, chills, nausea, vomiting, chest pain, short of breath, diarrhea, consultation.  Past Medical History:  Diagnosis Date  . GERD (gastroesophageal reflux disease)   . PCOD (polycystic ovarian disease)   . Rotator cuff disorder    right shoulder    Past Surgical History:  Procedure Laterality Date  . CHOLECYSTECTOMY  10/19/2012    Family History  Problem Relation Age of Onset  . Lupus Mother     Social History:  reports that she has been smoking Cigarettes.  She has been smoking about 0.50 packs per day. She has never used smokeless tobacco. She reports that she drinks alcohol. She reports that she does not use drugs.  Allergies:  Allergies  Allergen Reactions  . Dilaudid [Hydromorphone Hcl] Hives  . Garlic   . Hydromorphone   . Prednisone Other (See  Comments)    Disorientation  . Sulfa Antibiotics Nausea And Vomiting  . Clindamycin Diarrhea    Chest tighteness    Medications reviewed.    ROS A multipoint review of systems was completed. All pertinent positives and negatives are documented in the history of present illness and remainder are negative.   BP 129/72   Pulse 83   Temp 98.4 F (36.9 C) (Oral)   Ht 5' 4" (1.626 m)   Wt 63 kg (139 lb)   LMP 12/30/2015   BMI 23.86 kg/m   Physical Exam Gen.: No acute distress Neck: Supple and nontender Chest: Clear to auscultation Heart: Regular rhythm Abdomen: Soft and nontender Skin: Right upper extremity with a 1 cm superficial infection, left upper extremity with a 4 x 3.2 cm area of fluctuance with spreading erythema and central necrosis. Rectum: Rectal exam performed previous I&D site visualized without any evidence of residual abscess or infection.    Results for orders placed or performed during the hospital encounter of 01/19/16 (from the past 48 hour(s))  Comprehensive metabolic panel     Status: Abnormal   Collection Time: 01/19/16  6:21 PM  Result Value Ref Range   Sodium 140 135 - 145 mmol/L   Potassium 3.4 (L) 3.5 - 5.1 mmol/L   Chloride 107 101 - 111 mmol/L   CO2 25 22 - 32 mmol/L   Glucose, Bld 95 65 - 99 mg/dL   BUN 12 6 - 20 mg/dL   Creatinine, Ser  0.76 0.44 - 1.00 mg/dL   Calcium 9.4 8.9 - 10.3 mg/dL   Total Protein 7.2 6.5 - 8.1 g/dL   Albumin 4.4 3.5 - 5.0 g/dL   AST 23 15 - 41 U/L   ALT 26 14 - 54 U/L   Alkaline Phosphatase 87 38 - 126 U/L   Total Bilirubin 0.4 0.3 - 1.2 mg/dL   GFR calc non Af Amer >60 >60 mL/min   GFR calc Af Amer >60 >60 mL/min    Comment: (NOTE) The eGFR has been calculated using the CKD EPI equation. This calculation has not been validated in all clinical situations. eGFR's persistently <60 mL/min signify possible Chronic Kidney Disease.    Anion gap 8 5 - 15  CBC with Differential     Status: Abnormal   Collection  Time: 01/19/16  6:21 PM  Result Value Ref Range   WBC 13.3 (H) 3.6 - 11.0 K/uL   RBC 4.52 3.80 - 5.20 MIL/uL   Hemoglobin 14.2 12.0 - 16.0 g/dL   HCT 41.3 35.0 - 47.0 %   MCV 91.3 80.0 - 100.0 fL   MCH 31.4 26.0 - 34.0 pg   MCHC 34.4 32.0 - 36.0 g/dL   RDW 12.3 11.5 - 14.5 %   Platelets 263 150 - 440 K/uL   Neutrophils Relative % 67 %   Neutro Abs 8.9 (H) 1.4 - 6.5 K/uL   Lymphocytes Relative 23 %   Lymphs Abs 3.0 1.0 - 3.6 K/uL   Monocytes Relative 8 %   Monocytes Absolute 1.1 (H) 0.2 - 0.9 K/uL   Eosinophils Relative 2 %   Eosinophils Absolute 0.2 0 - 0.7 K/uL   Basophils Relative 0 %   Basophils Absolute 0.1 0 - 0.1 K/uL  CK     Status: None   Collection Time: 01/19/16  6:21 PM  Result Value Ref Range   Total CK 76 38 - 234 U/L   No results found.  Assessment/Plan:  1. Cutaneous abscess of left upper extremity 25 year old female with recurrent abscesses. Her previous perirectal abscess drained last week appears to have responded beautifully to therapy and is well healed. She now has bilateral forearm superficial abscesses. The right abscess appears to be responding to antibiotic therapy but the left abscesses worsen despite antibiotic therapy. She has a pending referral to infectious disease and she is having recurrent abscesses in spite of antibiotic therapy.  Procedure note: After obtaining consent the left arm abscess was cleaned with Betadine and sterilely draped. The area of the abscess was then localized with a 1% lidocaine with epinephrine. Once the area was numbed was incised with 11 blade scalpel. It was then probed with a hemostat forceps and copious amount of sanguinous/necrotic fluid was obtained. A culture was then obtained. The area was irrigated out and then packed with quarter-inch iodoform gauze. Skin was cleaned with saline and a plane dressing was placed over this. Patient tolerated procedure well.  Discussed post I&D care of her left forearm. We'll provide  with Bactroban for therapy in addition to her oral antibodies. She will follow-up in clinic in 1 week for additional wound checks. She will also keep her referral to infectious disease.     Clayburn Pert, MD FACS General Surgeon  01/21/2016,3:02 PM

## 2016-01-24 LAB — AEROBIC CULTURE W GRAM STAIN (SUPERFICIAL SPECIMEN)

## 2016-01-24 LAB — AEROBIC CULTURE  (SUPERFICIAL SPECIMEN): GRAM STAIN: NONE SEEN

## 2016-01-27 ENCOUNTER — Encounter: Payer: Self-pay | Admitting: Surgery

## 2016-01-30 ENCOUNTER — Encounter: Payer: Self-pay | Admitting: Surgery

## 2016-01-30 ENCOUNTER — Ambulatory Visit (INDEPENDENT_AMBULATORY_CARE_PROVIDER_SITE_OTHER): Payer: BLUE CROSS/BLUE SHIELD | Admitting: Surgery

## 2016-01-30 ENCOUNTER — Telehealth: Payer: Self-pay

## 2016-01-30 VITALS — BP 143/73 | HR 80 | Temp 98.6°F | Ht 64.0 in | Wt 141.6 lb

## 2016-01-30 DIAGNOSIS — Z09 Encounter for follow-up examination after completed treatment for conditions other than malignant neoplasm: Secondary | ICD-10-CM

## 2016-01-30 NOTE — Telephone Encounter (Signed)
Please refer patient to Dr. Sampson GoonFitzgerald for Infectious Disease Consultation for recurrent abscesses throughout her body.

## 2016-01-30 NOTE — Telephone Encounter (Signed)
I [facility] a [facility]Dr Fitzgerald's office @ [phone] and to [phone]. They will call the patient within 5 business days to make an appointment. I have requested for a fax back with appointment time and date.

## 2016-01-30 NOTE — Progress Notes (Signed)
S/p I.D left forearm last week by Dr. Tonita CongWoodham and Perirectal abscess by Dr. Excell Seltzerooper She has been doing very well and has no complaints No fever or chills   PE NAD Arm wound healing well, no infection, anorectal area no evidence of drainage, fistula or cavities. Completely resolved anorectal process  A/P pending I/D eval for recurrent soft tissue infection No surgical issues at this time RTC prn

## 2016-01-30 NOTE — Patient Instructions (Signed)
Please call with any questions or concerns.

## 2016-02-04 ENCOUNTER — Telehealth: Payer: Self-pay | Admitting: Surgery

## 2016-02-04 NOTE — Telephone Encounter (Signed)
[facility] called and advised they received the referral on patient. They said that patient has an outstanding balance so they will have patient speak with a financial counselor before scheduling an appointment.

## 2016-02-16 ENCOUNTER — Telehealth: Payer: Self-pay | Admitting: Surgery

## 2016-02-16 NOTE — Telephone Encounter (Signed)
Brandy with [facility] called in reference to the referral that was sent to Dr Sampson GoonFitzgerald. She states the patient "is thousands of dollars in debt to [facility]". She states a Firefighterfinancial advisor called and spoke with patient to set up a payment agreement but that patient said she could not afford that. So an appointment will not be made until she can agree to a payment plan

## 2016-02-17 ENCOUNTER — Encounter: Payer: Self-pay | Admitting: Emergency Medicine

## 2016-02-17 ENCOUNTER — Emergency Department
Admission: EM | Admit: 2016-02-17 | Discharge: 2016-02-17 | Disposition: A | Discharge status: Home / Self Care | Payer: BLUE CROSS/BLUE SHIELD | Attending: Emergency Medicine | Admitting: Emergency Medicine

## 2016-02-17 DIAGNOSIS — Y999 Unspecified external cause status: Secondary | ICD-10-CM | POA: Insufficient documentation

## 2016-02-17 DIAGNOSIS — W25XXXA Contact with sharp glass, initial encounter: Secondary | ICD-10-CM | POA: Insufficient documentation

## 2016-02-17 DIAGNOSIS — F17210 Nicotine dependence, cigarettes, uncomplicated: Secondary | ICD-10-CM | POA: Diagnosis not present

## 2016-02-17 DIAGNOSIS — S01411A Laceration without foreign body of right cheek and temporomandibular area, initial encounter: Secondary | ICD-10-CM | POA: Diagnosis not present

## 2016-02-17 DIAGNOSIS — Y92009 Unspecified place in unspecified non-institutional (private) residence as the place of occurrence of the external cause: Secondary | ICD-10-CM | POA: Diagnosis not present

## 2016-02-17 DIAGNOSIS — Z23 Encounter for immunization: Secondary | ICD-10-CM

## 2016-02-17 DIAGNOSIS — Y9389 Activity, other specified: Secondary | ICD-10-CM | POA: Insufficient documentation

## 2016-02-17 MED ORDER — LIDOCAINE-EPINEPHRINE (PF) 1 %-1:200000 IJ SOLN
10.0000 mL | Freq: Once | INTRAMUSCULAR | Status: AC
  Administered 2016-02-17: 10 mL
  Filled 2016-02-17: qty 30

## 2016-02-17 MED ORDER — TETANUS-DIPHTH-ACELL PERTUSSIS 5-2.5-18.5 LF-MCG/0.5 IM SUSP
0.5000 mL | Freq: Once | INTRAMUSCULAR | Status: AC
  Administered 2016-02-17: 0.5 mL via INTRAMUSCULAR
  Filled 2016-02-17: qty 0.5

## 2016-02-17 MED ORDER — CEPHALEXIN 500 MG PO CAPS: 500.0000 mg | ORAL_CAPSULE | Freq: Three times a day (TID) | ORAL | 0 refills | Status: DC

## 2016-02-17 NOTE — ED Triage Notes (Signed)
Pt to ed with c/o laceration to right cheek.  Pt states she cut it on a mirror at home.

## 2016-02-17 NOTE — ED Provider Notes (Signed)
[facility] Emergency Department Provider Note  ____________________________________________  Time seen: Approximately 3:54 PM  I have reviewed the triage vital signs and the nursing notes.   HISTORY  Chief Complaint Facial Laceration    HPI Debra Richmond is a 25 y.o. female , NAD, presents to the emergency department with a one-hour history of laceration to the right cheek. States she has a Ship broker that does not have a frame around it. She bent over to pick something up off the floor and cut her right cheek on the side of the mirror. Denies any foreign bodies or shards from the mere being in her face. Denies any laceration of the eye or eye trauma. Has not had any numbness, weakness, tingling. Denies pain about the face other than at the laceration site. Denies head injury, LOC, dizziness. Has had a tetanus vaccine but uncertain if it has been within the last 5 years.   Past Medical History:  Diagnosis Date  . GERD (gastroesophageal reflux disease)   . PCOD (polycystic ovarian disease)   . Rotator cuff disorder    right shoulder    Patient Active Problem List   Diagnosis Date Noted  . Cutaneous abscess of left upper extremity 01/21/2016  . PCOS (polycystic ovarian syndrome) 01/14/2016  . Right shoulder strain 08/27/2015  . Right shoulder tendinitis 08/27/2015  . Gastro-esophageal reflux disease with esophagitis 08/14/2012    Past Surgical History:  Procedure Laterality Date  . CHOLECYSTECTOMY  10/19/2012    Prior to Admission medications   Medication Sig Start Date End Date Taking? Authorizing Provider  cephALEXin (KEFLEX) 500 MG capsule Take 1 capsule (500 mg total) by mouth 3 (three) times daily. 02/17/16   Belen Zwahlen L Alizae Bechtel, PA-C  mupirocin cream (BACTROBAN) 2 % Apply 1 application topically 2 (two) times daily. 01/21/16   Ricarda Frame, MD    Allergies Prednisone; Dilaudid [hydromorphone hcl]; Hydromorphone; Garlic; Clindamycin; and Sulfa  antibiotics  Family History  Problem Relation Age of Onset  . Lupus Mother     Social History Social History  Substance Use Topics  . Smoking status: Current Every Day Smoker    Packs/day: 0.50    Types: Cigarettes  . Smokeless tobacco: Never Used  . Alcohol use Yes     Comment: rarely     Review of Systems  Constitutional: No fever/chills Eyes: No visual changes. Musculoskeletal: Negative for facial pain.  Skin: Positive laceration right cheek. Negative for rash, redness, swelling, bruising. Neurological: Negative for headaches. No numbness, weakness, tingling. No LOC, dizziness. 10-point ROS otherwise negative.  ____________________________________________   PHYSICAL EXAM:  VITAL SIGNS: ED Triage Vitals  Enc Vitals Group     BP 02/17/16 1516 132/71     Pulse Rate 02/17/16 1516 91     Resp 02/17/16 1516 18     Temp 02/17/16 1516 98.1 F (36.7 C)     Temp Source 02/17/16 1516 Oral     SpO2 02/17/16 1516 100 %     Weight 02/17/16 1512 134 lb (60.8 kg)     Height 02/17/16 1512 5\' 5"  (1.651 m)     Head Circumference --      Peak Flow --      Pain Score 02/17/16 1513 0     Pain Loc --      Pain Edu? --      Excl. in GC? --      Constitutional: Alert and oriented. Well appearing and in no acute distress. Eyes: Conjunctivae are normal.  PERRL. EOMI without pain.  Head: Normocephalic. No tenderness to palpation of the right cheek or orbital region. Cardiovascular: Good peripheral circulation. Respiratory: Normal respiratory effort without tachypnea or retractions.  Neurologic:  Normal speech and language. No gross focal neurologic deficits are appreciated.  Skin:  3.5cm Linear, clean, nonbleeding laceration to the right cheek. No surrounding erythema, swelling, bruising. Skin is warm, dry. No rash noted. Psychiatric: Mood and affect are normal. Speech and behavior are normal. Patient exhibits appropriate insight and  judgement.   ____________________________________________   LABS  None ____________________________________________  EKG  None ____________________________________________  RADIOLOGY  None ____________________________________________    PROCEDURES  Procedure(s) performed: None   .Marland Kitchen.Laceration Repair Date/Time: 02/17/2016 4:42 PM Performed by: Hope PigeonHAGLER, Chicquita Mendel L Authorized by: Hope PigeonHAGLER, Treyvone Chelf L   Consent:    Consent obtained:  Verbal   Consent given by:  Patient   Risks discussed:  Infection, pain, poor cosmetic result and poor wound healing   Alternatives discussed:  No treatment and referral Anesthesia (see MAR for exact dosages):    Anesthesia method:  Local infiltration   Local anesthetic:  Lidocaine 1% WITH epi Laceration details:    Location:  Face   Face location:  R cheek   Length (cm):  3.5   Depth (mm):  4 Repair type:    Repair type:  Simple Pre-procedure details:    Preparation:  Patient was prepped and draped in usual sterile fashion Exploration:    Hemostasis achieved with:  Direct pressure (Hemostasis achieved prior to arrival in the emergency department)   Wound exploration: wound explored through full range of motion and entire depth of wound probed and visualized     Wound extent: no foreign bodies/material noted, no muscle damage noted, no nerve damage noted and no tendon damage noted     Contaminated: no   Treatment:    Area cleansed with:  Betadine   Amount of cleaning:  Standard   Irrigation solution:  Sterile saline   Irrigation volume:  50   Irrigation method:  Syringe   Foreign body removal: No foreign bodies.   Skin repair:    Repair method:  Sutures   Suture size:  5-0   Suture material:  Nylon   Suture technique:  Simple interrupted   Number of sutures:  9 Approximation:    Approximation:  Close   Vermilion border: well-aligned   Post-procedure details:    Dressing:  Non-adherent dressing   Patient tolerance of procedure:   Tolerated well, no immediate complications     Medications  lidocaine-EPINEPHrine (XYLOCAINE-EPINEPHrine) 1 %-1:200000 (PF) injection 10 mL (10 mLs Infiltration Given by Other 02/17/16 1707)  Tdap (BOOSTRIX) injection 0.5 mL (0.5 mLs Intramuscular Given 02/17/16 1648)     ____________________________________________   INITIAL IMPRESSION / ASSESSMENT AND PLAN / ED COURSE  Pertinent labs & imaging results that were available during my care of the patient were reviewed by me and considered in my medical decision making (see chart for details).  Clinical Course    Patient's diagnosis is consistent with Laceration of right cheek and need for tetanus booster. Patient will be discharged home with prescriptions for Keflex to take as directed. May take over-the-counter Tylenol or ibuprofen as needed for pain. Patient is to keep wound clean and dry 48 hours and then may cleanse lightly per usual. Patient is to follow up with her primary care provider [facility] hours for recheck and again in 5-7 days for suture removal. Patient is  given ED precautions to return to the ED for any worsening or new symptoms.    ____________________________________________  FINAL CLINICAL IMPRESSION(S) / ED DIAGNOSES  Final diagnoses:  Cheek laceration, right, initial encounter  Need for tetanus booster      NEW MEDICATIONS STARTED DURING THIS VISIT:  Discharge Medication List as of 02/17/2016  5:00 PM    START taking these medications   Details  cephALEXin (KEFLEX) 500 MG capsule Take 1 capsule (500 mg total) by mouth 3 (three) times daily., Starting Tue 02/17/2016, Print             Ernestene Kiel [place], PA-C 02/17/16 1748    Minna Antis, MD 02/17/16 Jerene Bears

## 2016-02-17 NOTE — Discharge Instructions (Signed)
Keep wound clean and dry x 48 hours then may lightly cleanse per usual.   Please see your PCP or [facility] recheck in 2 days and then again in 5-7 days for suture removal.

## 2016-02-18 NOTE — Telephone Encounter (Signed)
I have sent a referral to [facility] [address]  [place], [place] [address] Main: [phone] through [facility].  I have spoke with Debra CheMargaret, the referral cordinator for the department and she stated that the patient should be called within 5 business days with an appointment. If for some reason the referral get denied at review, she will contact me.  I have called patient and given her the information.

## 2016-02-18 NOTE — Telephone Encounter (Signed)
Please refer to:  [facility] [address]  [place], [place] [address]  Main: [phone]   Thank you.

## 2016-04-19 ENCOUNTER — Ambulatory Visit (INDEPENDENT_AMBULATORY_CARE_PROVIDER_SITE_OTHER): Payer: BLUE CROSS/BLUE SHIELD | Admitting: Surgery

## 2016-04-19 ENCOUNTER — Other Ambulatory Visit: Payer: Self-pay

## 2016-04-19 ENCOUNTER — Encounter: Payer: Self-pay | Admitting: Surgery

## 2016-04-19 VITALS — BP 126/77 | HR 76 | Temp 97.8°F | Ht 64.0 in | Wt 136.0 lb

## 2016-04-19 DIAGNOSIS — L0233 Carbuncle of buttock: Secondary | ICD-10-CM | POA: Diagnosis not present

## 2016-04-19 MED ORDER — DOXYCYCLINE HYCLATE 100 MG PO TABS: 100.0000 mg | ORAL_TABLET | Freq: Two times a day (BID) | ORAL | 0 refills | Status: DC

## 2016-04-19 NOTE — Patient Instructions (Addendum)
We have changed your antibiotic to Doxycycline. You will take this twice daily until it is completely gone.   You have also been given Chlorhexadine wash. Use this throughout your body in the place of your body wash to help decrease area of concern and to keep other areas from coming up. Shower with this every day.  Follow-up as scheduled below.

## 2016-04-19 NOTE — Progress Notes (Signed)
Is a 25 year old well known to our practice with a history of recurrent skin and soft tissue infections. She was recently referred to ID but unfortunately cannot afford and to be seen with twice a day practice secondary to lack of insurance. He continues to smoke and. She was recently seen by her PCP for a soft tissue infection on her right gluteal area. No fevers or chills He was placed on Augmentin.  PE NAD Abd: soft, NT Akin: small carbuncle 1x 1.5 cm w some induration but no fluctuance, no evidence of necrotizing infection and minimal cellultiis  A/P Carbuncle Advise about smoking cessation Hibiclens shower Doxycycline in addition to Augmentin to cover MRSA No need for surgical intervention. Return to the office 10 days or so for follow-up

## 2016-05-12 ENCOUNTER — Ambulatory Visit: Payer: Self-pay | Admitting: Surgery

## 2016-07-02 ENCOUNTER — Ambulatory Visit (INDEPENDENT_AMBULATORY_CARE_PROVIDER_SITE_OTHER): Payer: BLUE CROSS/BLUE SHIELD

## 2016-07-02 ENCOUNTER — Ambulatory Visit
Admission: EM | Admit: 2016-07-02 | Discharge: 2016-07-02 | Disposition: A | Discharge status: Home / Self Care | Payer: BLUE CROSS/BLUE SHIELD | Attending: Family Medicine | Admitting: Family Medicine

## 2016-07-02 DIAGNOSIS — S81012A Laceration without foreign body, left knee, initial encounter: Secondary | ICD-10-CM

## 2016-07-02 DIAGNOSIS — S8002XA Contusion of left knee, initial encounter: Secondary | ICD-10-CM

## 2016-07-02 HISTORY — DX: Anxiety disorder, unspecified: F41.9

## 2016-07-02 NOTE — ED Triage Notes (Signed)
Pt was walking her dog when he took off after a cat. Fell onto left knee sustaining a laceration. Bleeding controlled in triage.

## 2016-07-02 NOTE — ED Notes (Signed)
Wound was irrigated with NS and Hibiclens.

## 2016-07-02 NOTE — Discharge Instructions (Addendum)
Follow up in 10 days for suture removal or sooner if any problems

## 2016-07-26 ENCOUNTER — Ambulatory Visit
Admission: EM | Admit: 2016-07-26 | Discharge: 2016-07-26 | Disposition: A | Discharge status: Home / Self Care | Payer: BLUE CROSS/BLUE SHIELD | Attending: Family Medicine | Admitting: Family Medicine

## 2016-07-26 DIAGNOSIS — L02429 Furuncle of limb, unspecified: Secondary | ICD-10-CM

## 2016-07-26 DIAGNOSIS — L03113 Cellulitis of right upper limb: Secondary | ICD-10-CM

## 2016-07-26 MED ORDER — ONDANSETRON 8 MG PO TBDP: 8.0000 mg | ORAL_TABLET | Freq: Three times a day (TID) | ORAL | 0 refills | Status: DC | PRN

## 2016-07-26 MED ORDER — DOXYCYCLINE HYCLATE 100 MG PO TABS: 100.0000 mg | ORAL_TABLET | Freq: Two times a day (BID) | ORAL | 0 refills | Status: DC

## 2016-07-26 NOTE — ED Triage Notes (Signed)
Patient complains of abscess on her right shoulder. Patient states that she has MRSA. Patient states that she noticed this 4 days ago. Patient states that originally was pus filled. Patient states that she was on keflex when this occurred. Patient states that she had an eye abscess last week.

## 2016-07-26 NOTE — ED Provider Notes (Signed)
[facility]    CSN: [id number] Arrival date & time: 07/26/16  1746     History   Chief Complaint Chief Complaint  Patient presents with  . Abscess    HPI Debra Richmond is a 26 y.o. female.   The history is provided by the patient.  Abscess  Location:  Shoulder/arm Shoulder/arm abscess location:  R shoulder Size:  3cm Abscess quality: draining (states has been slightly draining pus), induration, painful, redness and warmth   Abscess quality: no fluctuance   Red streaking: no   Duration:  4 days Progression:  Worsening Pain details:    Quality:  Hot   Severity:  Mild Chronicity:  New Context: not diabetes, not immunosuppression, not injected drug use, not insect bite/sting and not skin injury   Relieved by:  Nothing Ineffective treatments:  Topical antibiotics Associated symptoms: no fever   Risk factors: hx of MRSA     Past Medical History:  Diagnosis Date  . Anxiety   . GERD (gastroesophageal reflux disease)   . PCOD (polycystic ovarian disease)   . Rotator cuff disorder    right shoulder    Patient Active Problem List   Diagnosis Date Noted  . Cutaneous abscess of left upper extremity 01/21/2016  . PCOS (polycystic ovarian syndrome) 01/14/2016  . Right shoulder strain 08/27/2015  . Right shoulder tendinitis 08/27/2015  . Gastro-esophageal reflux disease with esophagitis 08/14/2012    Past Surgical History:  Procedure Laterality Date  . CHOLECYSTECTOMY  10/19/2012    OB History    No data available       Home Medications    Prior to Admission medications   Medication Sig Start Date End Date Taking? Authorizing Provider  clonazePAM (KLONOPIN) 0.5 MG tablet Take 1 tablet by mouth as needed. 04/16/16 07/26/16 Yes Historical Provider, MD  doxycycline (VIBRA-TABS) 100 MG tablet Take 1 tablet (100 mg total) by mouth 2 (two) times daily. 07/26/16   Payton Mccallum, MD  mupirocin cream (BACTROBAN) 2 % Apply 1 application topically 2 (two)  times daily. 01/21/16   Ricarda Frame, MD  ondansetron (ZOFRAN ODT) 8 MG disintegrating tablet Take 1 tablet (8 mg total) by mouth every 8 (eight) hours as needed. 07/26/16   Payton Mccallum, MD    Family History Family History  Problem Relation Age of Onset  . Lupus Mother     Social History Social History  Substance Use Topics  . Smoking status: Current Every Day Smoker    Packs/day: 1.00    Types: Cigarettes  . Smokeless tobacco: Never Used  . Alcohol use Yes     Comment: rarely     Allergies   Prednisone; Dilaudid [hydromorphone hcl]; Hydromorphone; Doxycycline; Garlic; Clindamycin; and Sulfa antibiotics   Review of Systems Review of Systems  Constitutional: Negative for fever.     Physical Exam Triage Vital Signs ED Triage Vitals  Enc Vitals Group     BP 07/26/16 1946 123/77     Pulse Rate 07/26/16 1946 70     Resp 07/26/16 1946 17     Temp 07/26/16 1946 98 F (36.7 C)     Temp Source 07/26/16 1946 Oral     SpO2 07/26/16 1946 100 %     Weight 07/26/16 1944 135 lb (61.2 kg)     Height 07/26/16 1944 5\' 4"  (1.626 m)     Head Circumference --      Peak Flow --      Pain Score 07/26/16 1946 7  Pain Loc --      Pain Edu? --      Excl. in GC? --    No data found.   Updated Vital Signs BP 123/77 (BP Location: Left Arm)   Pulse 70   Temp 98 F (36.7 C) (Oral)   Resp 17   Ht 5\' 4"  (1.626 m)   Wt 135 lb (61.2 kg)   SpO2 100%   BMI 23.17 kg/m   Visual Acuity Right Eye Distance:   Left Eye Distance:   Bilateral Distance:    Right Eye Near:   Left Eye Near:    Bilateral Near:     Physical Exam  Constitutional: She appears well-developed and well-nourished. No distress.  Skin: She is not diaphoretic. There is erythema.  Approximately 3cm superficial boil lesion on right shoulder with warmth, tenderness to palpation and surrounding blanchable erythema; currently no drainage; no fluctuance  Nursing note and vitals reviewed.    UC Treatments /  Results  Labs (all labs ordered are listed, but only abnormal results are displayed) Labs Reviewed - No data to display  EKG  EKG Interpretation None       Radiology No results found.  Procedures Procedures (including critical care time)  Medications Ordered in UC Medications - No data to display   Initial Impression / Assessment and Plan / UC Course  I have reviewed the triage vital signs and the nursing notes.  Pertinent labs & imaging results that were available during my care of the patient were reviewed by me and considered in my medical decision making (see chart for details).        Final Clinical Impressions(s) / UC Diagnoses   Final diagnoses:  Boil of upper extremity  Cellulitis of right upper extremity    New Prescriptions New Prescriptions   DOXYCYCLINE (VIBRA-TABS) 100 MG TABLET    Take 1 tablet (100 mg total) by mouth 2 (two) times daily.   ONDANSETRON (ZOFRAN ODT) 8 MG DISINTEGRATING TABLET    Take 1 tablet (8 mg total) by mouth every 8 (eight) hours as needed.    1.  diagnosis reviewed with patient 2. rx as per orders above; reviewed possible side effects, interactions, risks and benefits  3. Recommend supportive treatment with warm compresses to area 4. Follow-up prn if symptoms worsen or don't improve   Payton Mccallumrlando Deshonda Cryderman, MD 07/26/16 2018

## 2016-08-02 ENCOUNTER — Encounter: Payer: Self-pay | Admitting: Surgery

## 2016-08-02 ENCOUNTER — Ambulatory Visit (INDEPENDENT_AMBULATORY_CARE_PROVIDER_SITE_OTHER): Payer: BLUE CROSS/BLUE SHIELD | Admitting: Surgery

## 2016-08-02 VITALS — BP 118/76 | HR 80 | Temp 98.4°F | Ht 64.0 in | Wt 138.4 lb

## 2016-08-02 DIAGNOSIS — L0293 Carbuncle, unspecified: Secondary | ICD-10-CM

## 2016-08-02 NOTE — Progress Notes (Signed)
Debra Richmond Is a 26 year old well known to our practice with a history of recurrent skin and soft tissue infections.  About 6 days ago had any soft tissue infection the right shoulder and has been started on antibiotics. She reports that she has intermittent moderate pain no fevers or chills. Eyes she actually was able to express some purulent material. Now her symptoms are improving.  PE NAD Abd: soft, NT Skin: Right shoulder small carbuncle 1x 0.5 cm w some induration but no fluctuance, no evidence of necrotizing infection and minimal cellultiis  A/P Carbuncle Advise about smoking cessation Continue A/bs  No need for surgical intervention. ID referal

## 2016-08-02 NOTE — Patient Instructions (Signed)
WE  will send another referral to [facility]. Someone from their office will contact you with an appointment. Please continue taking all antibiotics. Please call our office if you have questions or concerns.

## 2016-08-12 ENCOUNTER — Telehealth: Payer: Self-pay

## 2016-08-12 NOTE — Telephone Encounter (Signed)
I have faxed a referral to [facility] Phone #: [phone] Fax #: [phone] & received a confirmation on 08/05/16.    I will follow up within 3-5 days to make sure the appointments have been scheduled. .Marland Kitchen

## 2016-09-01 NOTE — Telephone Encounter (Signed)
An appointment was scheduled for the patient on 09/02/16. Patient has cancelled this appointment through them.

## 2016-09-02 ENCOUNTER — Ambulatory Visit: Payer: BLUE CROSS/BLUE SHIELD | Admitting: Internal Medicine

## 2016-10-06 NOTE — ED Provider Notes (Signed)
[facility]    CSN: [id number] Arrival date & time: 07/02/16  1520     History   Chief Complaint Chief Complaint  Patient presents with  . Extremity Laceration    HPI Debra Richmond is a 26 y.o. female.   26 yo female with a c/o left knee pain and skin laceration/abrasion after falling today while walking her dog. Patient states she's up to date on her tetanus vaccine.    The history is provided by the patient.    Past Medical History:  Diagnosis Date  . Anxiety   . GERD (gastroesophageal reflux disease)   . PCOD (polycystic ovarian disease)   . Rotator cuff disorder    right shoulder    Patient Active Problem List   Diagnosis Date Noted  . Cutaneous abscess of left upper extremity 01/21/2016  . PCOS (polycystic ovarian syndrome) 01/14/2016  . Right shoulder strain 08/27/2015  . Right shoulder tendinitis 08/27/2015  . Gastro-esophageal reflux disease with esophagitis 08/14/2012    Past Surgical History:  Procedure Laterality Date  . CHOLECYSTECTOMY  10/19/2012    OB History    No data available       Home Medications    Prior to Admission medications   Medication Sig Start Date End Date Taking? Authorizing Provider  clonazePAM (KLONOPIN) 0.5 MG tablet Take 1 tablet by mouth as needed. 04/16/16 07/26/16  [provider]  doxycycline (VIBRA-TABS) 100 MG tablet Take 1 tablet (100 mg total) by mouth 2 (two) times daily. 07/26/16   Payton Mccallumonty, Krystyl Cannell, MD  mupirocin cream (BACTROBAN) 2 % Apply 1 application topically 2 (two) times daily. 01/21/16   Ricarda FrameWoodham, Charles, MD  ondansetron (ZOFRAN ODT) 8 MG disintegrating tablet Take 1 tablet (8 mg total) by mouth every 8 (eight) hours as needed. 07/26/16   Payton Mccallumonty, Delmont Prosch, MD    Family History Family History  Problem Relation Age of Onset  . Lupus Mother     Social History Social History  Substance Use Topics  . Smoking status: Current Every Day Smoker    Packs/day: 1.00    Types: Cigarettes  .  Smokeless tobacco: Never Used  . Alcohol use Yes     Comment: rarely     Allergies   Prednisone; Dilaudid [hydromorphone hcl]; Hydromorphone; Doxycycline; Garlic; Clindamycin; and Sulfa antibiotics   Review of Systems Review of Systems   Physical Exam Triage Vital Signs ED Triage Vitals  Enc Vitals Group     BP 07/02/16 1536 129/76     Pulse Rate 07/02/16 1536 71     Resp 07/02/16 1536 16     Temp 07/02/16 1536 98.6 F (37 C)     Temp Source 07/02/16 1536 Oral     SpO2 07/02/16 1536 100 %     Weight 07/02/16 1536 135 lb (61.2 kg)     Height 07/02/16 1536 5\' 4"  (1.626 m)     Head Circumference --      Peak Flow --      Pain Score 07/02/16 1538 2     Pain Loc --      Pain Edu? --      Excl. in GC? --    No data found.   Updated Vital Signs BP 129/76 (BP Location: Left Arm)   Pulse 71   Temp 98.6 F (37 C) (Oral)   Resp 16   Ht 5\' 4"  (1.626 m)   Wt 135 lb (61.2 kg)   LMP 05/24/2016 (Within Days)   SpO2  100%   BMI 23.17 kg/m   Visual Acuity Right Eye Distance:   Left Eye Distance:   Bilateral Distance:    Right Eye Near:   Left Eye Near:    Bilateral Near:     Physical Exam  Constitutional: She appears well-developed and well-nourished. No distress.  Musculoskeletal:       Left knee: She exhibits swelling, laceration (1.5cm) and bony tenderness. She exhibits normal range of motion, no effusion, no ecchymosis, no deformity, no erythema, normal alignment, no LCL laxity, normal patellar mobility, normal meniscus and no MCL laxity. Tenderness (over patella) found.  Skin: She is not diaphoretic.  Nursing note and vitals reviewed.    UC Treatments / Results  Labs (all labs ordered are listed, but only abnormal results are displayed) Labs Reviewed - No data to display  EKG  EKG Interpretation None       Radiology No results found.  Procedures .Marland KitchenLaceration Repair Date/Time: 10/06/2016 9:05 PM Performed by: Payton Mccallum Authorized by: Payton Mccallum   Consent:    Consent obtained:  Verbal   Consent given by:  Patient   Risks discussed:  Infection, need for additional repair, poor cosmetic result, tendon damage, retained foreign body, pain and poor wound healing   Alternatives discussed:  No treatment Anesthesia (see MAR for exact dosages):    Anesthesia method:  Local infiltration   Local anesthetic:  Lidocaine 1% w/o epi Laceration details:    Location:  Leg   Leg location:  L knee   Length (cm):  1.5 Repair type:    Repair type:  Simple Pre-procedure details:    Preparation:  Patient was prepped and draped in usual sterile fashion Exploration:    Hemostasis achieved with:  Direct pressure   Wound exploration: wound explored through full range of motion and entire depth of wound probed and visualized     Wound extent: no foreign bodies/material noted, no muscle damage noted, no nerve damage noted, no tendon damage noted, no underlying fracture noted and no vascular damage noted     Contaminated: no   Treatment:    Area cleansed with:  Hibiclens   Amount of cleaning:  Standard   Irrigation solution:  Sterile water   Irrigation method:  Syringe   Visualized foreign bodies/material removed: yes   Skin repair:    Repair method:  Sutures   Suture size:  5-0   Suture material:  Nylon   Suture technique:  Simple interrupted   Number of sutures:  4 Approximation:    Approximation:  Close Post-procedure details:    Dressing:  Non-adherent dressing   Patient tolerance of procedure:  Tolerated well, no immediate complications   (including critical care time)  Medications Ordered in UC Medications - No data to display   Initial Impression / Assessment and Plan / UC Course  I have reviewed the triage vital signs and the nursing notes.  Pertinent labs & imaging results that were available during my care of the patient were reviewed by me and considered in my medical decision making (see chart for details).        Final Clinical Impressions(s) / UC Diagnoses   Final diagnoses:  Laceration of left knee, initial encounter  Contusion of left knee, initial encounter    New Prescriptions Discharge Medication List as of 07/02/2016  5:37 PM     1. x-ray results and diagnosis reviewed with patient 2. Recommend supportive treatment with wound care   3. Follow-up in 10  days for suture removal or sooner prn if symptoms worsen or don't improve   Payton Mccallum, MD 10/06/16 2107

## 2016-10-26 ENCOUNTER — Ambulatory Visit
Admission: EM | Admit: 2016-10-26 | Discharge: 2016-10-26 | Disposition: A | Discharge status: Home / Self Care | Payer: BLUE CROSS/BLUE SHIELD | Attending: Family Medicine | Admitting: Family Medicine

## 2016-10-26 DIAGNOSIS — S39012A Strain of muscle, fascia and tendon of lower back, initial encounter: Secondary | ICD-10-CM

## 2016-10-26 MED ORDER — METAXALONE 800 MG PO TABS: 800.0000 mg | ORAL_TABLET | Freq: Three times a day (TID) | ORAL | 0 refills | Status: DC

## 2016-10-26 MED ORDER — NAPROXEN 500 MG PO TABS: 500.0000 mg | ORAL_TABLET | Freq: Two times a day (BID) | ORAL | 0 refills | Status: DC
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
## 2016-11-01 IMAGING — DX DG FOOT COMPLETE 3+V*R*
3 series · 3 of 3 positions shown · non-contrast
Comparison: 09/04/2013

CLINICAL DATA: Trauma. Patient reports working for a cleaning
company, states she was bringing a computer desk down from its side,
losing control of desk, with it landing on right foot. Now with
throbbing pain to right foot, numbness/tingling to toes, limited
mobility to toes.

EXAM:
RIGHT FOOT COMPLETE - 3+ VIEW

[foot ap]
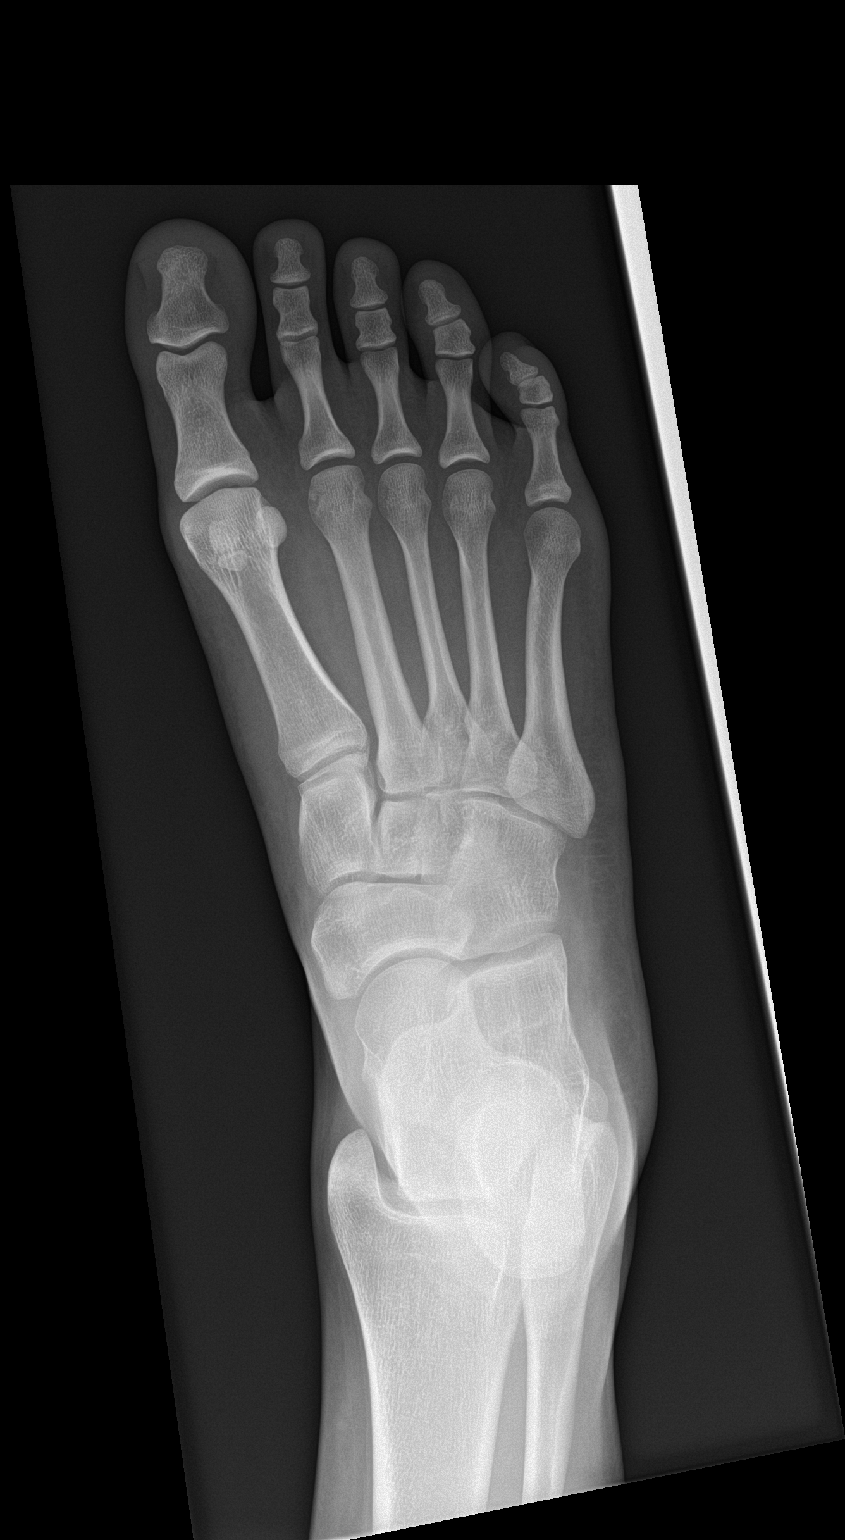

[foot obl]
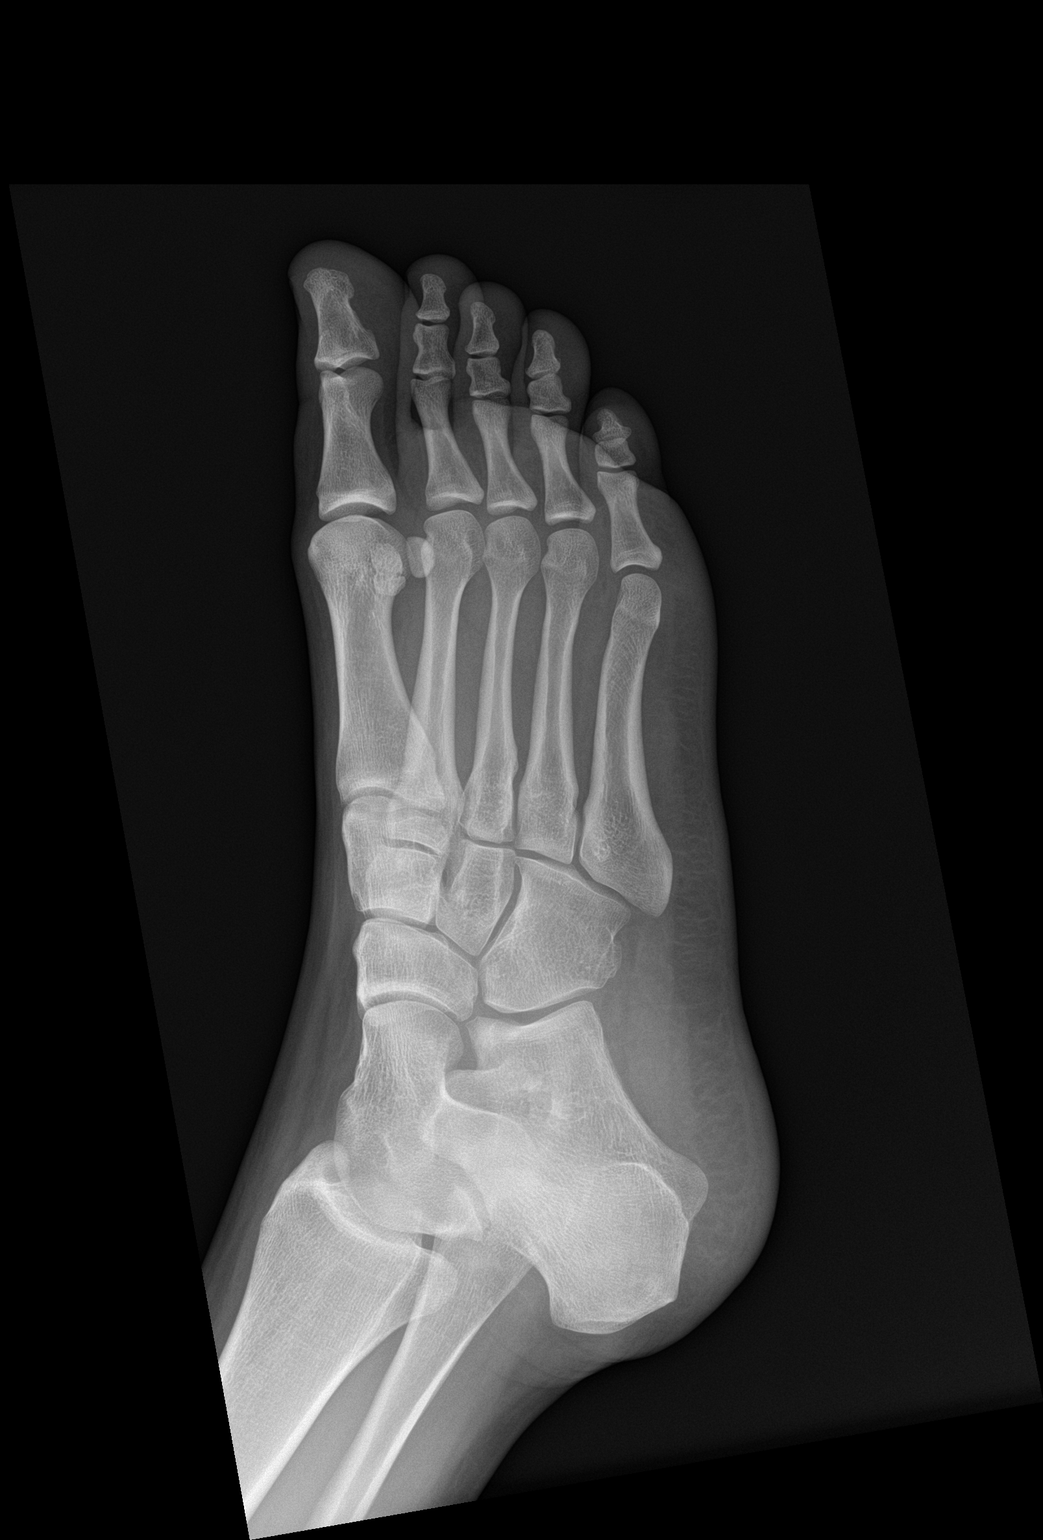

[foot lat]
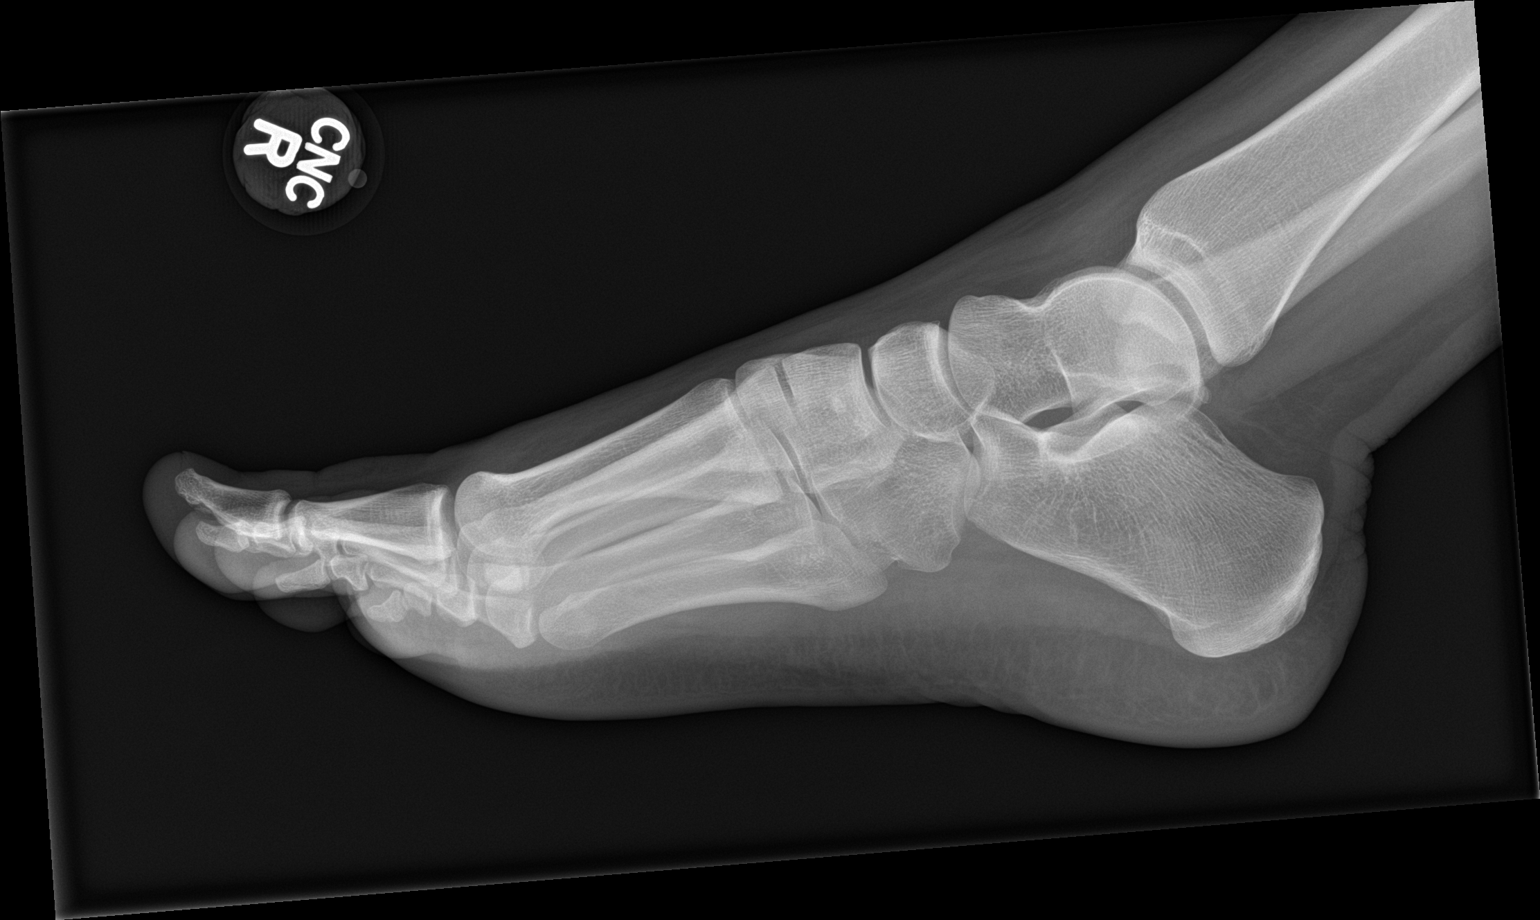

[3 of 3 positions shown; findings below may reference images not displayed]

FINDINGS: There is no evidence of fracture or dislocation. There is no
evidence of arthropathy or other focal bone abnormality. Soft
tissues are unremarkable.
IMPRESSION: Negative radiographs of the right foot.

## 2017-01-20 ENCOUNTER — Emergency Department
Admission: EM | Admit: 2017-01-20 | Discharge: 2017-01-20 | Disposition: A | Discharge status: Home / Self Care | Payer: BLUE CROSS/BLUE SHIELD | Attending: Emergency Medicine | Admitting: Emergency Medicine

## 2017-01-20 ENCOUNTER — Encounter: Payer: Self-pay | Admitting: Medical Oncology

## 2017-01-20 DIAGNOSIS — W268XXA Contact with other sharp object(s), not elsewhere classified, initial encounter: Secondary | ICD-10-CM | POA: Diagnosis not present

## 2017-01-20 DIAGNOSIS — F17210 Nicotine dependence, cigarettes, uncomplicated: Secondary | ICD-10-CM | POA: Diagnosis not present

## 2017-01-20 DIAGNOSIS — S81812A Laceration without foreign body, left lower leg, initial encounter: Secondary | ICD-10-CM | POA: Diagnosis not present

## 2017-01-20 DIAGNOSIS — Y939 Activity, unspecified: Secondary | ICD-10-CM | POA: Diagnosis not present

## 2017-01-20 DIAGNOSIS — J019 Acute sinusitis, unspecified: Secondary | ICD-10-CM | POA: Insufficient documentation

## 2017-01-20 DIAGNOSIS — Y929 Unspecified place or not applicable: Secondary | ICD-10-CM | POA: Insufficient documentation

## 2017-01-20 DIAGNOSIS — Z79899 Other long term (current) drug therapy: Secondary | ICD-10-CM | POA: Diagnosis not present

## 2017-01-20 DIAGNOSIS — Y999 Unspecified external cause status: Secondary | ICD-10-CM | POA: Diagnosis not present

## 2017-01-20 MED ORDER — ALBUTEROL SULFATE HFA 108 (90 BASE) MCG/ACT IN AERS: 2.0000 | INHALATION_SPRAY | Freq: Four times a day (QID) | RESPIRATORY_TRACT | 0 refills | Status: DC | PRN

## 2017-01-20 MED ORDER — BENZONATATE 100 MG PO CAPS: 100.0000 mg | ORAL_CAPSULE | Freq: Three times a day (TID) | ORAL | 0 refills | Status: DC | PRN

## 2017-01-20 MED ORDER — LIDOCAINE HCL (PF) 1 % IJ SOLN
5.0000 mL | Freq: Once | INTRAMUSCULAR | Status: AC
  Administered 2017-01-20: 5 mL via INTRADERMAL
  Filled 2017-01-20: qty 5

## 2017-01-20 MED ORDER — FLUTICASONE PROPIONATE 50 MCG/ACT NA SUSP: 2.0000 | Freq: Every day | NASAL | 0 refills | Status: DC

## 2017-01-20 MED ORDER — DOXYCYCLINE HYCLATE 50 MG PO CAPS: 100.0000 mg | ORAL_CAPSULE | Freq: Two times a day (BID) | ORAL | 0 refills | Status: AC

## 2017-01-20 NOTE — ED Provider Notes (Signed)
[facility] Emergency Department Provider Note  ____________________________________________  Time seen: Approximately 1:42 PM  I have reviewed the triage vital signs and the nursing notes.   HISTORY  Chief Complaint Laceration    HPI Debra Richmond is a 26 y.o. female that presents to the emergency department for evaluation of left leg laceration and sinus congestion. Patient cut her leg ona piece of plastic in her car this morning. She was on her way to urgent care to be evaluated for sinus congestion. She has had sinus congestion and a non productive cough for one day. She smokes less than one pack of cigarettes per day. Last tetanus shot was in 2017. No fever, shortness of breath, chest pain, nausea, vomiting, abdominal pain, numbness, tingling.   Past Medical History:  Diagnosis Date  . Anxiety   . GERD (gastroesophageal reflux disease)   . PCOD (polycystic ovarian disease)   . Rotator cuff disorder    right shoulder    Patient Active Problem List   Diagnosis Date Noted  . Cutaneous abscess of left upper extremity 01/21/2016  . PCOS (polycystic ovarian syndrome) 01/14/2016  . Right shoulder strain 08/27/2015  . Right shoulder tendinitis 08/27/2015  . Gastro-esophageal reflux disease with esophagitis 08/14/2012    Past Surgical History:  Procedure Laterality Date  . CHOLECYSTECTOMY  10/19/2012    Prior to Admission medications   Medication Sig Start Date End Date Taking? Authorizing Provider  albuterol (PROVENTIL HFA;VENTOLIN HFA) 108 (90 Base) MCG/ACT inhaler Inhale 2 puffs into the lungs every 6 (six) hours as needed for wheezing or shortness of breath. 01/20/17   Enid Derry, PA-C  benzonatate (TESSALON PERLES) 100 MG capsule Take 1 capsule (100 mg total) by mouth 3 (three) times daily as needed for cough. 01/20/17 01/20/18  Enid Derry, PA-C  clonazePAM (KLONOPIN) 0.5 MG tablet Take 1 tablet by mouth as needed. 04/16/16 07/26/16  [provider]  doxycycline (VIBRAMYCIN) 50 MG capsule Take 2 capsules (100 mg total) by mouth 2 (two) times daily. 01/20/17 01/30/17  Enid Derry, PA-C  fluticasone (FLONASE) 50 MCG/ACT nasal spray Place 2 sprays into both nostrils daily. 01/20/17 01/20/18  Enid Derry, PA-C  metaxalone (SKELAXIN) 800 MG tablet Take 1 tablet (800 mg total) by mouth 3 (three) times daily. 10/26/16   Lutricia Feil, PA-C  naproxen (NAPROSYN) 500 MG tablet Take 1 tablet (500 mg total) by mouth 2 (two) times daily with a meal. 10/26/16   Lutricia Feil, PA-C    Allergies Prednisone; Dilaudid [hydromorphone hcl]; Hydromorphone; Doxycycline; Garlic; Clindamycin; and Sulfa antibiotics  Family History  Problem Relation Age of Onset  . Lupus Mother     Social History Social History  Substance Use Topics  . Smoking status: Current Every Day Smoker    Packs/day: 1.00    Types: Cigarettes  . Smokeless tobacco: Never Used  . Alcohol use Yes     Comment: rarely     Review of Systems  Constitutional: No fever/chills ENT: Positive for nasal congestion Cardiovascular: No chest pain. Respiratory: Positive cough. No SOB. Gastrointestinal: No abdominal pain.  No nausea, no vomiting. Skin: Negative for rash, ecchymosis. Positive for laceration. Neurological: Negative for headaches, numbness or tingling   ____________________________________________   PHYSICAL EXAM:  VITAL SIGNS: ED Triage Vitals  Enc Vitals Group     BP 01/20/17 1241 127/77     Pulse Rate 01/20/17 1241 87     Resp 01/20/17 1241 18     Temp 01/20/17 1241 (!)  97.5 F (36.4 C)     Temp Source 01/20/17 1241 Oral     SpO2 01/20/17 1241 100 %     Weight 01/20/17 1239 140 lb (63.5 kg)     Height 01/20/17 1239 5\' 4"  (1.626 m)     Head Circumference --      Peak Flow --      Pain Score 01/20/17 1239 1     Pain Loc --      Pain Edu? --      Excl. in GC? --      Constitutional: Alert and oriented. Well appearing and in no acute  distress. Eyes: Conjunctivae are normal. PERRL. EOMI. Head: Atraumatic. ENT: No frontal or maxillary sinus tenderness.      Ears: Tympanic membranes pearly.      Nose: No congestion/rhinnorhea.      Mouth/Throat: Mucous membranes are moist. Oropharynx non erythematous. Tonsils not enlarged. Uvula midline. Neck: No stridor.   Cardiovascular: Normal rate, regular rhythm.  Good peripheral circulation.  Respiratory: Normal respiratory effort without tachypnea or retractions. Lungs CTAB. Good air entry to the bases with no decreased or absent breath sounds. Musculoskeletal: Full range of motion to all extremities. No gross deformities appreciated. Neurologic:  Normal speech and language. No gross focal neurologic deficits are appreciated.  Skin:  Skin is warm, dry. 2 cm laceration to medial lower left leg.    ____________________________________________   LABS (all labs ordered are listed, but only abnormal results are displayed)  Labs Reviewed - No data to display ____________________________________________  EKG   ____________________________________________  RADIOLOGY  No results found.  ____________________________________________    PROCEDURES  Procedure(s) performed:    Procedures  LACERATION REPAIR Performed by: Enid Derry  Consent: Verbal consent obtained.  Consent given by: patient  Prepped and Draped in normal sterile fashion  Wound explored: No foreign bodies   Laceration Location: medial ankle  Laceration Length: 2 cm  Anesthesia: None  Local anesthetic: lidocaine 1% without epinephrine  Anesthetic total: 2 ml  Irrigation method: syringe  Amount of cleaning: normal saline  Skin closure: 4-0 nylon  Number of sutures: 5  Technique: Simple interrupted  Patient tolerance: Patient tolerated the procedure well with no immediate complications.  Medications  lidocaine (PF) (XYLOCAINE) 1 % injection 5 mL (5 mLs Intradermal Given  01/20/17 1356)     ____________________________________________   INITIAL IMPRESSION / ASSESSMENT AND PLAN / ED COURSE  Pertinent labs & imaging results that were available during my care of the patient were reviewed by me and considered in my medical decision making (see chart for details).  Review of the [place] CSRS was performed in accordance of the [facility] prior to dispensing any controlled drugs.     Patient's diagnosis is consistent with leg laceration and sinusitis. Vital signs and exam are reassuring. Laceration was repaired with stitches. Patient will be discharged home with prescriptions for doxycycline, Flonase, albuterol, Tessalon Perles. Patient is to follow up with PCP as directed. Patient is given ED precautions to return to the ED for any worsening or new symptoms.     ____________________________________________  FINAL CLINICAL IMPRESSION(S) / ED DIAGNOSES  Final diagnoses:  Laceration of left lower extremity, initial encounter  Acute sinusitis, recurrence not specified, unspecified location      NEW MEDICATIONS STARTED DURING THIS VISIT:  Discharge Medication List as of 01/20/2017  1:53 PM    START taking these medications   Details  albuterol (PROVENTIL HFA;VENTOLIN HFA) 108 (90 Base) MCG/ACT inhaler  Inhale 2 puffs into the lungs every 6 (six) hours as needed for wheezing or shortness of breath., Starting Thu 01/20/2017, Print    benzonatate (TESSALON PERLES) 100 MG capsule Take 1 capsule (100 mg total) by mouth 3 (three) times daily as needed for cough., Starting Thu 01/20/2017, Until Fri 01/20/2018, Print    doxycycline (VIBRAMYCIN) 50 MG capsule Take 2 capsules (100 mg total) by mouth 2 (two) times daily., Starting Thu 01/20/2017, Until Sun 01/30/2017, Print    fluticasone (FLONASE) 50 MCG/ACT nasal spray Place 2 sprays into both nostrils daily., Starting Thu 01/20/2017, Until Fri 01/20/2018, Print            This chart was dictated using voice recognition  software/Dragon. Despite best efforts to proofread, errors can occur which can change the meaning. Any change was purely unintentional.    Enid Derry, PA-C 01/20/17 1836    Sharman Cheek, MD 01/22/17 Marlyne Beards

## 2017-01-20 NOTE — ED Triage Notes (Signed)
Pt reports she cut left lower leg on plastic piece on her car seat pta.

## 2017-04-04 ENCOUNTER — Ambulatory Visit
Admission: EM | Admit: 2017-04-04 | Discharge: 2017-04-04 | Disposition: A | Discharge status: Home / Self Care | Payer: BLUE CROSS/BLUE SHIELD | Attending: Family Medicine | Admitting: Family Medicine

## 2017-04-04 DIAGNOSIS — W260XXA Contact with knife, initial encounter: Secondary | ICD-10-CM | POA: Diagnosis not present

## 2017-04-04 DIAGNOSIS — S61012A Laceration without foreign body of left thumb without damage to nail, initial encounter: Secondary | ICD-10-CM

## 2017-04-04 MED ORDER — MUPIROCIN CALCIUM 2 % EX CREA: 1.0000 "application " | TOPICAL_CREAM | Freq: Two times a day (BID) | CUTANEOUS | 0 refills | Status: DC

## 2017-04-04 MED ORDER — CEPHALEXIN 500 MG PO CAPS: 500.0000 mg | ORAL_CAPSULE | Freq: Two times a day (BID) | ORAL | 0 refills | Status: AC
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
## 2017-04-04 NOTE — Discharge Instructions (Signed)
-  Keflex: one tablet twice a day for 5 days -bactroban cream: twice a day with dressing changes -return to clinic in 10 days for suture removal -clean with warm soapy water -return to clinic or see PCP if signs of infection
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
## 2017-04-29 HISTORY — PX: WISDOM TOOTH EXTRACTION: SHX21

## 2017-05-03 ENCOUNTER — Encounter: Payer: Self-pay | Admitting: Emergency Medicine

## 2017-05-03 ENCOUNTER — Emergency Department
Admission: EM | Admit: 2017-05-03 | Discharge: 2017-05-03 | Disposition: A | Discharge status: Home / Self Care | Payer: BLUE CROSS/BLUE SHIELD | Attending: Emergency Medicine | Admitting: Emergency Medicine

## 2017-05-03 ENCOUNTER — Other Ambulatory Visit: Payer: Self-pay

## 2017-05-03 DIAGNOSIS — Z79899 Other long term (current) drug therapy: Secondary | ICD-10-CM | POA: Insufficient documentation

## 2017-05-03 DIAGNOSIS — T421X1A Poisoning by iminostilbenes, accidental (unintentional), initial encounter: Secondary | ICD-10-CM | POA: Diagnosis not present

## 2017-05-03 DIAGNOSIS — T887XXA Unspecified adverse effect of drug or medicament, initial encounter: Secondary | ICD-10-CM | POA: Insufficient documentation

## 2017-05-03 DIAGNOSIS — T50991A Poisoning by other drugs, medicaments and biological substances, accidental (unintentional), initial encounter: Secondary | ICD-10-CM | POA: Diagnosis present

## 2017-05-03 DIAGNOSIS — T50901A Poisoning by unspecified drugs, medicaments and biological substances, accidental (unintentional), initial encounter: Secondary | ICD-10-CM

## 2017-05-03 DIAGNOSIS — F17210 Nicotine dependence, cigarettes, uncomplicated: Secondary | ICD-10-CM | POA: Insufficient documentation

## 2017-05-03 DIAGNOSIS — Y658 Other specified misadventures during surgical and medical care: Secondary | ICD-10-CM | POA: Diagnosis not present

## 2017-05-03 LAB — COMPREHENSIVE METABOLIC PANEL
ALK PHOS: 73 U/L (ref 38–126)
ALT: 24 U/L (ref 14–54)
ANION GAP: 10 (ref 5–15)
AST: 23 U/L (ref 15–41)
Albumin: 4.6 g/dL (ref 3.5–5.0)
BILIRUBIN TOTAL: 0.6 mg/dL (ref 0.3–1.2)
BUN: 18 mg/dL (ref 6–20)
CALCIUM: 9.5 mg/dL (ref 8.9–10.3)
CO2: 23 mmol/L (ref 22–32)
Chloride: 103 mmol/L (ref 101–111)
Creatinine, Ser: 0.7 mg/dL (ref 0.44–1.00)
GFR calc Af Amer: >60 mL/min (ref >60)
GLUCOSE: 95 mg/dL (ref 65–99)
Potassium: 3.8 mmol/L (ref 3.5–5.1)
Sodium: 136 mmol/L (ref 135–145)
TOTAL PROTEIN: 8.1 g/dL (ref 6.5–8.1)

## 2017-05-03 LAB — CBC WITH DIFFERENTIAL/PLATELET
BASOS ABS: 0 10*3/uL (ref 0–0.1)
BASOS PCT: 0 %
EOS PCT: 1 %
Eosinophils Absolute: 0.1 10*3/uL (ref 0–0.7)
HCT: 44 % (ref 35.0–47.0)
Hemoglobin: 15.4 g/dL (ref 12.0–16.0)
Lymphocytes Relative: 10 %
Lymphs Abs: 1.3 10*3/uL (ref 1.0–3.6)
MCH: 32.4 pg (ref 26.0–34.0)
MCHC: 35 g/dL (ref 32.0–36.0)
MCV: 92.3 fL (ref 80.0–100.0)
MONO ABS: 0.7 10*3/uL (ref 0.2–0.9)
Monocytes Relative: 5 %
Neutro Abs: 10.6 10*3/uL — ABNORMAL HIGH (ref 1.4–6.5)
Neutrophils Relative %: 84 %
PLATELETS: 303 10*3/uL (ref 150–440)
RBC: 4.76 MIL/uL (ref 3.80–5.20)
RDW: 12.1 % (ref 11.5–14.5)
WBC: 12.7 10*3/uL — ABNORMAL HIGH (ref 3.6–11.0)

## 2017-05-03 MED ORDER — ONDANSETRON 4 MG PO TBDP
4.0000 mg | ORAL_TABLET | Freq: Once | ORAL | Status: AC
  Administered 2017-05-03: 4 mg via ORAL

## 2017-05-03 MED ORDER — ONDANSETRON 4 MG PO TBDP
ORAL_TABLET | ORAL | Status: AC
  Filled 2017-05-03: qty 1

## 2017-05-03 MED ORDER — SODIUM CHLORIDE 0.9 % IV SOLN
Freq: Once | INTRAVENOUS | Status: AC
  Administered 2017-05-03: 13:00:00 via INTRAVENOUS

## 2017-05-03 NOTE — ED Triage Notes (Signed)
Pt to ED c/o accidental ingestion of primidone 250mg  last night around 9pm.  States light headed, palpitations, n/v x3 today.  Had wisdom teeth removed this last Friday and her pain medicine got mixed up.

## 2017-05-03 NOTE — Discharge Instructions (Signed)
Follow up with your regular doctor if not better in 3-5 days, drink plenty of fluids, inspect your medications carefully prior to taking them, return to the emergency department if you're worsening

## 2017-05-03 NOTE — ED Notes (Signed)
VORB for 4mg  ODT zofran.  No other orders or blood needed at this time per Dr. Mayford KnifeWilliams.

## 2017-05-03 NOTE — ED Provider Notes (Signed)
[facility] Emergency Department Provider Note  ____________________________________________   First MD Initiated Contact with Patient 05/03/17 1200     (approximate)  I have reviewed the triage vital signs and the nursing notes.   HISTORY  Chief Complaint Ingestion    HPI Debra Richmond is a 26 y.o. female states that she meant to take her Tylenol 3 for pain and got her grandfathers Parkinson's pill next up with her ears, states she took primidone 250 mg after that she says she felt like she was "tripping", she has been unsteady on her feet today, she has had nausea and vomiting 3 today she did have some lightheadedness and palpitations that has resolved, she is just concerned she is going to have a permanent brain injury   Past Medical History:  Diagnosis Date  . Anxiety   . GERD (gastroesophageal reflux disease)   . PCOD (polycystic ovarian disease)   . Rotator cuff disorder    right shoulder    Patient Active Problem List   Diagnosis Date Noted  . Cutaneous abscess of left upper extremity 01/21/2016  . PCOS (polycystic ovarian syndrome) 01/14/2016  . Right shoulder strain 08/27/2015  . Right shoulder tendinitis 08/27/2015  . Gastro-esophageal reflux disease with esophagitis 08/14/2012    Past Surgical History:  Procedure Laterality Date  . CHOLECYSTECTOMY  10/19/2012  . WISDOM TOOTH EXTRACTION Bilateral 04/29/2017   upper    Prior to Admission medications   Medication Sig Start Date End Date Taking? Authorizing Provider  albuterol (PROVENTIL HFA;VENTOLIN HFA) 108 (90 Base) MCG/ACT inhaler Inhale 2 puffs into the lungs every 6 (six) hours as needed for wheezing or shortness of breath. 01/20/17   Laban Emperor, PA-C  clonazePAM (KLONOPIN) 0.5 MG tablet Take 1 tablet by mouth as needed. 04/16/16 04/04/17  [provider]    Allergies Prednisone; Dilaudid [hydromorphone hcl]; Hydromorphone; Doxycycline; Garlic; Clindamycin; and  Sulfa antibiotics  Family History  Problem Relation Age of Onset  . Lupus Mother     Social History Social History   Tobacco Use  . Smoking status: Current Every Day Smoker    Packs/day: 1.00    Types: Cigarettes  . Smokeless tobacco: Never Used  Substance Use Topics  . Alcohol use: Yes    Comment: rarely  . Drug use: No    Review of Systems  Constitutional: No fever/chills, positive for being off balance Eyes: No visual changes. ENT: No sore throat. Respiratory: Denies cough Abd: Positive for vomiting 3 Genitourinary: Negative for dysuria. Musculoskeletal: Negative for back pain. Skin: Negative for rash.    ____________________________________________   PHYSICAL EXAM:  VITAL SIGNS: ED Triage Vitals  Enc Vitals Group     BP 05/03/17 1132 (!) 136/91     Pulse Rate 05/03/17 1132 72     Resp 05/03/17 1132 18     Temp 05/03/17 1132 97.6 F (36.4 C)     Temp Source 05/03/17 1132 Oral     SpO2 05/03/17 1132 100 %     Weight 05/03/17 1136 143 lb (64.9 kg)     Height 05/03/17 1136 _0  (1.626 m)     Head Circumference --      Peak Flow --      Pain Score --      Pain Loc --      Pain Edu? --      Excl. in [place]? --     Constitutional: Alert and oriented. Well appearing and in no acute distress. Eyes:  Conjunctivae are normal.  Head: Atraumatic. Nose: No congestion/rhinnorhea. Mouth/Throat: Mucous membranes are moist.   Cardiovascular: Normal rate, regular rhythm. Respiratory: Normal respiratory effort.  No retractions GU: deferred Musculoskeletal: FROM all extremities, warm and well perfused Neurologic:  Normal speech and language. Patient is a little unsteady when she is standing, she is able to walk forwards but is unsteady, grips are equal bilaterally Skin:  Skin is warm, dry and intact. No rash noted. Psychiatric: Mood and affect are normal. Speech and behavior are normal.  ____________________________________________   LABS (all labs ordered are  listed, but only abnormal results are displayed)  Labs Reviewed  CBC WITH DIFFERENTIAL/PLATELET - Abnormal; Notable for the following components:      Result Value   WBC 12.7 (*)    Neutro Abs 10.6 (*)    All other components within normal limits  COMPREHENSIVE METABOLIC PANEL   ____________________________________________   ____________________________________________  RADIOLOGY    ____________________________________________   PROCEDURES  Procedure(s) performed: No      ____________________________________________   INITIAL IMPRESSION / ASSESSMENT AND PLAN / ED COURSE  Pertinent labs & imaging results that were available during my care of the patient were reviewed by me and considered in my medical decision making (see chart for details).  Since 26 year old female who accidentally took her grandfathers primidone last night, she appears well but is a little unsteady on her feet, normal saline 1 L IV given, Zofran was given in triage, CBC and met c were ordered ----------------------------------------- 2:20 PM on 05/03/2017 -----------------------------------------  Called poison control and talked with Barnett Applebaum, she states that the medication should wear off within 13-15 hrs., if the patient is able to ambulate on her own and is back to normal we may discharge her, after the IV fluids patient states she feels much better, she was able to ambulate to the bathroom on her own, all of her labs are normal, she was instructed to carefully inspect her medications before taking them, she is to discontinue taking the Tylenol with Codeine as it interacts with the primidone that she accidentally took, she is to take Advil as needed for pain, patient states she understands and will comply with the recommendations     ____________________________________________   FINAL CLINICAL IMPRESSION(S) / ED DIAGNOSES  Final diagnoses:  Accidental drug ingestion, initial encounter       NEW MEDICATIONS STARTED DURING THIS VISIT:  This SmartLink is deprecated. Use AVSMEDLIST instead to display the medication list for a patient.   Note:  This document was prepared using Dragon voice recognition software and may include unintentional dictation errors.    Versie Starks, PA-C 05/03/17 1423    Earleen Newport, MD 05/03/17 [phone]

## 2017-06-06 ENCOUNTER — Other Ambulatory Visit: Payer: Self-pay

## 2017-06-06 ENCOUNTER — Ambulatory Visit
Admission: EM | Admit: 2017-06-06 | Discharge: 2017-06-06 | Disposition: A | Discharge status: Home / Self Care | Payer: BLUE CROSS/BLUE SHIELD | Attending: Family Medicine | Admitting: Family Medicine

## 2017-06-06 DIAGNOSIS — S39011A Strain of muscle, fascia and tendon of abdomen, initial encounter: Secondary | ICD-10-CM | POA: Diagnosis not present

## 2017-06-06 NOTE — Discharge Instructions (Signed)
Rest, ice, over the counter ibuprofen or aleve

## 2017-06-06 NOTE — ED Provider Notes (Signed)
[facility]    CSN: [id number] Arrival date & time: 06/06/17  1317     History   Chief Complaint Chief Complaint  Patient presents with  . Abdominal Pain    HPI Debra Booksshley Richmond is a 27 y.o. female.   27 yo female with a c/o mid abdominal pain for about 1-2 weeks after doing some heavy lifting. Pain is worse with lifting or straining. Denies any direct traumatic injury, falls, fevers, chills, nausea/vomiting, diarrhea, constipation. States does heavy lifting at work but also recently moved to a new home and did heavy lifting while moving. Patient states she's worried that she may have a hernia.    The history is provided by the patient.  Abdominal Pain    Past Medical History:  Diagnosis Date  . Anxiety   . GERD (gastroesophageal reflux disease)   . PCOD (polycystic ovarian disease)   . Rotator cuff disorder    right shoulder    Patient Active Problem List   Diagnosis Date Noted  . Cutaneous abscess of left upper extremity 01/21/2016  . PCOS (polycystic ovarian syndrome) 01/14/2016  . Right shoulder strain 08/27/2015  . Right shoulder tendinitis 08/27/2015  . Gastro-esophageal reflux disease with esophagitis 08/14/2012    Past Surgical History:  Procedure Laterality Date  . CHOLECYSTECTOMY  10/19/2012  . WISDOM TOOTH EXTRACTION Bilateral 04/29/2017   upper    OB History    No data available       Home Medications    Prior to Admission medications   Medication Sig Start Date End Date Taking? Authorizing Provider  albuterol (PROVENTIL HFA;VENTOLIN HFA) 108 (90 Base) MCG/ACT inhaler Inhale 2 puffs into the lungs every 6 (six) hours as needed for wheezing or shortness of breath. 01/20/17   Enid DerryWagner, Brent, PA-C  clonazePAM (KLONOPIN) 0.5 MG tablet Take 1 tablet by mouth as needed. 04/16/16 04/04/17  [provider]    Family History Family History  Problem Relation Age of Onset  . Lupus Mother     Social History Social History    Tobacco Use  . Smoking status: Current Every Day Smoker    Packs/day: 1.00    Types: Cigarettes  . Smokeless tobacco: Never Used  Substance Use Topics  . Alcohol use: Yes    Comment: rarely  . Drug use: No     Allergies   Prednisone; Dilaudid [hydromorphone hcl]; Hydromorphone; Doxycycline; Garlic; Vancomycin; Clindamycin; and Sulfa antibiotics   Review of Systems Review of Systems  Gastrointestinal: Positive for abdominal pain.     Physical Exam Triage Vital Signs ED Triage Vitals  Enc Vitals Group     BP 06/06/17 1330 132/75     Pulse Rate 06/06/17 1330 83     Resp 06/06/17 1330 18     Temp 06/06/17 1330 97.9 F (36.6 C)     Temp Source 06/06/17 1330 Oral     SpO2 06/06/17 1330 100 %     Weight 06/06/17 1328 143 lb (64.9 kg)     Height 06/06/17 1328 5\' 4"  (1.626 m)     Head Circumference --      Peak Flow --      Pain Score 06/06/17 1328 8     Pain Loc --      Pain Edu? --      Excl. in GC? --    No data found.  Updated Vital Signs BP 132/75 (BP Location: Left Arm)   Pulse 83   Temp 97.9 F (36.6 C) (  Oral)   Resp 18   Ht 5\' 4"  (1.626 m)   Wt 143 lb (64.9 kg)   SpO2 100%   BMI 24.55 kg/m   Visual Acuity Right Eye Distance:   Left Eye Distance:   Bilateral Distance:    Right Eye Near:   Left Eye Near:    Bilateral Near:     Physical Exam  Constitutional: She appears well-developed and well-nourished. No distress.  Abdominal: Soft. Bowel sounds are normal. She exhibits no distension and no mass. There is tenderness (superficial, muscular). There is no rebound and no guarding. No hernia.  Skin: She is not diaphoretic.  Nursing note and vitals reviewed.    UC Treatments / Results  Labs (all labs ordered are listed, but only abnormal results are displayed) Labs Reviewed - No data to display  EKG  EKG Interpretation None       Radiology No results found.  Procedures Procedures (including critical care time)  Medications Ordered  in UC Medications - No data to display   Initial Impression / Assessment and Plan / UC Course  I have reviewed the triage vital signs and the nursing notes.  Pertinent labs & imaging results that were available during my care of the patient were reviewed by me and considered in my medical decision making (see chart for details).       Final Clinical Impressions(s) / UC Diagnoses   Final diagnoses:  Strain of abdominal wall, initial encounter    ED Discharge Orders    None     1. diagnosis reviewed with patient 2. Recommend supportive treatment with otc analgesics/anti-inflammatories, ice, rest, no heavy lifting 3. Follow-up prn if symptoms worsen or don't improve  Controlled Substance Prescriptions [place] Controlled Substance Registry consulted? Not Applicable   Payton Mccallum, MD 06/06/17 [phone]

## 2017-06-06 NOTE — ED Triage Notes (Signed)
Patient complains of upper abdominal pain that started around 1 week ago after heavy lifting. Patient states that she is concerned that this could be a hernia. Patient states that the pain worsened today after lifting heavy boxes. Patient states that she can feel a knot at the area as well.

## 2017-06-20 ENCOUNTER — Emergency Department: Payer: BLUE CROSS/BLUE SHIELD

## 2017-06-20 ENCOUNTER — Emergency Department
Admission: EM | Admit: 2017-06-20 | Discharge: 2017-06-20 | Disposition: A | Discharge status: Home / Self Care | Payer: BLUE CROSS/BLUE SHIELD | Attending: Emergency Medicine | Admitting: Emergency Medicine

## 2017-06-20 ENCOUNTER — Other Ambulatory Visit: Payer: Self-pay

## 2017-06-20 DIAGNOSIS — M7918 Myalgia, other site: Secondary | ICD-10-CM

## 2017-06-20 DIAGNOSIS — M25511 Pain in right shoulder: Secondary | ICD-10-CM | POA: Insufficient documentation

## 2017-06-20 DIAGNOSIS — F17210 Nicotine dependence, cigarettes, uncomplicated: Secondary | ICD-10-CM | POA: Diagnosis not present

## 2017-06-20 DIAGNOSIS — E876 Hypokalemia: Secondary | ICD-10-CM | POA: Diagnosis not present

## 2017-06-20 DIAGNOSIS — R1011 Right upper quadrant pain: Secondary | ICD-10-CM

## 2017-06-20 DIAGNOSIS — K2900 Acute gastritis without bleeding: Secondary | ICD-10-CM

## 2017-06-20 LAB — URINALYSIS, COMPLETE (UACMP) WITH MICROSCOPIC
Bilirubin Urine: NEGATIVE
Glucose, UA: NEGATIVE mg/dL
HGB URINE DIPSTICK: NEGATIVE
Ketones, ur: NEGATIVE mg/dL
Leukocytes, UA: NEGATIVE
NITRITE: NEGATIVE
Protein, ur: NEGATIVE mg/dL
SPECIFIC GRAVITY, URINE: 1.004 — AB (ref 1.005–1.030)
pH: 6 (ref 5.0–8.0)

## 2017-06-20 LAB — CBC
HEMATOCRIT: 44.9 % (ref 35.0–47.0)
Hemoglobin: 15.4 g/dL (ref 12.0–16.0)
MCH: 31.9 pg (ref 26.0–34.0)
MCHC: 34.3 g/dL (ref 32.0–36.0)
MCV: 93 fL (ref 80.0–100.0)
Platelets: 255 10*3/uL (ref 150–440)
RBC: 4.83 MIL/uL (ref 3.80–5.20)
RDW: 12.4 % (ref 11.5–14.5)
WBC: 7 10*3/uL (ref 3.6–11.0)

## 2017-06-20 LAB — COMPREHENSIVE METABOLIC PANEL
ALBUMIN: 4.8 g/dL (ref 3.5–5.0)
ALT: 33 U/L (ref 14–54)
AST: 25 U/L (ref 15–41)
Alkaline Phosphatase: 86 U/L (ref 38–126)
Anion gap: 11 (ref 5–15)
BILIRUBIN TOTAL: 1 mg/dL (ref 0.3–1.2)
BUN: 17 mg/dL (ref 6–20)
CO2: 23 mmol/L (ref 22–32)
CREATININE: 0.68 mg/dL (ref 0.44–1.00)
Calcium: 9.8 mg/dL (ref 8.9–10.3)
Chloride: 103 mmol/L (ref 101–111)
GFR calc Af Amer: >60 mL/min (ref >60)
GFR calc non Af Amer: >60 mL/min (ref >60)
GLUCOSE: 89 mg/dL (ref 65–99)
POTASSIUM: 3.2 mmol/L — AB (ref 3.5–5.1)
Sodium: 137 mmol/L (ref 135–145)
TOTAL PROTEIN: 8 g/dL (ref 6.5–8.1)

## 2017-06-20 LAB — POCT PREGNANCY, URINE: PREG TEST UR: NEGATIVE

## 2017-06-20 LAB — TROPONIN I

## 2017-06-20 LAB — LIPASE, BLOOD: Lipase: 33 U/L (ref 11–51)

## 2017-06-20 MED ORDER — POTASSIUM CHLORIDE CRYS ER 20 MEQ PO TBCR
40.0000 meq | EXTENDED_RELEASE_TABLET | Freq: Once | ORAL | Status: AC
  Administered 2017-06-20: 40 meq via ORAL
  Filled 2017-06-20: qty 2

## 2017-06-20 MED ORDER — IOPAMIDOL (ISOVUE-370) INJECTION 76%
75.0000 mL | Freq: Once | INTRAVENOUS | Status: AC | PRN
  Administered 2017-06-20: 75 mL via INTRAVENOUS
  Filled 2017-06-20: qty 75

## 2017-06-20 MED ORDER — ALUM & MAG HYDROXIDE-SIMETH 200-200-20 MG/5ML PO SUSP
30.0000 mL | Freq: Once | ORAL | Status: AC
  Administered 2017-06-20: 30 mL via ORAL
  Filled 2017-06-20: qty 30

## 2017-06-20 MED ORDER — CYCLOBENZAPRINE HCL 10 MG PO TABS: 10.0000 mg | ORAL_TABLET | Freq: Three times a day (TID) | ORAL | 0 refills | Status: DC | PRN

## 2017-06-20 NOTE — ED Triage Notes (Signed)
Pt c/o RUQ pain that radiates into the back with N/V since yesterday morning, pt states "I think I have gall stones" states she had her gall bladder removed 4 years ago.

## 2017-06-20 NOTE — ED Provider Notes (Signed)
[facility] Emergency Department Provider Note ____________________________________________   I have reviewed the triage vital signs and the triage nursing note.  HISTORY  Chief Complaint Abdominal Pain   Historian Patient  HPI Debra Richmond is a 27 y.o. female presenting for midepigastric right upper quadrant pain for the past couple days that is somewhat intermittent, not necessarily associated with anything in particular and at times goes into her right shoulder blade.  Feels like when she had previous issues with her gallbladder, she has had her gallbladder taken out.  No fevers, although she stated that she felt like she had chills today.  Denies urinary frequency, hesitancy, or dysuria or hematuria.  Mild shortness of breath without cough.  She feels like the pain in her shoulder right is a little worse when she takes a deep breath.  No lower abdominal tenderness.    Family history of blood clots in her grandma x2 She does not take hormonal birth control.   Past Medical History:  Diagnosis Date  . Anxiety   . GERD (gastroesophageal reflux disease)   . PCOD (polycystic ovarian disease)   . Rotator cuff disorder    right shoulder    Patient Active Problem List   Diagnosis Date Noted  . Cutaneous abscess of left upper extremity 01/21/2016  . PCOS (polycystic ovarian syndrome) 01/14/2016  . Right shoulder strain 08/27/2015  . Right shoulder tendinitis 08/27/2015  . Gastro-esophageal reflux disease with esophagitis 08/14/2012    Past Surgical History:  Procedure Laterality Date  . CHOLECYSTECTOMY  10/19/2012  . WISDOM TOOTH EXTRACTION Bilateral 04/29/2017   upper    Prior to Admission medications   Medication Sig Start Date End Date Taking? Authorizing Provider  albuterol (PROVENTIL HFA;VENTOLIN HFA) 108 (90 Base) MCG/ACT inhaler Inhale 2 puffs into the lungs every 6 (six) hours as needed for wheezing or shortness of breath. 01/20/17    Enid Derry, PA-C  clonazePAM (KLONOPIN) 0.5 MG tablet Take 1 tablet by mouth as needed. 04/16/16 04/04/17  [provider]    Allergies  Allergen Reactions  . Prednisone Other (See Comments)    Disorientation  . Dilaudid [Hydromorphone Hcl] Hives  . Hydromorphone Rash  . Doxycycline Nausea Only and Nausea And Vomiting  . Garlic Nausea Only  . Vancomycin     Rash  . Clindamycin Diarrhea    Chest tighteness  . Sulfa Antibiotics Nausea And Vomiting    Family History  Problem Relation Age of Onset  . Lupus Mother     Social History Social History   Tobacco Use  . Smoking status: Current Every Day Smoker    Packs/day: 1.00    Types: Cigarettes  . Smokeless tobacco: Never Used  Substance Use Topics  . Alcohol use: Yes    Comment: rarely  . Drug use: No    Review of Systems  Constitutional: Negative for fever.  Positive for chills. Eyes: Negative for visual changes. ENT: Negative for sore throat. Cardiovascular: Not exactly chest pain, but pain in the right shoulder rated seems to be worse when she takes deep breaths. Respiratory: Positive for mild shortness of breath, without coughing or wheezing. Gastrointestinal: Vomiting x1, mild nausea.  No diarrhea.  Right upper quadrant and epigastric discomfort as per HPI. Genitourinary: Negative for dysuria. Musculoskeletal: Right shoulder blade pain as per HPI. Skin: Negative for rash. Neurological: Negative for headache.  ____________________________________________   PHYSICAL EXAM:  VITAL SIGNS: ED Triage Vitals  Enc Vitals Group     BP 06/20/17  1150 128/73     Pulse Rate 06/20/17 1150 76     Resp 06/20/17 1150 18     Temp 06/20/17 1150 98.7 F (37.1 C)     Temp Source 06/20/17 1150 Oral     SpO2 06/20/17 1150 100 %     Weight 06/20/17 1205 140 lb (63.5 kg)     Height 06/20/17 1205 5\' 4"  (1.626 m)     Head Circumference --      Peak Flow --      Pain Score 06/20/17 1205 5     Pain Loc --       Pain Edu? --      Excl. in GC? --      Constitutional: Alert and oriented. Well appearing and in no distress. HEENT   Head: Normocephalic and atraumatic.      Eyes: Conjunctivae are normal. Pupils equal and round.       Ears:         Nose: No congestion/rhinnorhea.   Mouth/Throat: Mucous membranes are moist.   Neck: No stridor. Cardiovascular/Chest: Normal rate, regular rhythm.  No murmurs, rubs, or gallops. Respiratory: Normal respiratory effort without tachypnea nor retractions. Breath sounds are clear and equal bilaterally. No wheezes/rales/rhonchi. Gastrointestinal: Soft. No distention, no guarding, no rebound.  Mild epigastric tenderness slightly to the right upper quadrant. Genitourinary/rectal:Deferred Musculoskeletal: Probable muscle spasm at the medial edge of the right shoulder blade.  Nontender with normal range of motion in all extremities. No joint effusions.  No lower extremity tenderness.  No edema. Neurologic:  Normal speech and language. No gross or focal neurologic deficits are appreciated. Skin:  Skin is warm, dry and intact. No rash noted. Psychiatric: Mood and affect are normal. Speech and behavior are normal. Patient exhibits appropriate insight and judgment.   ____________________________________________  LABS (pertinent positives/negatives) I, Governor Rooks, MD the attending physician have reviewed the labs noted below.  Labs Reviewed  COMPREHENSIVE METABOLIC PANEL - Abnormal; Notable for the following components:      Result Value   Potassium 3.2 (*)    All other components within normal limits  URINALYSIS, COMPLETE (UACMP) WITH MICROSCOPIC - Abnormal; Notable for the following components:   Color, Urine STRAW (*)    APPearance CLEAR (*)    Specific Gravity, Urine 1.004 (*)    Bacteria, UA RARE (*)    Squamous Epithelial / LPF 0-5 (*)    All other components within normal limits  URINE CULTURE  LIPASE, BLOOD  CBC  TROPONIN I  POC URINE  PREG, ED  POCT PREGNANCY, URINE    ____________________________________________    EKG I, Governor Rooks, MD, the attending physician have personally viewed and interpreted all ECGs.  71 bpm.  Normal sinus rhythm.  Narrow transfer normal axis.  Normal ST and T wave ____________________________________________  RADIOLOGY All Xrays were viewed by me.  Imaging interpreted by Radiologist, and I, Governor Rooks, MD the attending physician have reviewed the radiologist interpretation noted below.  Chest x-ray two-view:  IMPRESSION: No active cardiopulmonary disease.  Right upper quadrant ultrasound:  IMPRESSION: Status post cholecystectomy. No choledocholithiasis nor sonographic findings for the patient's right upper quadrant pain.  CT chest for PE:  IMPRESSION: 1. Normal CTA of the chest. No evidence of pulmonary embolism. __________________________________________  PROCEDURES  Procedure(s) performed: None  Critical Care performed: None   ____________________________________________  ED COURSE / ASSESSMENT AND PLAN  Pertinent labs & imaging results that were available during my care of the patient were reviewed  by me and considered in my medical decision making (see chart for details).  Patient is overall well-appearing, but she has been having several days of return of symptoms similar to prior biliary colic.  We will check chest x-ray to make sure this is not a low-lying pneumonia.  We discussed the possibility of PE given family history, and the fact that it is somewhat worse with breathing.    No lower abdominal tenderness.  We discussed that her laboratory studies are reassuring overall.  Right upper quadrant ultrasound is reassuring.  Chest x-ray is reassuring.  EKG is reassuring.  We discussed obtaining the PE study and chose to proceed.  PE study given reassuring.  Discussed with patient.  She is okay for discharge home.  DIFFERENTIAL DIAGNOSIS: Including but not  limited to pneumonia, PE, muscle spasm, gastritis, stomach ulcer, choledocholithiasis, kidney stone, urinary tract infection, etc.  CONSULTATIONS: None  Patient / Family / Caregiver informed of clinical course, medical decision-making process, and agree with plan.   I discussed return precautions, follow-up instructions, and discharge instructions with patient and/or family.  Discharge Instructions : You were evaluated for upper abdominal pain and pain into the right shoulderblade area with mild shortness of breath, and although no certain cause was found, your exam and evaluation are reassuring in the Emergency Department today.  As we discussed, you may be having acid reflux/gastritis and I recommend trying over the counter Maalox or Tums use as directed.  Try over-the-counter Prilosec 40 mg daily for 7-10 days.  If you have continued symptoms, you may need to be referred to gastroenterologist for further investigation.  As we discussed, the right shoulder blade pain seems like it may be due to muscle spasm.  Use heat and stretching and consider referral to physical therapy and/or massage to help with symptoms there.  You can try also muscle relaxer, as prescribed.  Return to the emergency department immediately for any worsening condition including uncontrolled pain, skin rash, fever, trouble breathing, abdominal pain, vomiting blood, black or bloody stool, or any other symptoms concerning to you.    ___________________________________________   FINAL CLINICAL IMPRESSION(S) / ED DIAGNOSES   Final diagnoses:  Hypokalemia  RUQ pain  Musculoskeletal pain  Acute gastritis, presence of bleeding unspecified, unspecified gastritis type      ___________________________________________        Note: This dictation was prepared with Dragon dictation. Any transcriptional errors that result from this process are unintentional    Governor RooksLord, Elliot Meldrum, MD 06/20/17 [phone]

## 2017-06-20 NOTE — Discharge Instructions (Signed)
You were evaluated for upper abdominal pain and pain into the right shoulderblade area with mild shortness of breath, and although no certain cause was found, your exam and evaluation are reassuring in the Emergency Department today.  As we discussed, you may be having acid reflux/gastritis and I recommend trying over the counter Maalox or Tums use as directed.  Try over-the-counter Prilosec 40 mg daily for 7-10 days.  If you have continued symptoms, you may need to be referred to gastroenterologist for further investigation.  As we discussed, the right shoulder blade pain seems like it may be due to muscle spasm.  Use heat and stretching and consider referral to physical therapy and/or massage to help with symptoms there.  You can try also muscle relaxer, as prescribed.  Return to the emergency department immediately for any worsening condition including uncontrolled pain, skin rash, fever, trouble breathing, abdominal pain, vomiting blood, black or bloody stool, or any other symptoms concerning to you.

## 2017-06-20 NOTE — ED Notes (Signed)
Pt ambulatory upon discharge. Verbalized understanding of discharge instructions, follow-up care and prescription. VSS. Skin warm and dry. A&O x4.  

## 2017-06-20 NOTE — ED Notes (Signed)
Patient transported to X-ray 

## 2017-06-21 LAB — URINE CULTURE

## 2017-07-04 ENCOUNTER — Other Ambulatory Visit: Payer: Self-pay

## 2017-07-04 ENCOUNTER — Encounter: Payer: Self-pay | Admitting: *Deleted

## 2017-07-04 ENCOUNTER — Ambulatory Visit
Admission: EM | Admit: 2017-07-04 | Discharge: 2017-07-04 | Disposition: A | Discharge status: Home / Self Care | Payer: BLUE CROSS/BLUE SHIELD | Attending: Registered Nurse | Admitting: Registered Nurse

## 2017-07-04 DIAGNOSIS — L03112 Cellulitis of left axilla: Secondary | ICD-10-CM | POA: Diagnosis not present

## 2017-07-04 MED ORDER — ACETAMINOPHEN 500 MG PO TABS: 1000.0000 mg | ORAL_TABLET | Freq: Four times a day (QID) | ORAL | 0 refills | Status: DC | PRN

## 2017-07-04 MED ORDER — CEPHALEXIN 500 MG PO CAPS: 500.0000 mg | ORAL_CAPSULE | Freq: Two times a day (BID) | ORAL | 0 refills | Status: AC

## 2017-07-04 NOTE — ED Provider Notes (Signed)
[facility]    CSN: [id number] Arrival date & time: 07/04/17  1305     History   Chief Complaint Chief Complaint  Patient presents with  . Abscess    HPI Debra Richmond is a 27 y.o. female.   26y/o caucasian female established patient here for abscess left armpit.  I have tried warm compresses and squeezing it but it won't go away.  I have needed surgery and antibiotics in the past.  Reviewed Epic and patient last seen by Dr Judd Gaudier Mar 2018 given doxycycline 100mg  po BID.  Patient also seen by general surgery and told to continue antibiotics for right shoulder abscess did not require I&D.  Noted in allergies nausea and vomiting with sulfa and doxycycline today.  Patient needs work note for today.  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      Past Medical History:  Diagnosis Date  . Anxiety   . GERD (gastroesophageal reflux disease)   . PCOD (polycystic ovarian disease)   . Rotator cuff disorder    right shoulder    Patient Active Problem List   Diagnosis Date Noted  . Cutaneous abscess of left upper extremity 01/21/2016  . PCOS (polycystic ovarian syndrome) 01/14/2016  . Right shoulder strain 08/27/2015  . Right shoulder tendinitis 08/27/2015  . Gastro-esophageal reflux disease with esophagitis 08/14/2012    Past Surgical History:  Procedure Laterality Date  . CHOLECYSTECTOMY  10/19/2012  . WISDOM TOOTH EXTRACTION Bilateral 04/29/2017   upper    OB History    No data available       Home Medications    Prior to Admission medications   Medication Sig Start Date End Date Taking? Authorizing Provider  acetaminophen (TYLENOL) 500 MG tablet Take 2 tablets (1,000 mg total) by mouth every 6 (six) hours as needed. 07/04/17   Malaiyah Achorn, Jarold Song, NP  cephALEXin (KEFLEX) 500 MG capsule Take 1 capsule (500 mg total) by mouth 2 (two) times daily for 14 days. 07/04/17 07/18/17  Imelda Dandridge, Jarold Song, NP  clonazePAM (KLONOPIN) 0.5 MG tablet Take 1 tablet by mouth 2 (two) times daily  as needed.  04/16/16 04/04/17  [provider]    Family History Family History  Problem Relation Age of Onset  . Lupus Mother     Social History Social History   Tobacco Use  . Smoking status: Current Every Day Smoker    Packs/day: 1.00    Types: Cigarettes  . Smokeless tobacco: Never Used  Substance Use Topics  . Alcohol use: Yes    Comment: rarely  . Drug use: No     Allergies   Prednisone; Dilaudid [hydromorphone hcl]; Hydromorphone; Doxycycline; Garlic; Vancomycin; Clindamycin; and Sulfa antibiotics   Review of Systems Review of Systems  Constitutional: Negative for activity change, appetite change, chills, diaphoresis, fatigue and fever.  HENT: Negative for trouble swallowing and voice change.   Eyes: Negative for photophobia and visual disturbance.  Respiratory: Negative for cough, choking, chest tightness, shortness of breath, wheezing and stridor.   Cardiovascular: Negative for chest pain.  Gastrointestinal: Negative for abdominal distention, abdominal pain, blood in stool, diarrhea, nausea and vomiting.  Endocrine: Negative for cold intolerance and heat intolerance.  Genitourinary: Negative for difficulty urinating.  Musculoskeletal: Negative for arthralgias, back pain, gait problem, joint swelling, myalgias, neck pain and neck stiffness.  Skin: Positive for color change and rash. Negative for pallor and wound.  Allergic/Immunologic: Negative for environmental allergies and food allergies.  Neurological: Negative for tremors and weakness.  Hematological: Negative  for adenopathy. Does not bruise/bleed easily.  Psychiatric/Behavioral: Negative for agitation, confusion and sleep disturbance.     Physical Exam Triage Vital Signs ED Triage Vitals  Enc Vitals Group     BP 07/04/17 1343 118/68     Pulse Rate 07/04/17 1343 69     Resp 07/04/17 1343 16     Temp 07/04/17 1343 98.2 F (36.8 C)     Temp Source 07/04/17 1343 Oral     SpO2 07/04/17 1343  100 %     Weight --      Height --      Head Circumference --      Peak Flow --      Pain Score 07/04/17 1344 0     Pain Loc --      Pain Edu? --      Excl. in GC? --    No data found.  Updated Vital Signs BP 118/68 (BP Location: Left Arm)   Pulse 69   Temp 98.2 F (36.8 C) (Oral)   Resp 16   LMP 04/03/2017   SpO2 100%   Visual Acuity Right Eye Distance:   Left Eye Distance:   Bilateral Distance:    Right Eye Near:   Left Eye Near:    Bilateral Near:     Physical Exam  Constitutional: She is oriented to person, place, and time. Vital signs are normal. She appears well-developed and well-nourished. She is active and cooperative.  Non-toxic appearance. She does not have a sickly appearance. She does not appear ill. No distress.  HENT:  Head: Normocephalic and atraumatic.  Right Ear: Hearing and external ear normal.  Left Ear: Hearing and external ear normal.  Nose: Nose normal.  Mouth/Throat: Uvula is midline, oropharynx is clear and moist and mucous membranes are normal. No oropharyngeal exudate.  Eyes: Conjunctivae, EOM and lids are normal. Pupils are equal, round, and reactive to light. Right eye exhibits no discharge. Left eye exhibits no discharge. No scleral icterus.  Neck: Trachea normal, normal range of motion and phonation normal. Neck supple. No tracheal tenderness and no muscular tenderness present. No neck rigidity. No tracheal deviation, no edema, no erythema and normal range of motion present. No thyromegaly present.  Cardiovascular: Normal rate, regular rhythm, normal heart sounds and intact distal pulses.  Pulses:      Radial pulses are 2+ on the right side, and 2+ on the left side.  Pulmonary/Chest: Effort normal and breath sounds normal. No stridor. No respiratory distress. She has no decreased breath sounds. She has no wheezes. She has no rhonchi. She has no rales. She exhibits no tenderness.  Abdominal: Soft. She exhibits no distension. There is no  guarding.  Musculoskeletal: Normal range of motion. She exhibits tenderness. She exhibits no edema or deformity.       Right shoulder: Normal.       Left shoulder: Normal.       Right elbow: Normal.      Left elbow: Normal.       Right hip: Normal.       Left hip: Normal.       Right knee: Normal.       Left knee: Normal.       Cervical back: Normal.       Thoracic back: Normal.       Lumbar back: Normal.       Right upper arm: Normal.       Left upper arm: Normal.  Right hand: Normal.       Left hand: Normal.  Lymphadenopathy:       Head (right side): No submental, no submandibular, no tonsillar, no preauricular, no posterior auricular and no occipital adenopathy present.       Head (left side): No submental, no submandibular, no tonsillar, no preauricular, no posterior auricular and no occipital adenopathy present.    She has no cervical adenopathy.       Right cervical: No superficial cervical, no deep cervical and no posterior cervical adenopathy present.      Left cervical: No superficial cervical, no deep cervical and no posterior cervical adenopathy present.       Left axillary: No lateral adenopathy present. Neurological: She is alert and oriented to person, place, and time. She has normal strength. She is not disoriented. She displays no atrophy and no tremor. No cranial nerve deficit or sensory deficit. She exhibits normal muscle tone. She displays no seizure activity. Coordination and gait normal. GCS eye subscore is 4. GCS verbal subscore is 5. GCS motor subscore is 6.  On/off exam table without difficulty; gait sure and steady in hallway  Skin: Skin is warm, dry and intact. Capillary refill takes less than 2 seconds. Rash noted. No abrasion, no bruising, no burn, no ecchymosis, no laceration, no lesion, no petechiae and no purpura noted. Rash is macular, papular and maculopapular. Rash is not nodular, not pustular, not vesicular and not urticarial. She is not diaphoretic.  There is erythema. No cyanosis. No pallor. Nails show no clubbing.     1cm macular papular diameter erythematous lesion left axilla mid axillary line tender to palpation no fluctuance dry and slight scaling centrally dry skin  Psychiatric: She has a normal mood and affect. Her speech is normal and behavior is normal. Judgment and thought content normal. Cognition and memory are normal.  Nursing note and vitals reviewed.    UC Treatments / Results  Labs (all labs ordered are listed, but only abnormal results are displayed) Labs Reviewed - No data to display  EKG  EKG Interpretation None       Radiology No results found.  Procedures Procedures (including critical care time)  Medications Ordered in UC Medications - No data to display   Initial Impression / Assessment and Plan / UC Course  I have reviewed the triage vital signs and the nursing notes.  Pertinent labs & imaging results that were available during my care of the patient were reviewed by me and considered in my medical decision making (see chart for details).      Final Clinical Impressions(s) / UC Diagnoses   Final diagnoses:  Cellulitis of left axilla    ED Discharge Orders        Ordered    cephALEXin (KEFLEX) 500 MG capsule  2 times daily     07/04/17 1401    acetaminophen (TYLENOL) 500 MG tablet  Every 6 hours PRN     07/04/17 1401       Controlled Substance Prescriptions [place] Controlled Substance Registry consulted?no  P-keflex 500mg  po BID x 7-14 days finish all 14 days if has not cleared completely at 7 days #28 Rf0 due to nausea/vomiting with doxycycline/sulfa.  Work excuse x 24 hours.  May apply neosporin topical BID after washing with soap and water.  Do not squeeze/puncture/dig into area as can worsen infection.  Apply hot pack 15-20 minutes BID.  Exitcare handout on skin infection given to patient.  RTC if worsening erythema,  pain, purulent discharge, fever.  Wash towels, washcloths, sheets  in hot water with bleach every couple of days until infection resolved.  Patient verbalized understanding, agreed with plan of care and had no further questions at this time.     Barbaraann BarthelBetancourt, Johntay Doolen A, NP 07/04/17 1431

## 2017-07-04 NOTE — ED Triage Notes (Signed)
Patient started having symptom of cyst / abscess under her left arm 1 week ago. Patient does have a history of abscess.

## 2017-08-04 ENCOUNTER — Ambulatory Visit
Admission: EM | Admit: 2017-08-04 | Discharge: 2017-08-04 | Disposition: A | Discharge status: Home / Self Care | Payer: BLUE CROSS/BLUE SHIELD | Attending: Family Medicine | Admitting: Family Medicine

## 2017-08-04 ENCOUNTER — Other Ambulatory Visit: Payer: Self-pay

## 2017-08-04 DIAGNOSIS — J029 Acute pharyngitis, unspecified: Secondary | ICD-10-CM

## 2017-08-04 DIAGNOSIS — R509 Fever, unspecified: Secondary | ICD-10-CM | POA: Diagnosis not present

## 2017-08-04 LAB — RAPID STREP SCREEN (MED CTR MEBANE ONLY): STREPTOCOCCUS, GROUP A SCREEN (DIRECT): NEGATIVE

## 2017-08-04 NOTE — ED Triage Notes (Signed)
Patient complains of sore throat, body aches and fever that started on Monday pm, worsened Tuesday. Patient reports that she has thrown up twice since this started.

## 2017-08-04 NOTE — ED Provider Notes (Signed)
[facility]    CSN: [id number] Arrival date & time: 08/04/17  1024     History   Chief Complaint Chief Complaint  Patient presents with  . Sore Throat    APPT    HPI Debra Richmond is a 27 y.o. female.   HPI  27 year old female well-known to our clinic returns today stating that she has had sore throat body aches and fever started on Monday afternoon.  It became much worse on Tuesday and she states that her fever was at 102 at that point.  Currently she is afebrile, pulse rate of 80, O2 sats at 97% on room air.  States her throat is sore she has had no cough.  She did have vomiting this morning and  Diarrhea.       Past Medical History:  Diagnosis Date  . Anxiety   . GERD (gastroesophageal reflux disease)   . PCOD (polycystic ovarian disease)   . Rotator cuff disorder    right shoulder    Patient Active Problem List   Diagnosis Date Noted  . Cutaneous abscess of left upper extremity 01/21/2016  . PCOS (polycystic ovarian syndrome) 01/14/2016  . Right shoulder strain 08/27/2015  . Right shoulder tendinitis 08/27/2015  . Gastro-esophageal reflux disease with esophagitis 08/14/2012    Past Surgical History:  Procedure Laterality Date  . CHOLECYSTECTOMY  10/19/2012  . WISDOM TOOTH EXTRACTION Bilateral 04/29/2017   upper    OB History    No data available       Home Medications    Prior to Admission medications   Not on File    Family History Family History  Problem Relation Age of Onset  . Lupus Mother     Social History Social History   Tobacco Use  . Smoking status: Current Every Day Smoker    Packs/day: 1.00    Types: Cigarettes  . Smokeless tobacco: Never Used  Substance Use Topics  . Alcohol use: Yes    Comment: rarely  . Drug use: No     Allergies   Prednisone; Dilaudid [hydromorphone hcl]; Hydromorphone; Doxycycline; Garlic; Vancomycin; Clindamycin; and Sulfa antibiotics   Review of Systems Review of Systems    Constitutional: Positive for activity change. Negative for chills, fatigue and fever.  HENT: Positive for sore throat.   Respiratory: Negative for cough.   Gastrointestinal: Positive for diarrhea, nausea and vomiting.  All other systems reviewed and are negative.    Physical Exam Triage Vital Signs ED Triage Vitals  Enc Vitals Group     BP 08/04/17 1035 118/74     Pulse Rate 08/04/17 1035 80     Resp 08/04/17 1035 18     Temp 08/04/17 1035 97.9 F (36.6 C)     Temp Source 08/04/17 1035 Oral     SpO2 08/04/17 1035 97 %     Weight 08/04/17 1032 143 lb (64.9 kg)     Height 08/04/17 1032 5\' 4"  (1.626 m)     Head Circumference --      Peak Flow --      Pain Score 08/04/17 1032 7     Pain Loc --      Pain Edu? --      Excl. in GC? --    No data found.  Updated Vital Signs BP 118/74 (BP Location: Left Arm)   Pulse 80   Temp 97.9 F (36.6 C) (Oral)   Resp 18   Ht 5\' 4"  (1.626 m)   Wt  143 lb (64.9 kg)   SpO2 97%   BMI 24.55 kg/m   Visual Acuity Right Eye Distance:   Left Eye Distance:   Bilateral Distance:    Right Eye Near:   Left Eye Near:    Bilateral Near:     Physical Exam  Constitutional: She is oriented to person, place, and time. She appears well-developed and well-nourished.  Non-toxic appearance. She does not appear ill. No distress.  HENT:  Head: Normocephalic.  Right Ear: Hearing, tympanic membrane and ear canal normal.  Left Ear: Hearing, tympanic membrane and ear canal normal.  Mouth/Throat: Oropharynx is clear and moist and mucous membranes are normal. No oral lesions. No uvula swelling. No oropharyngeal exudate, posterior oropharyngeal edema, posterior oropharyngeal erythema or tonsillar abscesses. Tonsils are 1+ on the right. Tonsils are 1+ on the left. No tonsillar exudate.  Eyes: EOM are normal. Pupils are equal, round, and reactive to light.  Neck: Normal range of motion.  Pulmonary/Chest: Effort normal and breath sounds normal.  Abdominal:  Soft. Bowel sounds are normal. She exhibits no distension. There is no tenderness. There is no rebound and no guarding.  Lymphadenopathy:    She has no cervical adenopathy.  Neurological: She is alert and oriented to person, place, and time.  Skin: Skin is warm and dry.  Psychiatric: She has a normal mood and affect. Her behavior is normal.  Nursing note and vitals reviewed.    UC Treatments / Results  Labs (all labs ordered are listed, but only abnormal results are displayed) Labs Reviewed  RAPID STREP SCREEN (NOT AT [facility])  CULTURE, GROUP A STREP [facility])    EKG  EKG Interpretation None       Radiology No results found.  Procedures Procedures (including critical care time)  Medications Ordered in UC Medications - No data to display   Initial Impression / Assessment and Plan / UC Course  I have reviewed the triage vital signs and the nursing notes.  Pertinent labs & imaging results that were available during my care of the patient were reviewed by me and considered in my medical decision making (see chart for details).     Plan: 1. Test/x-ray results and diagnosis reviewed with patient 2. rx as per orders; risks, benefits, potential side effects reviewed with patient 3. Recommend supportive treatment with water gargles lozenges for comfort.  Motrin or Tylenol for sore throat pain.  Results of the swab will be available in 48 hours 4. F/u prn if symptoms worsen or don't improve   Final Clinical Impressions(s) / UC Diagnoses   Final diagnoses:  Sore throat    ED Discharge Orders    None       Controlled Substance Prescriptions [place] Controlled Substance Registry consulted? Not Applicable   Lutricia Feil, PA-C 08/04/17 1428

## 2017-08-07 LAB — CULTURE, GROUP A STREP (THRC)

## 2017-09-21 ENCOUNTER — Ambulatory Visit (INDEPENDENT_AMBULATORY_CARE_PROVIDER_SITE_OTHER): Payer: BLUE CROSS/BLUE SHIELD | Admitting: Surgery

## 2017-09-21 ENCOUNTER — Encounter: Payer: Self-pay | Admitting: Surgery

## 2017-09-21 DIAGNOSIS — L03111 Cellulitis of right axilla: Secondary | ICD-10-CM | POA: Diagnosis not present

## 2017-09-21 MED ORDER — CEPHALEXIN 500 MG PO CAPS: 500.0000 mg | ORAL_CAPSULE | Freq: Two times a day (BID) | ORAL | 0 refills | Status: AC

## 2017-09-21 NOTE — Progress Notes (Signed)
Surgical Clinic Progress/Follow-up Note   HPI:  27 y.o. Female well-known to our practice (previously for incision and drainage of Left forearm and buttock abscesses, as well as non-operative management of Right shoulder and Left axilla cellulitis) presents to clinic for evaluation of Right axillary pain associated with an increasingly erythematous and enlarging "swelling" / "boil". Patient reports she's been treating the lesion with warm compresses, over which time the area has become larger and more firm, but she has not yet been treated for this particular lesion with antibiotics or been evaluated otherwise. 3 months ago, she was treated successfully with antibiotics for Left axillary cellulitis by her PMD. Patient otherwise denies prior Right axillary symptoms and denies fever/chills, N/V, CP, or SOB.  Review of Systems:  Constitutional: denies any other weight loss, fever, chills, or sweats  Eyes: denies any other vision changes, history of eye injury  ENT: denies sore throat, hearing problems  Respiratory: denies shortness of breath, wheezing  Cardiovascular: denies chest pain, palpitations  Gastrointestinal: denies abdominal pain, N/V, or diarrhea Musculoskeletal: denies any other joint pains or cramps  Skin: Denies any other rashes or skin discolorations  Neurological: denies any other headache, dizziness, weakness  Psychiatric: denies any other depression, anxiety  All other review of systems: otherwise negative   Vital Signs:  There were no vitals taken for this visit.   Physical Exam:  Constitutional:  -- Normal body habitus  -- Awake, alert, and oriented x3  Eyes:  -- Pupils equally round and reactive to light  -- No scleral icterus  Ear, nose, throat:  -- No jugular venous distension  -- No nasal drainage, bleeding Pulmonary:  -- No crackles -- Equal breath sounds bilaterally -- Breathing non-labored at rest Cardiovascular:  -- S1, S2 present  -- No pericardial  rubs  Gastrointestinal:  -- Soft, nontender, non-distended, no guarding/rebound  -- No abdominal masses appreciated, pulsatile or otherwise  Musculoskeletal / Integumentary:  -- Wounds or skin discoloration: 1 cm focus of firm erythematous induration without fluctuance to suggest underlying abscess, though the firm lesion does extend an equal 1 cm deep, possibly consistent with an underlying cystic structure such as a sebaceous cyst -- Extremities: B/L UE and LE FROM, hands and feet warm, no edema  Neurologic:  -- Motor function: intact and symmetric  -- Sensation: intact and symmetric   Assessment:  27 y.o. yo Female with a problem list including...  Patient Active Problem List   Diagnosis Date Noted  . Cutaneous abscess of left upper extremity 01/21/2016  . PCOS (polycystic ovarian syndrome) 01/14/2016  . Right shoulder strain 08/27/2015  . Right shoulder tendinitis 08/27/2015  . Gastro-esophageal reflux disease with esophagitis 08/14/2012    presents to clinic for evaluation of Right axillary pain with nodule and erythema without fluctuance, most suggestive of an infected previously asymptomatic sebaceous cyst.  Plan:   - okay to continue warm compresses  - antibiotics prescribed (Keflex 500 mg BID)  - instructed to call office if any questions or concerns  - return to clinic for follow-up in 1 week  All of the above recommendations were discussed with the patient and patient's family, and all of patient's and family's questions were answered to their expressed satisfaction.  -- Scherrie GerlachJason E. Earlene Plateravis, MD, RPVI [place] - Partnering for exceptional care. Office: [phone]

## 2017-09-21 NOTE — Patient Instructions (Signed)
Epidermal Cyst An epidermal cyst is a small, painless lump under your skin. It may be called an epidermal inclusion cyst or an infundibular cyst. The cyst contains a grayish-white, bad-smelling substance (keratin). It is important not to pop epidermal cysts yourself. These cysts are usually harmless (benign), but they can get infected. Symptoms of infection may include:  Redness.  Inflammation.  Tenderness.  Warmth.  Fever.  A grayish-white, bad-smelling substance draining from the cyst.  Pus draining from the cyst.  Follow these instructions at home:  Take over-the-counter and prescription medicines only as told by your doctor.  If you were prescribed an antibiotic, use it as told by your doctor. Do not stop using the antibiotic even if you start to feel better.  Keep the area around your cyst clean and dry.  Wear loose, dry clothing.  Do not try to pop your cyst.  Avoid touching your cyst.  Check your cyst every day for signs of infection.  Keep all follow-up visits as told by your doctor. This is important. How is this prevented?  Wear clean, dry, clothing.  Avoid wearing tight clothing.  Keep your skin clean and dry. Shower or take baths every day.  Wash your body with a benzoyl peroxide wash when you shower or bathe. Contact a health care provider if:  Your cyst has symptoms of infection.  Your condition is not improving or is getting worse.  You have a cyst that looks different from other cysts you have had.  You have a fever. Get help right away if:  Redness spreads from the cyst into the surrounding area. This information is not intended to replace advice given to you by your health care provider. Make sure you discuss any questions you have with your health care provider. Document Released: 06/24/2004 Document Revised: 01/14/2016 Document Reviewed: 03/19/2015 Elsevier Interactive Patient Education  2018 Elsevier Inc.  

## 2017-09-23 ENCOUNTER — Other Ambulatory Visit: Payer: Self-pay

## 2017-09-27 ENCOUNTER — Ambulatory Visit (INDEPENDENT_AMBULATORY_CARE_PROVIDER_SITE_OTHER): Payer: BLUE CROSS/BLUE SHIELD | Admitting: Surgery

## 2017-09-27 ENCOUNTER — Other Ambulatory Visit: Payer: Self-pay | Admitting: Surgery

## 2017-09-27 ENCOUNTER — Telehealth: Payer: Self-pay

## 2017-09-27 ENCOUNTER — Other Ambulatory Visit: Payer: Self-pay

## 2017-09-27 ENCOUNTER — Encounter: Payer: Self-pay | Admitting: Surgery

## 2017-09-27 VITALS — BP 147/84 | HR 76 | Temp 98.1°F | Ht 64.0 in | Wt 147.0 lb

## 2017-09-27 DIAGNOSIS — L02419 Cutaneous abscess of limb, unspecified: Secondary | ICD-10-CM | POA: Diagnosis not present

## 2017-09-27 MED ORDER — AMOXICILLIN-POT CLAVULANATE 875-125 MG PO TABS: 1.0000 | ORAL_TABLET | Freq: Two times a day (BID) | ORAL | 0 refills | Status: AC

## 2017-09-27 NOTE — Patient Instructions (Signed)
Please pick up your medicine at the pharmacy and begin taking it today.  Please remove old packing and wash the area with soap and water and place new packing into the wound and cover with a dressing.  Please see your follow up appointment listed below.

## 2017-09-27 NOTE — Telephone Encounter (Signed)
Spoke with @ [facility] for specimen pickup. Specimen packaged for transport and placed in white drop box and hung over door.

## 2017-09-27 NOTE — Progress Notes (Signed)
Outpatient Surgical Follow Up  09/27/2017  Debra Richmond is an 27 y.o. female.   Chief Complaint  Patient presents with  . 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  She was most recently seen by my partner Dr. Earlene Plater for a right arm cyst that was apparently infected.  She was prescribed antibiotics but unfortunately her symptoms have not improved and actually worsened.  She reports that there is more painful within the nodule and there is more erythema.  No fevers or chills.  Keflex do not seem to be working.  She is not septic and does not have any constitutional symptoms.  No evidence of compartment symptoms  Past Medical History:  Diagnosis Date  . Anxiety   . GERD (gastroesophageal reflux disease)   . PCOD (polycystic ovarian disease)   . Rotator cuff disorder    right shoulder    Past Surgical History:  Procedure Laterality Date  . CHOLECYSTECTOMY  10/19/2012  . WISDOM TOOTH EXTRACTION Bilateral 04/29/2017   upper    Family History  Problem Relation Age of Onset  . Lupus Mother     Social History:  reports that she has been smoking cigarettes.  She has been smoking about 1.00 pack per day. She has never used smokeless tobacco. She reports that she drinks alcohol. She reports that she does not use drugs.  Allergies:  Allergies  Allergen Reactions  . Prednisone Other (See Comments)    Disorientation  . Dilaudid [Hydromorphone Hcl] Hives  . Hydromorphone Rash  . Doxycycline Nausea Only and Nausea And Vomiting  . Garlic Nausea Only  . Vancomycin     Rash  . Clindamycin Diarrhea    Chest tighteness  . Sulfa Antibiotics Nausea And Vomiting and Nausea Only    Medications reviewed.    ROS Full ROS performed and is otherwise negative other than what is stated in HPI   BP (!) 147/84   Pulse 76   Temp 98.1 F (36.7 C) (Oral)   Ht  (1.626 m)   Wt 66.7 kg  (147 lb)   BMI 25.23 kg/m   Physical Exam  Constitutional: She is oriented to person, place, and time. She appears well-developed and well-nourished.  Neck: Normal range of motion. Neck supple. No JVD present. No tracheal deviation present.  Pulmonary/Chest: Effort normal. No respiratory distress.  Abdominal: Soft. She exhibits no distension and no mass. There is no tenderness. There is no guarding.  Musculoskeletal: Normal range of motion.  Neurological: She is alert and oriented to person, place, and time.  Skin: Skin is warm. Capillary refill takes less than 2 seconds.  There is a 3 cm fluctuant abscess on the right upper arm exquisitely tender to palpation with blanching erythema consistent with abscess  Psychiatric: She has a normal mood and affect. Her behavior is normal. Judgment and thought content normal.  Nursing note and vitals reviewed.   Assessment/Plan: Right upper arm abscess in need for I&D.  Discussed with the patient detail about the need for procedure.  Risk benefit and possible complications including but not limited to: Bleeding, infection, recurrence she understands and wishes to proceed. We will also prescribe augmentin for 10 days   Procedure Note  Procedures: 1. I/D complex right arm abscess 2. Excisional debridement of skin and subq tissue 6cm2  Anesthesia: Lidocaine 1% w epi  EBL: minimal  Findings: large abscess right  arm  Complications: none  After Informed consent was obtained patient was prepped and draped in the usual sterile fashion.  Local anesthesia was infiltrated in the area of interest.  Using an 11 blade we perform active elliptical incision and drain about 8 cc of pus.  Culture was obtained.  Using Metzenbaum scissors we debrided some skin answer cutaneous tissue in the standard fashion.  Hemostasis was obtained with pressure.  The wound was irrigated with saline and quarter inch packing was placed.  No complications    Sterling Big, MD  [facility] General Surgeon

## 2017-09-28 ENCOUNTER — Telehealth: Payer: Self-pay

## 2017-09-28 ENCOUNTER — Ambulatory Visit: Payer: Self-pay | Admitting: Surgery

## 2017-09-29 ENCOUNTER — Telehealth: Payer: Self-pay

## 2017-09-29 LAB — SPECIMEN STATUS REPORT

## 2017-09-29 NOTE — Addendum Note (Signed)
Addended by: Cameron Proud on: 09/29/2017 02:31 PM   Modules accepted: Orders

## 2017-09-29 NOTE — Telephone Encounter (Signed)
Call placed to [facility] at this time to check status of wound culture. Right axilla abscess.  Spoke with [facility]- test has been added.

## 2017-09-29 NOTE — Telephone Encounter (Signed)
Note for work ready for pick up.

## 2017-09-30 ENCOUNTER — Telehealth: Payer: Self-pay | Admitting: Surgery

## 2017-09-30 NOTE — Telephone Encounter (Signed)
Patients calling said she is having thick white pus coming out of abscess, patient said she has multiple galls on and still draining through also having more pain then healing. Pain level being a four, but at nights it get up to about an eight. Please call patient and advise.

## 2017-09-30 NOTE — Telephone Encounter (Signed)
Patient states the drainage is thick pus. Painful especially at night.  She denies fever, but is having chills. She is taking the Augmentin but she hasn't noticed a difference.  Patient was sent to Emergency room to be evaluated due to not having surgeon in office.

## 2017-10-02 ENCOUNTER — Other Ambulatory Visit: Payer: Self-pay

## 2017-10-02 ENCOUNTER — Encounter: Payer: Self-pay | Admitting: Emergency Medicine

## 2017-10-02 ENCOUNTER — Ambulatory Visit
Admission: EM | Admit: 2017-10-02 | Discharge: 2017-10-02 | Disposition: A | Discharge status: Home / Self Care | Payer: BLUE CROSS/BLUE SHIELD | Attending: Family Medicine | Admitting: Family Medicine

## 2017-10-02 DIAGNOSIS — Z5189 Encounter for other specified aftercare: Secondary | ICD-10-CM | POA: Diagnosis not present

## 2017-10-02 NOTE — ED Provider Notes (Signed)
[facility]    CSN: [id number] Arrival date & time: 10/02/17  1019     History   Chief Complaint Chief Complaint  Patient presents with  . Wound Check    HPI Debra Richmond is a 27 y.o. female.   27 yo female here for wound recheck. Had I&D last week for axillary abscess. Currently on augmentin. Denies any fevers or chills.   The history is provided by the patient.  Wound Check     Past Medical History:  Diagnosis Date  . Anxiety   . GERD (gastroesophageal reflux disease)   . PCOD (polycystic ovarian disease)   . Rotator cuff disorder    right shoulder    Patient Active Problem List   Diagnosis Date Noted  . Cutaneous abscess of left upper extremity 01/21/2016  . PCOS (polycystic ovarian syndrome) 01/14/2016  . Right shoulder strain 08/27/2015  . Right shoulder tendinitis 08/27/2015  . Gastro-esophageal reflux disease with esophagitis 08/14/2012    Past Surgical History:  Procedure Laterality Date  . CHOLECYSTECTOMY  10/19/2012  . WISDOM TOOTH EXTRACTION Bilateral 04/29/2017   upper    OB History   None      Home Medications    Prior to Admission medications   Medication Sig Start Date End Date Taking? Authorizing Provider  amoxicillin-clavulanate (AUGMENTIN) 875-125 MG tablet Take 1 tablet by mouth 2 (two) times daily for 10 days. 09/27/17 10/07/17 Yes Pabon, Diego F, MD  clonazePAM (KLONOPIN) 0.5 MG tablet Take by mouth. 02/08/17  Yes [provider]  naproxen (EC NAPROSYN) 500 MG EC tablet Take by mouth. 06/28/15   [provider]    Family History Family History  Problem Relation Age of Onset  . Lupus Mother     Social History Social History   Tobacco Use  . Smoking status: Current Every Day Smoker    Packs/day: 1.00    Types: Cigarettes  . Smokeless tobacco: Never Used  Substance Use Topics  . Alcohol use: Yes    Comment: rarely  . Drug use: No     Allergies   Prednisone; Dilaudid [hydromorphone hcl];  Hydromorphone; Doxycycline; Garlic; Vancomycin; Clindamycin; and Sulfa antibiotics   Review of Systems Review of Systems   Physical Exam Triage Vital Signs ED Triage Vitals [10/02/17 1025]  Enc Vitals Group     BP 119/81     Pulse Rate 66     Resp 14     Temp 97.9 F (36.6 C)     Temp Source Oral     SpO2 99 %     Weight 148 lb (67.1 kg)     Height  (1.626 m)     Head Circumference      Peak Flow      Pain Score 4     Pain Loc      Pain Edu?      Excl. in GC?    No data found.  Updated Vital Signs BP 119/81 (BP Location: Left Arm)   Pulse 66   Temp 97.9 F (36.6 C) (Oral)   Resp 14   Ht  (1.626 m)   Wt 148 lb (67.1 kg)   LMP 09/18/2017 (Approximate)   SpO2 99%   BMI 25.40 kg/m   Visual Acuity Right Eye Distance:   Left Eye Distance:   Bilateral Distance:    Right Eye Near:   Left Eye Near:    Bilateral Near:     Physical Exam  Constitutional:  She appears well-developed and well-nourished. No distress.  Skin: She is not diaphoretic.  Right axilla area with open wound healing well with granulation tissue and slight/minimal serous drainage  Nursing note and vitals reviewed.    UC Treatments / Results  Labs (all labs ordered are listed, but only abnormal results are displayed) Labs Reviewed - No data to display  EKG None  Radiology No results found.  Procedures Procedures (including critical care time)  Medications Ordered in UC Medications - No data to display  Initial Impression / Assessment and Plan / UC Course  I have reviewed the triage vital signs and the nursing notes.  Pertinent labs & imaging results that were available during my care of the patient were reviewed by me and considered in my medical decision making (see chart for details).      Final Clinical Impressions(s) / UC Diagnoses   Final diagnoses:  Visit for wound check  (healing; almost completely closed)   Discharge Instructions     Continue wound  care and oral antibiotic Follow up with surgeon as scheduled    ED Prescriptions    None     1. diagnosis reviewed with patient 2. Continue and finish current antibiotic 3. Follow up with surgeon next week as scheduled.    Controlled Substance Prescriptions [place] Controlled Substance Registry consulted? Not Applicable   Payton Mccallum, MD 10/02/17 [phone]

## 2017-10-02 NOTE — Discharge Instructions (Addendum)
Continue wound care and oral antibiotic Follow up with surgeon as scheduled

## 2017-10-02 NOTE — ED Triage Notes (Signed)
Patient had abscess lanced and drained on Tuesday at the surgery center in [place].  Patient here for wound check.  Patient reports drainage and tenderness from site.  Patient currently on Augmentin. Patient denies fevers.

## 2017-10-05 LAB — ANAEROBIC AND AEROBIC CULTURE

## 2017-10-05 LAB — SPECIMEN STATUS REPORT

## 2017-10-06 ENCOUNTER — Ambulatory Visit (INDEPENDENT_AMBULATORY_CARE_PROVIDER_SITE_OTHER): Payer: BLUE CROSS/BLUE SHIELD | Admitting: Surgery

## 2017-10-06 ENCOUNTER — Encounter: Payer: Self-pay | Admitting: Surgery

## 2017-10-06 VITALS — BP 117/69 | HR 73 | Temp 97.9°F | Resp 14 | Ht 64.0 in | Wt 148.0 lb

## 2017-10-06 DIAGNOSIS — L02419 Cutaneous abscess of limb, unspecified: Secondary | ICD-10-CM

## 2017-10-06 NOTE — Progress Notes (Signed)
Outpatient postop visit  10/06/2017  Debra Richmond is an 27 y.o. female.    Procedure: I&D of axillary abscess  CC: No problems  HPI: This patient underwent the I&D and debridement of an axillary abscess by Dr. Everlene Farrier.  Patient is finishing her antibiotics today unfortunately the antibiogram suggested that it was resistant to penicillins.  However it is essentially completely healed.  Medications reviewed.    Physical Exam:  LMP 09/18/2017 (Approximate)     PE: Healed wound no erythema no drainage no open areas.    Assessment/Plan:  Resolution.  Patient doing very well finishing antibiotics today no follow-up needed unless it returns.  Lattie Haw, MD, FACS

## 2017-10-06 NOTE — Patient Instructions (Signed)
Finish you antibiotics.  Call us if you symptoms return and we will see you in office.

## 2017-12-13 ENCOUNTER — Other Ambulatory Visit: Payer: Self-pay

## 2017-12-13 ENCOUNTER — Ambulatory Visit
Admission: EM | Admit: 2017-12-13 | Discharge: 2017-12-13 | Disposition: A | Discharge status: Home / Self Care | Payer: BLUE CROSS/BLUE SHIELD | Attending: Family Medicine | Admitting: Family Medicine

## 2017-12-13 ENCOUNTER — Ambulatory Visit (INDEPENDENT_AMBULATORY_CARE_PROVIDER_SITE_OTHER): Payer: BLUE CROSS/BLUE SHIELD

## 2017-12-13 ENCOUNTER — Encounter: Payer: Self-pay | Admitting: Emergency Medicine

## 2017-12-13 DIAGNOSIS — S93601A Unspecified sprain of right foot, initial encounter: Secondary | ICD-10-CM

## 2017-12-13 DIAGNOSIS — S93491A Sprain of other ligament of right ankle, initial encounter: Secondary | ICD-10-CM | POA: Diagnosis not present

## 2017-12-13 NOTE — ED Provider Notes (Signed)
[facility]    CSN: [id number] Arrival date & time: 12/13/17  1729     History   Chief Complaint Chief Complaint  Patient presents with  . Foot Pain    HPI Debra Richmond is a 27 y.o. female.   HPI  27 year old female well-known to this practice presents with right foot and ankle pain.  She states that she was walking this morning across the room when she tripped over some laundry that was inadvertently left on the floor.  She twisted her ankle in 2 of inversion and plantar flexion.  At the present time she is complaining of pain over the lateral anterior ankle and also the midfoot.        Past Medical History:  Diagnosis Date  . Anxiety   . GERD (gastroesophageal reflux disease)   . PCOD (polycystic ovarian disease)   . Rotator cuff disorder    right shoulder    Patient Active Problem List   Diagnosis Date Noted  . Cutaneous abscess of left upper extremity 01/21/2016  . PCOS (polycystic ovarian syndrome) 01/14/2016  . Right shoulder strain 08/27/2015  . Right shoulder tendinitis 08/27/2015  . Gastro-esophageal reflux disease with esophagitis 08/14/2012    Past Surgical History:  Procedure Laterality Date  . CHOLECYSTECTOMY  10/19/2012  . WISDOM TOOTH EXTRACTION Bilateral 04/29/2017   upper    OB History   None      Home Medications    Prior to Admission medications   Medication Sig Start Date End Date Taking? Authorizing Provider  clonazePAM (KLONOPIN) 0.5 MG tablet Take by mouth. 02/08/17  Yes [provider]  naproxen (EC NAPROSYN) 500 MG EC tablet Take by mouth. 06/28/15  Yes [provider]    Family History Family History  Problem Relation Age of Onset  . Lupus Mother     Social History Social History   Tobacco Use  . Smoking status: Current Every Day Smoker    Packs/day: 1.00    Types: Cigarettes  . Smokeless tobacco: Never Used  Substance Use Topics  . Alcohol use: Yes    Comment: rarely  . Drug use:  No     Allergies   Prednisone; Dilaudid [hydromorphone hcl]; Hydromorphone; Doxycycline; Garlic; Vancomycin; Clindamycin; and Sulfa antibiotics   Review of Systems Review of Systems  Constitutional: Positive for activity change. Negative for appetite change, chills, fatigue and fever.  Musculoskeletal: Positive for arthralgias and gait problem.  All other systems reviewed and are negative.    Physical Exam Triage Vital Signs ED Triage Vitals  Enc Vitals Group     BP 12/13/17 1738 123/84     Pulse Rate 12/13/17 1738 67     Resp 12/13/17 1738 18     Temp 12/13/17 1738 98.1 F (36.7 C)     Temp Source 12/13/17 1738 Oral     SpO2 12/13/17 1738 99 %     Weight 12/13/17 1737 150 lb (68 kg)     Height 12/13/17 1737 5\' 4"  (1.626 m)     Head Circumference --      Peak Flow --      Pain Score 12/13/17 1737 6     Pain Loc --      Pain Edu? --      Excl. in GC? --    No data found.  Updated Vital Signs BP 123/84 (BP Location: Left Arm)   Pulse 67   Temp 98.1 F (36.7 C) (Oral)   Resp 18  Ht 5\' 4"  (1.626 m)   Wt 150 lb (68 kg)   LMP 11/29/2017 (Approximate)   SpO2 99%   BMI 25.75 kg/m   Visual Acuity Right Eye Distance:   Left Eye Distance:   Bilateral Distance:    Right Eye Near:   Left Eye Near:    Bilateral Near:     Physical Exam  Constitutional: She is oriented to person, place, and time. She appears well-developed and well-nourished. No distress.  HENT:  Head: Normocephalic.  Eyes: Pupils are equal, round, and reactive to light.  Neck: Normal range of motion.  Musculoskeletal: She exhibits tenderness.  Nation of the right foot and ankle shows ecchymosis over the dorsum of the midfoot laterally over the fourth and third metatarsal levels.  She is able to dorsiflex and plantarflex the foot.  Subtalar motion is intact.  There is no pain with compression of the distal tibia fibula.  There is no tenderness over the malleoli bilaterally.  She does have  tenderness over the anterior ankle over the anterior fibulotalar ligament and also over the base of the third and fifth metatarsals.  There is only minimal swelling present.  Neurological: She is alert and oriented to person, place, and time.  Skin: Skin is warm and dry. She is not diaphoretic.  Psychiatric: She has a normal mood and affect. Her behavior is normal. Judgment and thought content normal.  Nursing note and vitals reviewed.    UC Treatments / Results  Labs (all labs ordered are listed, but only abnormal results are displayed) Labs Reviewed - No data to display  EKG None  Radiology Dg Ankle Complete Right  Result Date: 12/13/2017 CLINICAL DATA:  Injury EXAM: RIGHT ANKLE - COMPLETE 3+ VIEW COMPARISON:  09/04/2013 FINDINGS: There is no evidence of fracture, dislocation, or joint effusion. There is no evidence of arthropathy or other focal bone abnormality. Soft tissues are unremarkable. IMPRESSION: Negative. Electronically Signed   By: Marlan Palau M.D.   On: 12/13/2017 18:26   Dg Foot Complete Right  Result Date: 12/13/2017 CLINICAL DATA:  Tripped, injury EXAM: RIGHT FOOT COMPLETE - 3+ VIEW COMPARISON:  12/15/2015 FINDINGS: There is no evidence of fracture or dislocation. There is no evidence of arthropathy or other focal bone abnormality. Soft tissues are unremarkable. IMPRESSION: Negative. Electronically Signed   By: Marlan Palau M.D.   On: 12/13/2017 18:25    Procedures Procedures (including critical care time)  Medications Ordered in UC Medications - No data to display  Initial Impression / Assessment and Plan / UC Course  I have reviewed the triage vital signs and the nursing notes.  Pertinent labs & imaging results that were available during my care of the patient were reviewed by me and considered in my medical decision making (see chart for details).     Plan: 1. Test/x-ray results and diagnosis reviewed with patient 2. rx as per orders; risks, benefits,  potential side effects reviewed with patient 3. Recommend supportive treatment with ice and elevation to control swelling and pain.  Use postop shoe and Ace wrap for comfort and to allow her ambulation.  As tolerated.  Use ibuprofen for pain 4. F/u prn if symptoms worsen or don't improve  Final Clinical Impressions(s) / UC Diagnoses   Final diagnoses:  Sprain of right foot, initial encounter  Sprain of anterior talofibular ligament of right ankle, initial encounter     Discharge Instructions     Apply ice 20 minutes out of every 2 hours 4-5  times daily for comfort.  Elevate your foot and ankle above your heart as necessary for control of pain and swelling.  Use an Ace wrap and postop shoe for comfortable ambulation.    ED Prescriptions    None     Controlled Substance Prescriptions [place] Controlled Substance Registry consulted? Not Applicable   Lutricia Feil, PA-C 12/13/17 1839

## 2017-12-13 NOTE — Discharge Instructions (Addendum)
Apply ice 20 minutes out of every 2 hours 4-5 times daily for comfort.  Elevate your foot and ankle above your heart as necessary for control of pain and swelling.  Use an Ace wrap and postop shoe for comfortable ambulation.

## 2017-12-13 NOTE — ED Triage Notes (Signed)
Patient c/o tripping this morning and injuring her right foot.

## 2018-01-09 ENCOUNTER — Encounter: Payer: Self-pay | Admitting: Emergency Medicine

## 2018-01-09 ENCOUNTER — Ambulatory Visit (INDEPENDENT_AMBULATORY_CARE_PROVIDER_SITE_OTHER): Payer: BLUE CROSS/BLUE SHIELD

## 2018-01-09 ENCOUNTER — Ambulatory Visit
Admission: EM | Admit: 2018-01-09 | Discharge: 2018-01-09 | Disposition: A | Discharge status: Home / Self Care | Payer: BLUE CROSS/BLUE SHIELD | Attending: Family Medicine | Admitting: Family Medicine

## 2018-01-09 ENCOUNTER — Other Ambulatory Visit: Payer: Self-pay

## 2018-01-09 DIAGNOSIS — M25561 Pain in right knee: Secondary | ICD-10-CM

## 2018-01-09 MED ORDER — MELOXICAM 15 MG PO TABS: 15.0000 mg | ORAL_TABLET | Freq: Every day | ORAL | 0 refills | Status: DC | PRN

## 2018-01-09 NOTE — ED Provider Notes (Signed)
[facility]    CSN: [id number] Arrival date & time: 01/09/18  1515  History   Chief Complaint Chief Complaint  Patient presents with  . Knee Pain   HPI  27 year old female presents with right knee pain.  Knee pain  Patient reports a one-week history of right knee pain.  Patient denies injury but states that she uses her knee often to help her handle boxes and lift boxes when she pulls them down off the shelf.  Reports right knee swelling.  Worse with activity.  No relieving factors.  She has been wearing a brace without resolution.  No medications tried.  Pain is moderate in severity.  No other associated symptoms.  Past Medical History:  Diagnosis Date  . Anxiety   . GERD (gastroesophageal reflux disease)   . PCOD (polycystic ovarian disease)   . Rotator cuff disorder    right shoulder    Patient Active Problem List   Diagnosis Date Noted  . Cutaneous abscess of left upper extremity 01/21/2016  . PCOS (polycystic ovarian syndrome) 01/14/2016  . Right shoulder strain 08/27/2015  . Right shoulder tendinitis 08/27/2015  . Gastro-esophageal reflux disease with esophagitis 08/14/2012    Past Surgical History:  Procedure Laterality Date  . CHOLECYSTECTOMY  10/19/2012  . WISDOM TOOTH EXTRACTION Bilateral 04/29/2017   upper    OB History   None      Home Medications    Prior to Admission medications   Medication Sig Start Date End Date Taking? Authorizing Provider  clonazePAM (KLONOPIN) 0.5 MG tablet Take by mouth. 02/08/17  Yes [provider]  naproxen (EC NAPROSYN) 500 MG EC tablet Take by mouth. 06/28/15  Yes [provider]  meloxicam (MOBIC) 15 MG tablet Take 1 tablet (15 mg total) by mouth daily as needed. 01/09/18   Tommie Samsook, Erhard Senske G, DO    Family History Family History  Problem Relation Age of Onset  . Lupus Mother     Social History Social History   Tobacco Use  . Smoking status: Former Smoker    Packs/day:  1.00    Types: Cigarettes  . Smokeless tobacco: Never Used  Substance Use Topics  . Alcohol use: Yes    Comment: rarely  . Drug use: No     Allergies   Prednisone; Dilaudid [hydromorphone hcl]; Hydromorphone; Doxycycline; Garlic; Vancomycin; Clindamycin; and Sulfa antibiotics   Review of Systems Review of Systems  Constitutional: Negative.   Musculoskeletal:       Right knee pain, swelling.    Physical Exam Triage Vital Signs ED Triage Vitals  Enc Vitals Group     BP 01/09/18 1523 123/78     Pulse Rate 01/09/18 1523 75     Resp 01/09/18 1523 16     Temp 01/09/18 1523 98.8 F (37.1 C)     Temp Source 01/09/18 1523 Oral     SpO2 01/09/18 1523 99 %     Weight 01/09/18 1522 150 lb (68 kg)     Height 01/09/18 1522 5\' 4"  (1.626 m)     Head Circumference --      Peak Flow --      Pain Score 01/09/18 1521 7     Pain Loc --      Pain Edu? --      Excl. in GC? --    Updated Vital Signs BP 123/78 (BP Location: Left Arm)   Pulse 75   Temp 98.8 F (37.1 C) (Oral)   Resp 16  Ht 5\' 4"  (1.626 m)   Wt 68 kg   LMP 01/02/2018 (Approximate) Comment: denies preg  SpO2 99%   BMI 25.75 kg/m   Visual Acuity Right Eye Distance:   Left Eye Distance:   Bilateral Distance:    Right Eye Near:   Left Eye Near:    Bilateral Near:     Physical Exam  Constitutional: She is oriented to person, place, and time. She appears well-developed. No distress.  HENT:  Head: Normocephalic and atraumatic.  Pulmonary/Chest: Effort normal. No respiratory distress.  Musculoskeletal:  Right knee Inspection with likely small effusion. Palpation with mild tenderness with compression of the patella. Ligaments intact.  Neurological: She is alert and oriented to person, place, and time.  Skin: Skin is warm. No rash noted.  Psychiatric: She has a normal mood and affect. Her behavior is normal.  Nursing note and vitals reviewed.  UC Treatments / Results  Labs (all labs ordered are listed, but  only abnormal results are displayed) Labs Reviewed - No data to display  EKG None  Radiology Dg Knee Complete 4 Views Right  Result Date: 01/09/2018 CLINICAL DATA:  Right knee pain for 1 week with no known injury. EXAM: RIGHT KNEE - COMPLETE 4+ VIEW COMPARISON:  None. FINDINGS: No evidence of fracture, dislocation, or joint effusion. No evidence of arthropathy or other focal bone abnormality. Soft tissues are unremarkable. IMPRESSION: Negative. Electronically Signed   By: Deatra RobinsonKevin  Herman M.D.   On: 01/09/2018 16:03    Procedures Procedures (including critical care time)  Medications Ordered in UC Medications - No data to display  Initial Impression / Assessment and Plan / UC Course  I have reviewed the triage vital signs and the nursing notes.  Pertinent labs & imaging results that were available during my care of the patient were reviewed by me and considered in my medical decision making (see chart for details).    27 year old female resents with acute right knee pain.  Advised rest, ice, compression, and elevation.  X-ray negative.  Meloxicam knee.  If persists, see orthopedics.  Final Clinical Impressions(s) / UC Diagnoses   Final diagnoses:  Acute pain of right knee     Discharge Instructions     Rest, ice, elevation.  Medication as needed.  If persists, see Ortho.  Take care  Dr. Adriana Simasook    ED Prescriptions    Medication Sig Dispense Auth. Provider   meloxicam (MOBIC) 15 MG tablet Take 1 tablet (15 mg total) by mouth daily as needed. 30 tablet Tommie Samsook, Shawnee Gambone G, DO     Controlled Substance Prescriptions [place] Controlled Substance Registry consulted? Not Applicable   Tommie SamsCook, Shamiracle Gorden G, DO 01/09/18 1637

## 2018-01-09 NOTE — Discharge Instructions (Signed)
Rest, ice, elevation.  Medication as needed.  If persists, see Ortho.  Take care  Dr. Adriana Simasook

## 2018-01-09 NOTE — ED Triage Notes (Signed)
Patient c/o right knee pain that started 1 week ago. Denies injury or falls.

## 2018-01-24 ENCOUNTER — Encounter: Payer: Self-pay | Admitting: Emergency Medicine

## 2018-01-24 ENCOUNTER — Ambulatory Visit
Admission: EM | Admit: 2018-01-24 | Discharge: 2018-01-24 | Disposition: A | Discharge status: Home / Self Care | Payer: BLUE CROSS/BLUE SHIELD | Attending: Family Medicine | Admitting: Family Medicine

## 2018-01-24 ENCOUNTER — Other Ambulatory Visit: Payer: Self-pay

## 2018-01-24 ENCOUNTER — Ambulatory Visit (INDEPENDENT_AMBULATORY_CARE_PROVIDER_SITE_OTHER): Payer: BLUE CROSS/BLUE SHIELD

## 2018-01-24 DIAGNOSIS — S99912A Unspecified injury of left ankle, initial encounter: Secondary | ICD-10-CM

## 2018-01-24 NOTE — ED Provider Notes (Signed)
[facility]    CSN: [id number] Arrival date & time: 01/24/18  1705  History   Chief Complaint Chief Complaint  Patient presents with  . Ankle Injury   HPI  27 year old female presents with left ankle pain.  Patient suffered an injury on 8/19.  She states that she was outside.  She was off of her deck when a corn hole board fell on her left ankle.  Patient went to the ER and x-rays were negative.  Supportive care was advised.  Patient states that she continues to have pain, which is particular worse with activity.  She is had difficulty working as a result.  Patient was told that she should be reevaluated if she fails to improve or worsen.  She states that repeat x-rays were recommended by the ER physician if she fails to improve.  Pain is moderate.  Improves with rest.  Worse with activity.  No current swelling.  She does have some evidence of bruising.  No other associated symptoms.  No other complaints.  PMH, Surgical Hx, Family Hx, Social History reviewed and updated as below. Past Medical History:  Diagnosis Date  . Anxiety   . GERD (gastroesophageal reflux disease)   . PCOD (polycystic ovarian disease)   . Rotator cuff disorder    right shoulder   Patient Active Problem List   Diagnosis Date Noted  . Cutaneous abscess of left upper extremity 01/21/2016  . PCOS (polycystic ovarian syndrome) 01/14/2016  . Right shoulder strain 08/27/2015  . Right shoulder tendinitis 08/27/2015  . Gastro-esophageal reflux disease with esophagitis 08/14/2012   Past Surgical History:  Procedure Laterality Date  . CHOLECYSTECTOMY  10/19/2012  . WISDOM TOOTH EXTRACTION Bilateral 04/29/2017   upper   OB History   None    Home Medications    Prior to Admission medications   Not on File    Family History Family History  Problem Relation Age of Onset  . Lupus Mother     Social History Social History   Tobacco Use  . Smoking status: Former Smoker    Packs/day: 1.00      Types: Cigarettes  . Smokeless tobacco: Never Used  Substance Use Topics  . Alcohol use: Yes    Comment: rarely  . Drug use: No     Allergies   Prednisone; Dilaudid [hydromorphone hcl]; Hydromorphone; Doxycycline; Garlic; Vancomycin; Clindamycin; and Sulfa antibiotics   Review of Systems Review of Systems  Constitutional: Negative.   Musculoskeletal:       Left ankle pain.   Physical Exam Triage Vital Signs ED Triage Vitals  Enc Vitals Group     BP 01/24/18 1724 122/83     Pulse Rate 01/24/18 1724 69     Resp 01/24/18 1724 18     Temp 01/24/18 1724 98.2 F (36.8 C)     Temp Source 01/24/18 1724 Oral     SpO2 01/24/18 1724 100 %     Weight 01/24/18 1721 148 lb (67.1 kg)     Height 01/24/18 1721 5\' 4"  (1.626 m)     Head Circumference --      Peak Flow --      Pain Score 01/24/18 1721 4     Pain Loc --      Pain Edu? --    Updated Vital Signs BP 122/83 (BP Location: Left Arm)   Pulse 69   Temp 98.2 F (36.8 C) (Oral)   Resp 18   Ht 5\' 4"  (1.626 m)  Wt 67.1 kg   LMP 01/02/2018 (Approximate) Comment: denies preg  SpO2 100%   BMI 25.40 kg/m   Visual Acuity Right Eye Distance:   Left Eye Distance:   Bilateral Distance:    Right Eye Near:   Left Eye Near:    Bilateral Near:     Physical Exam  Constitutional: She is oriented to person, place, and time. She appears well-developed. No distress.  HENT:  Head: Normocephalic and atraumatic.  Pulmonary/Chest: Effort normal. No respiratory distress.  Musculoskeletal:  Left ankle -lateral malleolus with tenderness to palpation.  Patient has some bruising on the dorsum of the foot.  Normal range of motion.  Neurological: She is alert and oriented to person, place, and time.  Psychiatric: She has a normal mood and affect. Her behavior is normal.  Nursing note and vitals reviewed.  UC Treatments / Results  Labs (all labs ordered are listed, but only abnormal results are displayed) Labs Reviewed - No data to  display  EKG None  Radiology Dg Ankle Complete Left  Result Date: 01/24/2018 CLINICAL DATA:  Ankle pain after corn hole board fell on her 9 days ago. EXAM: LEFT ANKLE COMPLETE - 3+ VIEW COMPARISON:  None. FINDINGS: There is no evidence of fracture, dislocation, or joint effusion. There is no evidence of arthropathy or other focal bone abnormality. Soft tissues are unremarkable. IMPRESSION: No fracture or dislocation of the left ankle. Electronically Signed   By: Deatra RobinsonKevin  Herman M.D.   On: 01/24/2018 18:05    Procedures Procedures (including critical care time)  Medications Ordered in UC Medications - No data to display  Initial Impression / Assessment and Plan / UC Course  I have reviewed the triage vital signs and the nursing notes.  Pertinent labs & imaging results that were available during my care of the patient were reviewed by me and considered in my medical decision making (see chart for details).    27 year old female presents with an ankle injury.  X-ray negative.  Cam walker for comfort. Mobic (previously prescribed) as needed.   Final Clinical Impressions(s) / UC Diagnoses   Final diagnoses:  Injury of left ankle, initial encounter     Discharge Instructions     No fracture.  Rest  Take care  Dr. Adriana Simasook    ED Prescriptions    None     Controlled Substance Prescriptions [place] Controlled Substance Registry consulted? Not Applicable   Tommie SamsCook, Zohar Maroney G, DO 01/24/18 1849

## 2018-01-24 NOTE — Discharge Instructions (Signed)
No fracture.  Rest  Take care  Dr. Adriana Simasook

## 2018-01-24 NOTE — ED Triage Notes (Signed)
Patient c/o corn hole board falling on her left ankle 9 days ago. [facility] and she was told it was too swollen to see a fracture. She was told to f/u with no improvement. She has been out of work for 1 week. Patient continues to have pain in her left ankle.

## 2018-03-20 ENCOUNTER — Encounter: Payer: Self-pay | Admitting: Emergency Medicine

## 2018-03-20 ENCOUNTER — Other Ambulatory Visit: Payer: Self-pay

## 2018-03-20 ENCOUNTER — Emergency Department
Admission: EM | Admit: 2018-03-20 | Discharge: 2018-03-20 | Disposition: A | Discharge status: Home / Self Care | Payer: BLUE CROSS/BLUE SHIELD | Attending: Emergency Medicine | Admitting: Emergency Medicine

## 2018-03-20 DIAGNOSIS — X58XXXA Exposure to other specified factors, initial encounter: Secondary | ICD-10-CM | POA: Insufficient documentation

## 2018-03-20 DIAGNOSIS — Y929 Unspecified place or not applicable: Secondary | ICD-10-CM | POA: Insufficient documentation

## 2018-03-20 DIAGNOSIS — Z87891 Personal history of nicotine dependence: Secondary | ICD-10-CM | POA: Insufficient documentation

## 2018-03-20 DIAGNOSIS — Y999 Unspecified external cause status: Secondary | ICD-10-CM | POA: Insufficient documentation

## 2018-03-20 DIAGNOSIS — S81812A Laceration without foreign body, left lower leg, initial encounter: Secondary | ICD-10-CM | POA: Insufficient documentation

## 2018-03-20 DIAGNOSIS — Y9389 Activity, other specified: Secondary | ICD-10-CM | POA: Insufficient documentation

## 2018-03-20 MED ORDER — TRAMADOL HCL 50 MG PO TABS: 50.0000 mg | ORAL_TABLET | Freq: Two times a day (BID) | ORAL | 0 refills | Status: DC | PRN

## 2018-03-20 MED ORDER — LIDOCAINE HCL (PF) 1 % IJ SOLN
INTRAMUSCULAR | Status: AC
  Filled 2018-03-20: qty 5

## 2018-03-20 MED ORDER — LIDOCAINE HCL (PF) 1 % IJ SOLN
INTRAMUSCULAR | Status: AC
  Administered 2018-03-20: 5 mL
  Filled 2018-03-20: qty 5

## 2018-03-20 MED ORDER — BACITRACIN-NEOMYCIN-POLYMYXIN 400-5-5000 EX OINT
TOPICAL_OINTMENT | Freq: Once | CUTANEOUS | Status: AC
  Administered 2018-03-20: 1 via TOPICAL
  Filled 2018-03-20: qty 1

## 2018-03-20 MED ORDER — TRAMADOL HCL 50 MG PO TABS
50.0000 mg | ORAL_TABLET | Freq: Once | ORAL | Status: DC
  Filled 2018-03-20: qty 1

## 2018-03-20 NOTE — ED Triage Notes (Signed)
Laceration L lower leg. Was chasing dog one hour ago, unsure what cut on.

## 2018-03-20 NOTE — ED Notes (Signed)
Chasing dog and cut left leg on a table or something.  Has lac on left inner lower leg.  Bleeding is controlled.

## 2018-03-20 NOTE — ED Provider Notes (Signed)
[facility] Emergency Department Provider Note   ____________________________________________   First MD Initiated Contact with Patient 03/20/18 [phone]     (approximate)  I have reviewed the triage vital signs and the nursing notes.   HISTORY  Chief Complaint Laceration    HPI Debra Richmond is a 27 y.o. female patient presents with laceration to left lower leg.  Patient that she was chasing her dog and was unsure when she cut her leg on.  Patient state bleeding is controlled with direct pressure.  Patient denies loss of sensation or loss of function of the left lower extremity.  Patient rates the pain a 6/10.  Patient described the pain is "sore".  No palliative measures prior to arrival.  Past Medical History:  Diagnosis Date  . Anxiety   . GERD (gastroesophageal reflux disease)   . PCOD (polycystic ovarian disease)   . Rotator cuff disorder    right shoulder    Patient Active Problem List   Diagnosis Date Noted  . Cutaneous abscess of left upper extremity 01/21/2016  . PCOS (polycystic ovarian syndrome) 01/14/2016  . Right shoulder strain 08/27/2015  . Right shoulder tendinitis 08/27/2015  . Gastro-esophageal reflux disease with esophagitis 08/14/2012    Past Surgical History:  Procedure Laterality Date  . CHOLECYSTECTOMY  10/19/2012  . WISDOM TOOTH EXTRACTION Bilateral 04/29/2017   upper    Prior to Admission medications   Medication Sig Start Date End Date Taking? Authorizing Provider  traMADol (ULTRAM) 50 MG tablet Take 1 tablet (50 mg total) by mouth every 12 (twelve) hours as needed. 03/20/18   Joni Reining, PA-C    Allergies Prednisone; Dilaudid [hydromorphone hcl]; Hydromorphone; Doxycycline; Garlic; Vancomycin; Clindamycin; and Sulfa antibiotics  Family History  Problem Relation Age of Onset  . Lupus Mother     Social History Social History   Tobacco Use  . Smoking status: Former Smoker    Packs/day: 1.00    Types:  Cigarettes  . Smokeless tobacco: Never Used  Substance Use Topics  . Alcohol use: Yes    Comment: rarely  . Drug use: No    Review of Systems Constitutional: No fever/chills Eyes: No visual changes. ENT: No sore throat. Cardiovascular: Denies chest pain. Respiratory: Denies shortness of breath. Gastrointestinal: No abdominal pain.  No nausea, no vomiting.  No diarrhea.  No constipation. Genitourinary: Negative for dysuria. Musculoskeletal: Negative for back pain. Skin: Negative for rash.  Laceration left lower leg. Neurological: Negative for headaches, focal weakness or numbness. Psychiatric:Anxiety. Allergic/Immunilogical: See medication list.  ____________________________________________   PHYSICAL EXAM:  VITAL SIGNS: ED Triage Vitals  Enc Vitals Group     BP 03/20/18 0823 (!) 198/88     Pulse Rate 03/20/18 0823 80     Resp 03/20/18 0823 18     Temp 03/20/18 0823 98 F (36.7 C)     Temp Source 03/20/18 0823 Oral     SpO2 03/20/18 0823 98 %     Weight 03/20/18 0824 150 lb (68 kg)     Height 03/20/18 0824 5\' 3"  (1.6 m)     Head Circumference --      Peak Flow --      Pain Score 03/20/18 0824 6     Pain Loc --      Pain Edu? --      Excl. in GC? --     Constitutional: Alert and oriented. Well appearing and in no acute distress. Cardiovascular: Normal rate, regular rhythm. Grossly normal heart  sounds.  Good peripheral circulation. Respiratory: Normal respiratory effort.  No retractions. Lungs CTAB. Musculoskeletal: No lower extremity tenderness nor edema.  No joint effusions. Neurologic:  Normal speech and language. No gross focal neurologic deficits are appreciated. No gait instability. Skin:  Skin is warm, dry and intact. No rash noted.  3 cm laceration left lower leg. Psychiatric: Mood and affect are normal. Speech and behavior are normal.  ____________________________________________   LABS (all labs ordered are listed, but only abnormal results are  displayed)  Labs Reviewed - No data to display ____________________________________________  EKG   ____________________________________________  RADIOLOGY  ED MD interpretation:    Official radiology report(s): No results found.  ____________________________________________   PROCEDURES  Procedure(s) performed: None  .Marland KitchenLaceration Repair Date/Time: 03/20/2018 10:12 AM Performed by: Henreitta Leber Authorized by: Joni Reining, PA-C   Consent:    Consent obtained:  Verbal   Consent given by:  Patient   Risks discussed:  Infection, pain, poor cosmetic result and poor wound healing Anesthesia (see MAR for exact dosages):    Anesthesia method:  Local infiltration   Local anesthetic:  Lidocaine 1% w/o epi Laceration details:    Location:  Leg   Leg location:  L lower leg   Length (cm):  3 Repair type:    Repair type:  Simple Pre-procedure details:    Preparation:  Patient was prepped and draped in usual sterile fashion Exploration:    Contaminated: no   Treatment:    Area cleansed with:  Betadine and saline   Amount of cleaning:  Standard   Visualized foreign bodies/material removed: no   Skin repair:    Repair method:  Sutures   Suture size:  3-0   Suture material:  Nylon   Suture technique:  Simple interrupted   Number of sutures:  9 Post-procedure details:    Dressing:  Antibiotic ointment and sterile dressing   Patient tolerance of procedure:  Tolerated well, no immediate complications    Critical Care performed: No  ____________________________________________   INITIAL IMPRESSION / ASSESSMENT AND PLAN / ED COURSE  As part of my medical decision making, I reviewed the following data within the electronic MEDICAL RECORD NUMBER    Left lower leg laceration.  Area was cleaned and sutured.  Patient given discharge care instructions.  Patient advised to return back in 10 days to have suture removal.  Take medication as directed.       ____________________________________________   FINAL CLINICAL IMPRESSION(S) / ED DIAGNOSES  Final diagnoses:  Laceration of left lower extremity, initial encounter     ED Discharge Orders         Ordered    traMADol (ULTRAM) 50 MG tablet  Every 12 hours PRN     03/20/18 1016           Note:  This document was prepared using Dragon voice recognition software and may include unintentional dictation errors.    Joni Reining, PA-C 03/20/18 1652    Sharman Cheek, MD 03/22/18 [phone]

## 2018-06-01 ENCOUNTER — Other Ambulatory Visit: Payer: Self-pay

## 2018-06-01 ENCOUNTER — Emergency Department
Admission: EM | Admit: 2018-06-01 | Discharge: 2018-06-01 | Disposition: A | Discharge status: Home / Self Care | Payer: BLUE CROSS/BLUE SHIELD | Attending: Emergency Medicine | Admitting: Emergency Medicine

## 2018-06-01 ENCOUNTER — Emergency Department: Payer: BLUE CROSS/BLUE SHIELD

## 2018-06-01 DIAGNOSIS — Z87891 Personal history of nicotine dependence: Secondary | ICD-10-CM | POA: Insufficient documentation

## 2018-06-01 DIAGNOSIS — M5410 Radiculopathy, site unspecified: Secondary | ICD-10-CM | POA: Insufficient documentation

## 2018-06-01 DIAGNOSIS — M545 Low back pain: Secondary | ICD-10-CM | POA: Diagnosis present

## 2018-06-01 LAB — POCT PREGNANCY, URINE: PREG TEST UR: NEGATIVE

## 2018-06-01 MED ORDER — CYCLOBENZAPRINE HCL 10 MG PO TABS: 10.0000 mg | ORAL_TABLET | Freq: Three times a day (TID) | ORAL | 0 refills | Status: DC | PRN

## 2018-06-01 MED ORDER — IBUPROFEN 600 MG PO TABS: 600.0000 mg | ORAL_TABLET | Freq: Three times a day (TID) | ORAL | 0 refills | Status: DC | PRN

## 2018-06-01 NOTE — ED Provider Notes (Signed)
[facility] Emergency Department Provider Note   ____________________________________________   First MD Initiated Contact with Patient 06/01/18 1248     (approximate)  I have reviewed the triage vital signs and the nursing notes.   HISTORY  Chief Complaint Back Pain    HPI Kynzie Snedaker is a 28 y.o. female patient complain radicular back pain for 2 weeks that has increased in the past couple days.  Patient denies provocative incident for complaint.  Patient denies bladder bowel dysfunction.  No palliative measure for complaint.  Past Medical History:  Diagnosis Date  . Anxiety   . GERD (gastroesophageal reflux disease)   . PCOD (polycystic ovarian disease)   . Rotator cuff disorder    right shoulder    Patient Active Problem List   Diagnosis Date Noted  . Cutaneous abscess of left upper extremity 01/21/2016  . PCOS (polycystic ovarian syndrome) 01/14/2016  . Right shoulder strain 08/27/2015  . Right shoulder tendinitis 08/27/2015  . Gastro-esophageal reflux disease with esophagitis 08/14/2012    Past Surgical History:  Procedure Laterality Date  . CHOLECYSTECTOMY  10/19/2012  . WISDOM TOOTH EXTRACTION Bilateral 04/29/2017   upper    Prior to Admission medications   Medication Sig Start Date End Date Taking? Authorizing Provider  cyclobenzaprine (FLEXERIL) 10 MG tablet Take 1 tablet (10 mg total) by mouth 3 (three) times daily as needed. 06/01/18   Joni Reining, PA-C  ibuprofen (ADVIL,MOTRIN) 600 MG tablet Take 1 tablet (600 mg total) by mouth every 8 (eight) hours as needed. 06/01/18   Joni Reining, PA-C  traMADol (ULTRAM) 50 MG tablet Take 1 tablet (50 mg total) by mouth every 12 (twelve) hours as needed. 03/20/18   Joni Reining, PA-C    Allergies Prednisone; Dilaudid [hydromorphone hcl]; Hydromorphone; Doxycycline; Garlic; Vancomycin; Clindamycin; and Sulfa antibiotics  Family History  Problem Relation Age of Onset  . Lupus  Mother     Social History Social History   Tobacco Use  . Smoking status: Former Smoker    Packs/day: 1.00    Types: Cigarettes  . Smokeless tobacco: Never Used  Substance Use Topics  . Alcohol use: Yes    Comment: rarely  . Drug use: No    Review of Systems Constitutional: No fever/chills Eyes: No visual changes. ENT: No sore throat. Cardiovascular: Denies chest pain. Respiratory: Denies shortness of breath. Gastrointestinal: No abdominal pain.  No nausea, no vomiting.  No diarrhea.  No constipation. Genitourinary: Negative for dysuria. Musculoskeletal: Positive for back pain. Skin: Negative for rash. Neurological: Negative for headaches, focal weakness or numbness. Psychiatric:Anxiety Allergic/Immunilogical: See medication list. ____________________________________________   PHYSICAL EXAM:  VITAL SIGNS: ED Triage Vitals [06/01/18 1220]  Enc Vitals Group     BP 122/70     Pulse Rate 77     Resp 18     Temp 97.8 F (36.6 C)     Temp Source Oral     SpO2 99 %     Weight 153 lb (69.4 kg)     Height 5\' 4"  (1.626 m)     Head Circumference      Peak Flow      Pain Score      Pain Loc      Pain Edu?      Excl. in GC?     Constitutional: Alert and oriented. Well appearing and in no acute distress. Cardiovascular: Normal rate, regular rhythm. Grossly normal heart sounds.  Good peripheral circulation. Respiratory: Normal respiratory  effort.  No retractions. Lungs CTAB. Gastrointestinal: Soft and nontender. No distention. No abdominal bruits. No CVA tenderness. Musculoskeletal: No obvious lumbar spine deformity.  Patient has moderate guarding palpation L4-S1.  Patient has decreased range of motion with flexion.  Patient negative straight leg test in the supine position. Neurologic:  Normal speech and language. No gross focal neurologic deficits are appreciated. No gait instability. Skin:  Skin is warm, dry and intact. No rash noted. Psychiatric: Mood and affect  are normal. Speech and behavior are normal.  ____________________________________________   LABS (all labs ordered are listed, but only abnormal results are displayed)  Labs Reviewed  POC URINE PREG, ED  POCT PREGNANCY, URINE   ____________________________________________  EKG   ____________________________________________  RADIOLOGY  ED MD interpretation:    Official radiology report(s): Dg Lumbar Spine 2-3 Views  Result Date: 06/01/2018 CLINICAL DATA:  Increasing back pain EXAM: LUMBAR SPINE - 2-3 VIEW COMPARISON:  CT 04/02/2013 FINDINGS: Surgical clips in the right upper quadrant. Lumbar alignment within normal limits. Vertebral body heights and disc spaces are within normal limits. IMPRESSION: Negative. Electronically Signed   By: Jasmine PangKim  Fujinaga M.D.   On: 06/01/2018 14:21    ____________________________________________   PROCEDURES  Procedure(s) performed: None  Procedures  Critical Care performed: No  ____________________________________________   INITIAL IMPRESSION / ASSESSMENT AND PLAN / ED COURSE  As part of my medical decision making, I reviewed the following data within the electronic MEDICAL RECORD NUMBER    Radicular back pain.  Discussed x-ray findings with patient.  Patient given discharge care instruction advised take medication as directed.  Patient given a work note and advised to follow-up with PCP if condition persist      ____________________________________________   FINAL CLINICAL IMPRESSION(S) / ED DIAGNOSES  Final diagnoses:  Radicular low back pain     ED Discharge Orders         Ordered    cyclobenzaprine (FLEXERIL) 10 MG tablet  3 times daily PRN     06/01/18 1437    ibuprofen (ADVIL,MOTRIN) 600 MG tablet  Every 8 hours PRN     06/01/18 1437           Note:  This document was prepared using Dragon voice recognition software and may include unintentional dictation errors.    Joni ReiningSmith, Grizelda Piscopo K, PA-C 06/01/18 1441      Emily FilbertWilliams, Jonathan E, MD 06/01/18 [phone]

## 2018-06-01 NOTE — ED Notes (Signed)
See triage note  Presents with lower back pain  States pain is mainly in mid lower back and does radiate into both leg  sxs' started about 1 week ago  But states pain has increased   Ambulates well  Denies any urinary sx's

## 2018-06-01 NOTE — ED Triage Notes (Signed)
Pt c/o lower back pain for the past couple of days that radiates into Bl LE. Denies injury,

## 2018-06-28 ENCOUNTER — Emergency Department
Admission: EM | Admit: 2018-06-28 | Discharge: 2018-06-28 | Disposition: A | Discharge status: Home / Self Care | Payer: BLUE CROSS/BLUE SHIELD | Attending: Emergency Medicine | Admitting: Emergency Medicine

## 2018-06-28 ENCOUNTER — Encounter: Payer: Self-pay | Admitting: Emergency Medicine

## 2018-06-28 DIAGNOSIS — Z79899 Other long term (current) drug therapy: Secondary | ICD-10-CM | POA: Insufficient documentation

## 2018-06-28 DIAGNOSIS — Z87891 Personal history of nicotine dependence: Secondary | ICD-10-CM | POA: Diagnosis not present

## 2018-06-28 DIAGNOSIS — R300 Dysuria: Secondary | ICD-10-CM | POA: Insufficient documentation

## 2018-06-28 LAB — URINALYSIS, COMPLETE (UACMP) WITH MICROSCOPIC
BILIRUBIN URINE: NEGATIVE
Glucose, UA: NEGATIVE mg/dL
HGB URINE DIPSTICK: NEGATIVE
KETONES UR: NEGATIVE mg/dL
LEUKOCYTES UA: NEGATIVE
NITRITE: NEGATIVE
PH: 6 (ref 5.0–8.0)
Protein, ur: NEGATIVE mg/dL
SPECIFIC GRAVITY, URINE: 1.013 (ref 1.005–1.030)

## 2018-06-28 LAB — PREGNANCY, URINE: Preg Test, Ur: NEGATIVE

## 2018-06-28 LAB — WET PREP, GENITAL
Clue Cells Wet Prep HPF POC: NONE SEEN
SPERM: NONE SEEN
Trich, Wet Prep: NONE SEEN
Yeast Wet Prep HPF POC: NONE SEEN

## 2018-06-28 LAB — CHLAMYDIA/NGC RT PCR (ARMC ONLY)
CHLAMYDIA TR: NOT DETECTED
N GONORRHOEAE: NOT DETECTED

## 2018-06-28 NOTE — ED Triage Notes (Signed)
Pt reports thinks she has a UTI. Pt c/o burning when she urinates, back pain and urgency.

## 2018-06-28 NOTE — ED Provider Notes (Signed)
[facility] Emergency Department Provider Note ____________________________________________  Time seen: 1300  I have reviewed the triage vital signs and the nursing notes.  HISTORY  Chief Complaint  Urinary Frequency; Dysuria; and Back Pain  History as told to Juanda CrumbleMariah Teague, PA-S Sherrie Sport([place])  HPI Debra Richmond is a 28 y.o. female presents to the ED for evaluation of dysuria, described as burning. She reports several days of symptoms, without abnormal bleeding or vaginal discharge. She is without fevers, nausea, abdominal or flank pain. She reports a history of irregular menses and previous abnormal pap smear.   Past Medical History:  Diagnosis Date  . Anxiety   . GERD (gastroesophageal reflux disease)   . PCOD (polycystic ovarian disease)   . Rotator cuff disorder    right shoulder    Patient Active Problem List   Diagnosis Date Noted  . Cutaneous abscess of left upper extremity 01/21/2016  . PCOS (polycystic ovarian syndrome) 01/14/2016  . Right shoulder strain 08/27/2015  . Right shoulder tendinitis 08/27/2015  . Gastro-esophageal reflux disease with esophagitis 08/14/2012    Past Surgical History:  Procedure Laterality Date  . CHOLECYSTECTOMY  10/19/2012  . WISDOM TOOTH EXTRACTION Bilateral 04/29/2017   upper    Prior to Admission medications   Medication Sig Start Date End Date Taking? Authorizing Provider  cyclobenzaprine (FLEXERIL) 10 MG tablet Take 1 tablet (10 mg total) by mouth 3 (three) times daily as needed. 06/01/18   Joni ReiningSmith, Ronald K, PA-C  ibuprofen (ADVIL,MOTRIN) 600 MG tablet Take 1 tablet (600 mg total) by mouth every 8 (eight) hours as needed. 06/01/18   Joni ReiningSmith, Ronald K, PA-C  traMADol (ULTRAM) 50 MG tablet Take 1 tablet (50 mg total) by mouth every 12 (twelve) hours as needed. 03/20/18   Joni ReiningSmith, Ronald K, PA-C    Allergies Prednisone; Dilaudid [hydromorphone hcl]; Hydromorphone; Doxycycline; Garlic; Vancomycin; Clindamycin; and Sulfa  antibiotics  Family History  Problem Relation Age of Onset  . Lupus Mother     Social History Social History   Tobacco Use  . Smoking status: Former Smoker    Packs/day: 1.00    Types: Cigarettes  . Smokeless tobacco: Never Used  Substance Use Topics  . Alcohol use: Yes    Comment: rarely  . Drug use: No    Review of Systems  Constitutional: Negative for fever. Eyes: Negative for visual changes. ENT: Negative for sore throat. Cardiovascular: Negative for chest pain. Respiratory: Negative for shortness of breath. Gastrointestinal: Negative for abdominal pain, vomiting and diarrhea. Genitourinary: Positive for dysuria. Musculoskeletal: Negative for back pain. Skin: Negative for rash. Neurological: Negative for headaches, focal weakness or numbness. ____________________________________________  PHYSICAL EXAM:  VITAL SIGNS: ED Triage Vitals  Enc Vitals Group     BP 06/28/18 1134 127/75     Pulse Rate 06/28/18 1134 78     Resp 06/28/18 1134 20     Temp 06/28/18 1134 98 F (36.7 C)     Temp Source 06/28/18 1134 Oral     SpO2 06/28/18 1134 97 %     Weight 06/28/18 1132 155 lb (70.3 kg)     Height 06/28/18 1132 5\' 4"  (1.626 m)     Head Circumference --      Peak Flow --      Pain Score 06/28/18 1132 4     Pain Loc --      Pain Edu? --      Excl. in GC? --     Constitutional: Alert and oriented. Well appearing  and in no distress. Head: Normocephalic and atraumatic. Eyes: Conjunctivae are normal. Normal extraocular movements Cardiovascular: Normal rate, regular rhythm. Normal distal pulses. Respiratory: Normal respiratory effort. No wheezes/rales/rhonchi. Gastrointestinal: Soft and nontender. No distention. GU: normal external genitalia. Erythematous vaginal canal. Scant white, thin discharge.  Musculoskeletal: Nontender with normal range of motion in all extremities.  Neurologic:  Normal gait without ataxia. Normal speech and language. No gross focal neurologic  deficits are appreciated. Skin:  Skin is warm, dry and intact. No rash noted. ____________________________________________   LABS (pertinent positives/negatives) Labs Reviewed  WET PREP, GENITAL - Abnormal; Notable for the following components:      Result Value   WBC, Wet Prep HPF POC FEW (*)    All other components within normal limits  URINALYSIS, COMPLETE (UACMP) WITH MICROSCOPIC - Abnormal; Notable for the following components:   Color, Urine YELLOW (*)    APPearance CLEAR (*)    Bacteria, UA RARE (*)    All other components within normal limits  CHLAMYDIA/NGC RT PCR ([facility] ONLY)  PREGNANCY, URINE  ____________________________________________  PROCEDURES  Procedures ____________________________________________  INITIAL IMPRESSION / ASSESSMENT AND PLAN / ED COURSE  Patient with ED evaluation of several days of dysuria without significant vaginal discharge or abnormal vaginal bleeding.  Patient describes mild back pain and some urinary urgency.  She is reassured by her negative exam and labs at this time.  Patient is encouraged to follow-up with her OB provider or 1 of the local community clinics for further evaluation and follow-up testing to her previous abnormal Pap.  She is also been advised to take probiotics at this time.  She should return to the ED if necessary. ____________________________________________  FINAL CLINICAL IMPRESSION(S) / ED DIAGNOSES  Final diagnoses:  Dysuria      Karmen Stabs, Charlesetta Ivory, PA-C 06/28/18 1814    Sharman Cheek, MD 06/29/18 [phone]

## 2018-06-28 NOTE — Discharge Instructions (Signed)
You exam and labs are negative. Follow--up with one of the local community clinics for follow-up testing and a routine pap smear. Return as needed.

## 2018-11-27 IMAGING — CR DG KNEE COMPLETE 4+V*R*
4 series · 4 of 4 positions shown · non-contrast
Comparison: None.

CLINICAL DATA: Right knee pain for 1 week with no known injury.

EXAM:
RIGHT KNEE - COMPLETE 4+ VIEW

[knee ap]
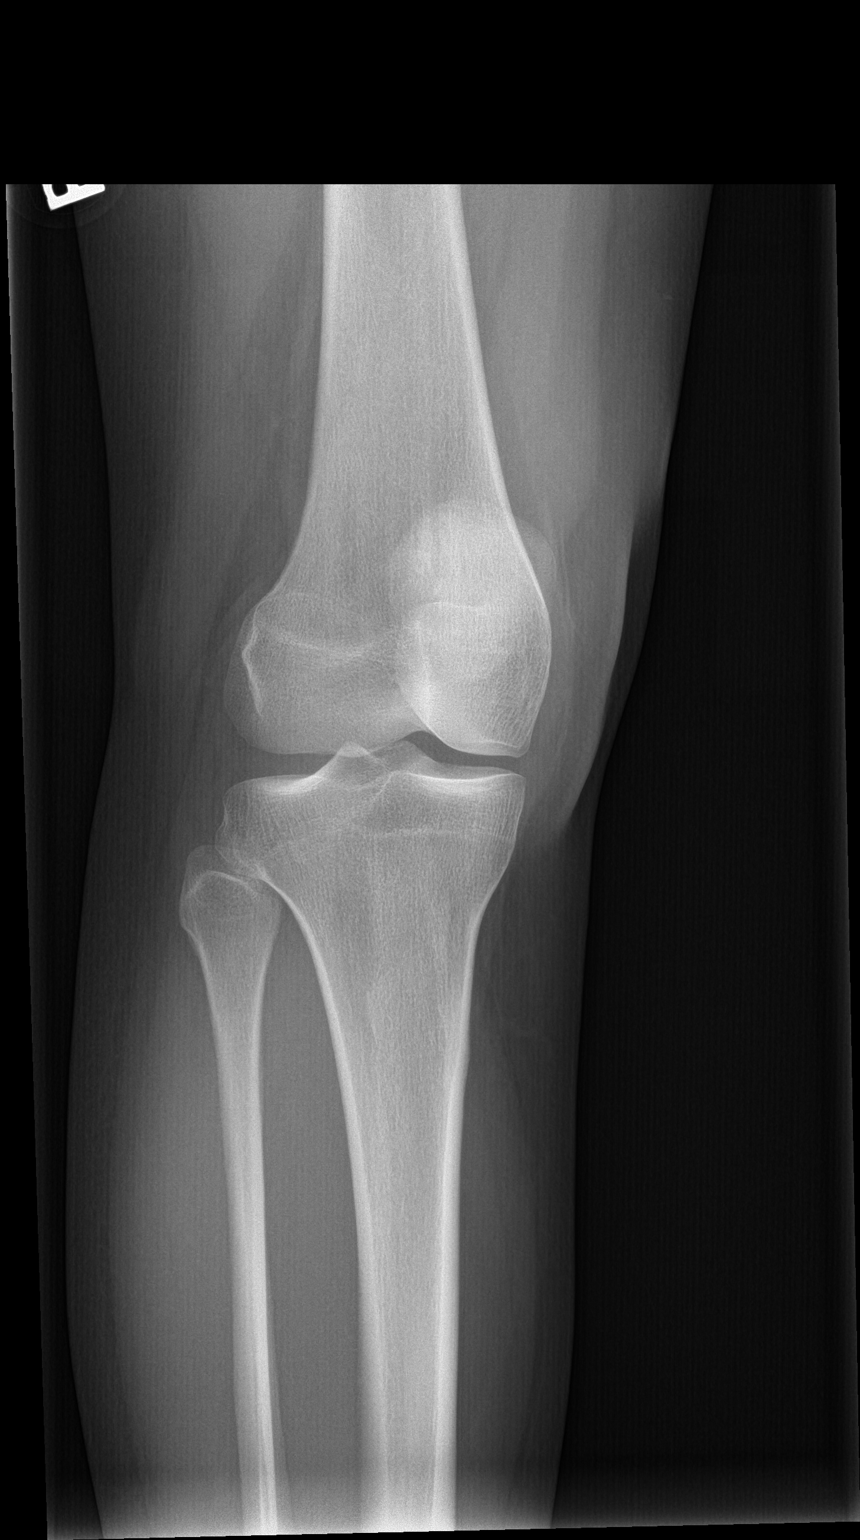

[knee lat]
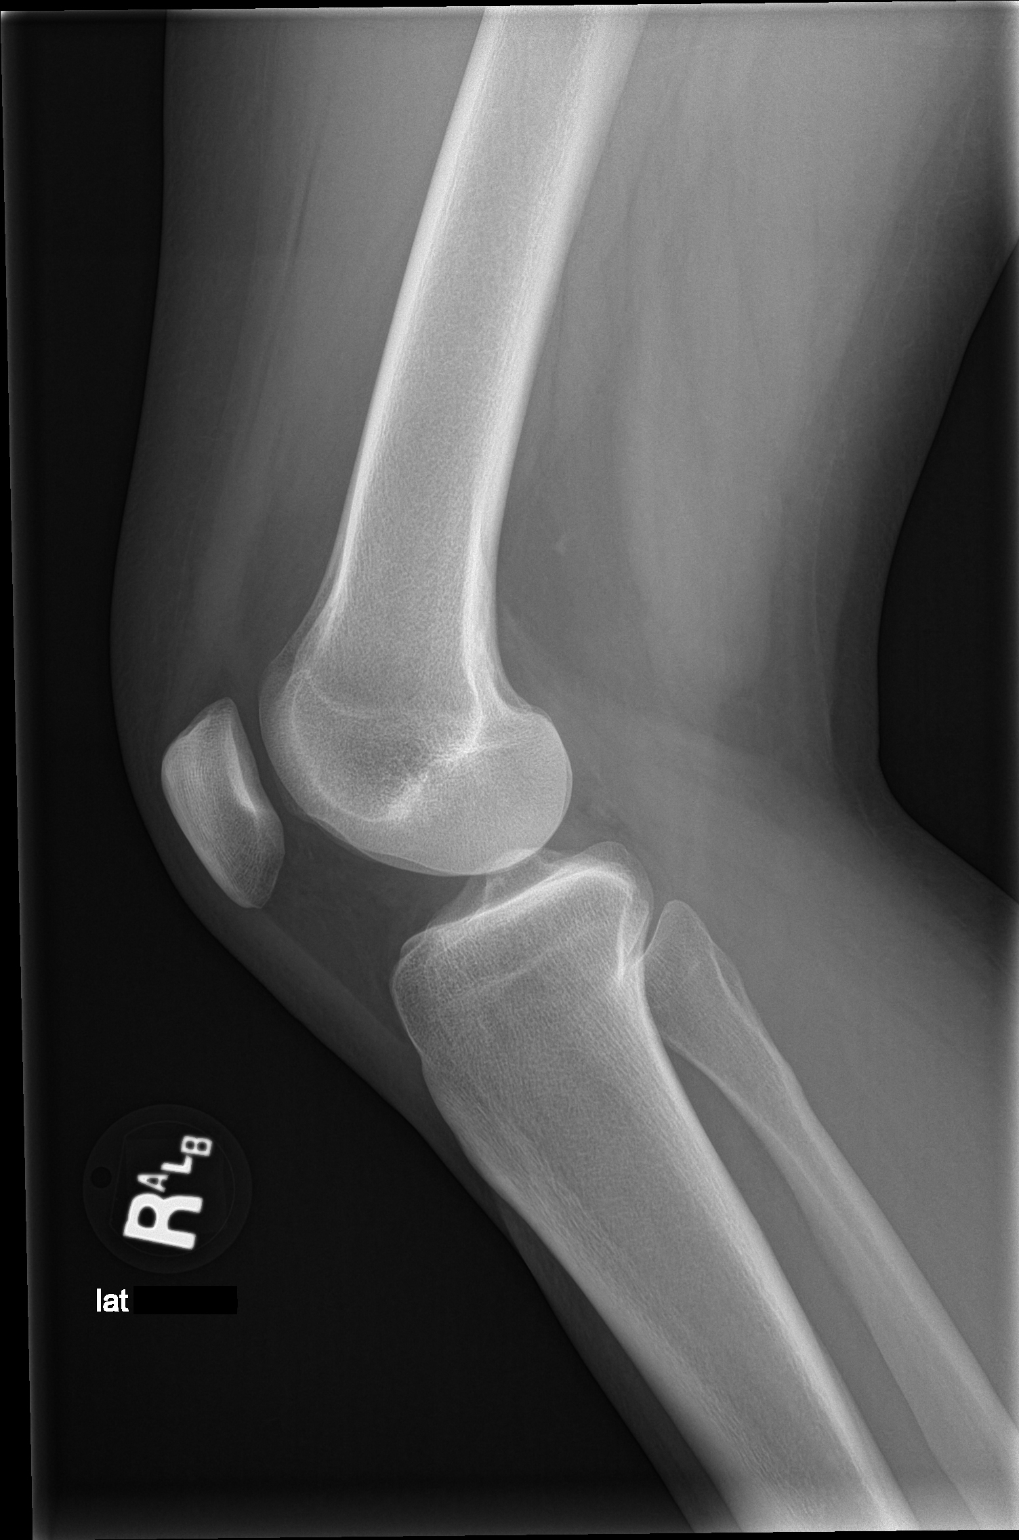

[tunnel]
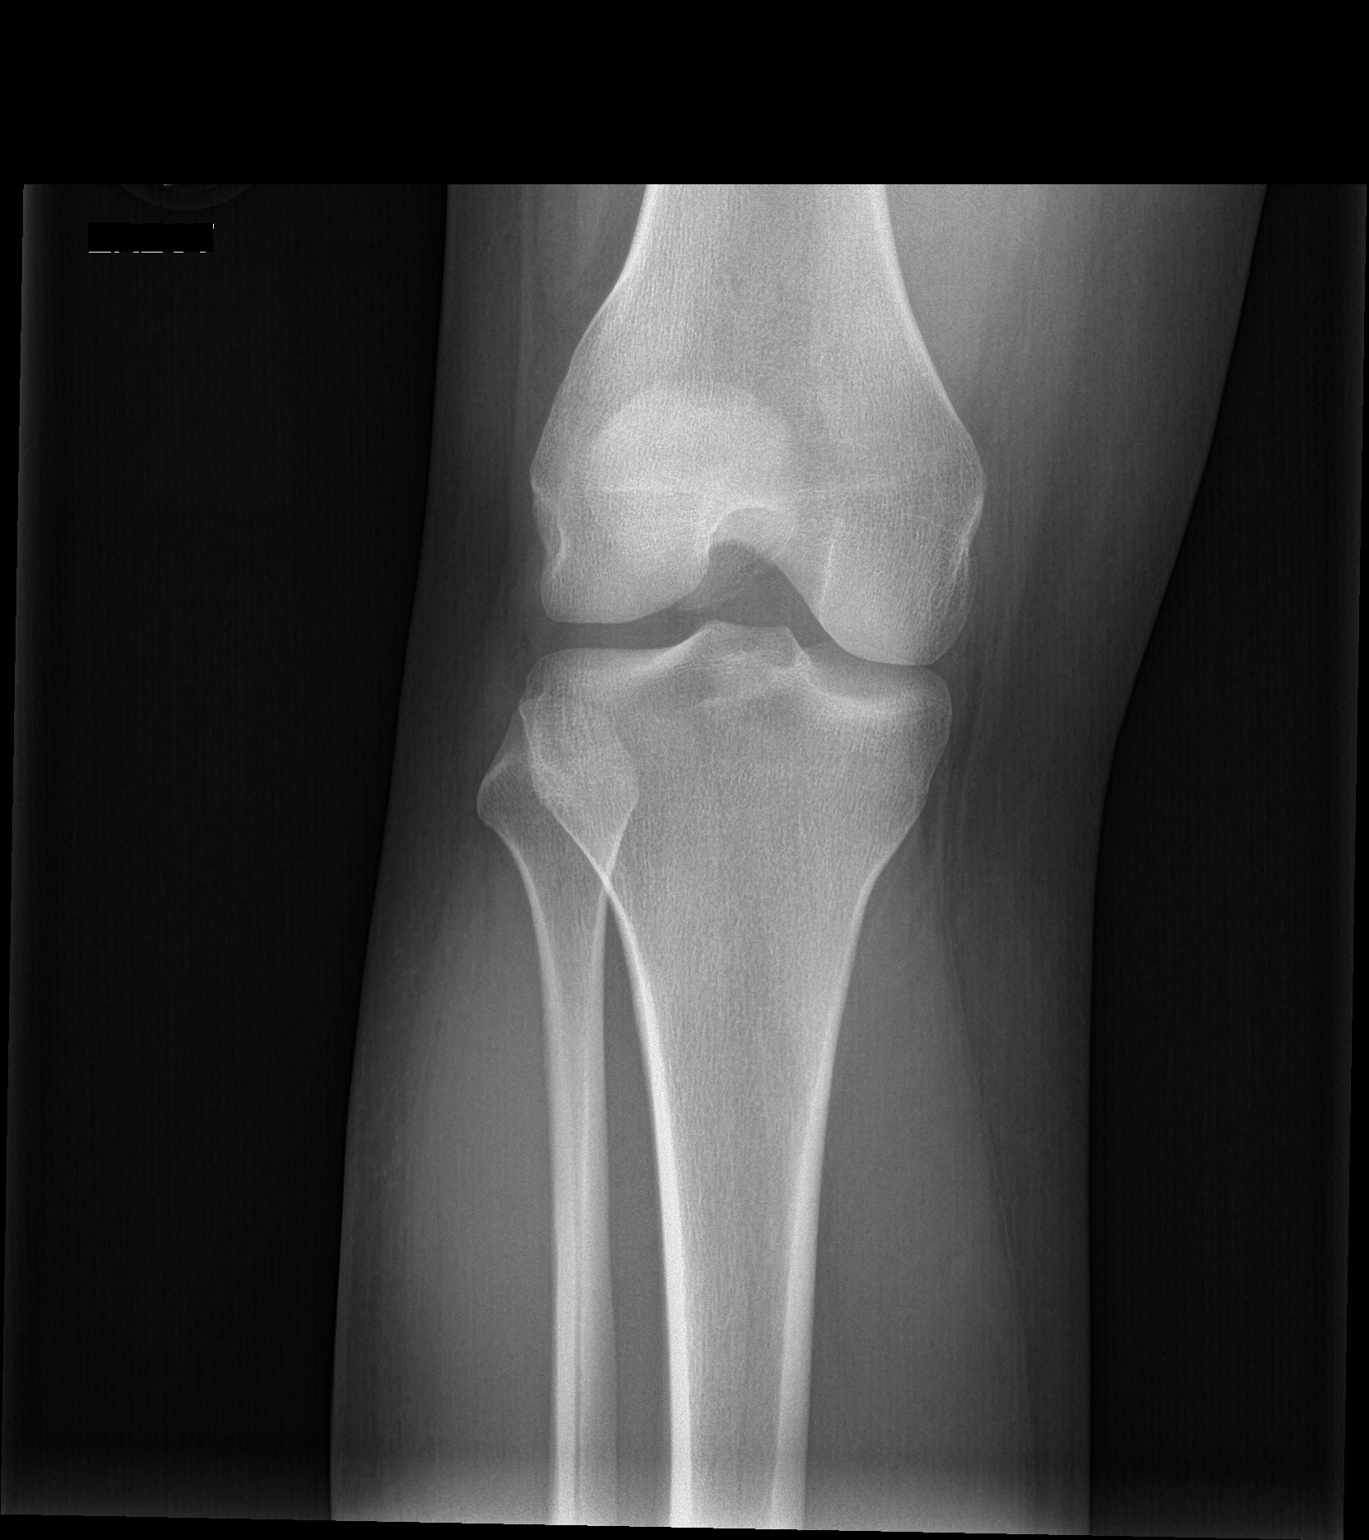

[patella skyline]
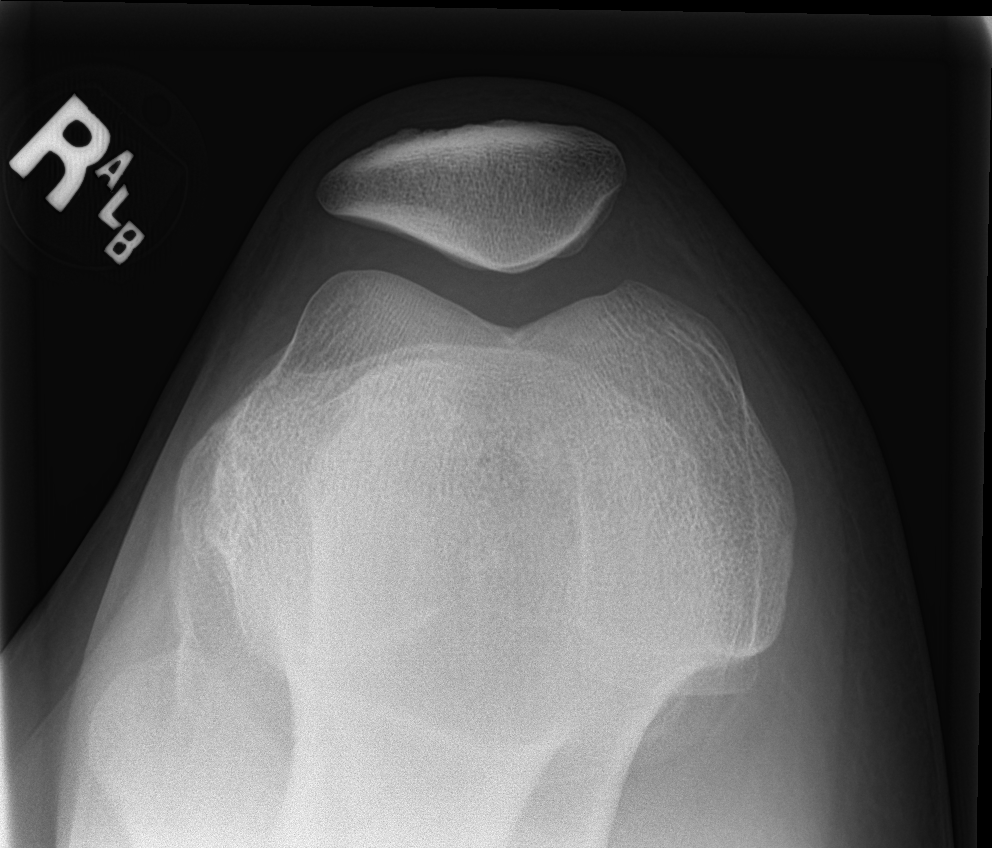

[4 of 4 positions shown; findings below may reference images not displayed]

FINDINGS: No evidence of fracture, dislocation, or joint effusion. No evidence
of arthropathy or other focal bone abnormality. Soft tissues are
unremarkable.
IMPRESSION: Negative.

## 2019-06-01 DIAGNOSIS — Z8614 Personal history of Methicillin resistant Staphylococcus aureus infection: Secondary | ICD-10-CM

## 2019-06-01 HISTORY — DX: Personal history of Methicillin resistant Staphylococcus aureus infection: Z86.14

## 2019-10-26 ENCOUNTER — Other Ambulatory Visit: Payer: Self-pay | Admitting: Neurology

## 2019-10-26 DIAGNOSIS — R569 Unspecified convulsions: Secondary | ICD-10-CM

## 2019-11-12 ENCOUNTER — Ambulatory Visit
Admission: RE | Admit: 2019-11-12 | Discharge: 2019-11-12 | Disposition: A | Discharge status: Home / Self Care | Payer: BC Managed Care – PPO | Source: Ambulatory Visit | Attending: Neurology | Admitting: Neurology

## 2019-11-12 ENCOUNTER — Other Ambulatory Visit: Payer: Self-pay

## 2019-11-12 DIAGNOSIS — R569 Unspecified convulsions: Secondary | ICD-10-CM | POA: Insufficient documentation

## 2019-11-12 MED ORDER — GADOBUTROL 1 MMOL/ML IV SOLN
8.0000 mL | Freq: Once | INTRAVENOUS | Status: AC | PRN
  Administered 2019-11-12: 8 mL via INTRAVENOUS

## 2019-11-22 ENCOUNTER — Other Ambulatory Visit: Payer: Self-pay

## 2019-11-22 ENCOUNTER — Emergency Department
Admission: EM | Admit: 2019-11-22 | Discharge: 2019-11-22 | Disposition: A | Discharge status: Home / Self Care | Payer: BC Managed Care – PPO | Attending: Emergency Medicine | Admitting: Emergency Medicine

## 2019-11-22 DIAGNOSIS — Z885 Allergy status to narcotic agent status: Secondary | ICD-10-CM | POA: Diagnosis not present

## 2019-11-22 DIAGNOSIS — Z888 Allergy status to other drugs, medicaments and biological substances status: Secondary | ICD-10-CM | POA: Insufficient documentation

## 2019-11-22 DIAGNOSIS — Z791 Long term (current) use of non-steroidal anti-inflammatories (NSAID): Secondary | ICD-10-CM | POA: Diagnosis not present

## 2019-11-22 DIAGNOSIS — Y999 Unspecified external cause status: Secondary | ICD-10-CM | POA: Diagnosis not present

## 2019-11-22 DIAGNOSIS — Y92009 Unspecified place in unspecified non-institutional (private) residence as the place of occurrence of the external cause: Secondary | ICD-10-CM | POA: Diagnosis not present

## 2019-11-22 DIAGNOSIS — Z79891 Long term (current) use of opiate analgesic: Secondary | ICD-10-CM | POA: Diagnosis not present

## 2019-11-22 DIAGNOSIS — Z87891 Personal history of nicotine dependence: Secondary | ICD-10-CM | POA: Insufficient documentation

## 2019-11-22 DIAGNOSIS — F1099 Alcohol use, unspecified with unspecified alcohol-induced disorder: Secondary | ICD-10-CM | POA: Diagnosis not present

## 2019-11-22 DIAGNOSIS — Z792 Long term (current) use of antibiotics: Secondary | ICD-10-CM | POA: Insufficient documentation

## 2019-11-22 DIAGNOSIS — Y93K9 Activity, other involving animal care: Secondary | ICD-10-CM | POA: Diagnosis not present

## 2019-11-22 DIAGNOSIS — Z880 Allergy status to penicillin: Secondary | ICD-10-CM | POA: Diagnosis not present

## 2019-11-22 DIAGNOSIS — S01312A Laceration without foreign body of left ear, initial encounter: Secondary | ICD-10-CM | POA: Diagnosis present

## 2019-11-22 DIAGNOSIS — W548XXA Other contact with dog, initial encounter: Secondary | ICD-10-CM | POA: Insufficient documentation

## 2019-11-22 DIAGNOSIS — Z91018 Allergy to other foods: Secondary | ICD-10-CM | POA: Insufficient documentation

## 2019-11-22 HISTORY — DX: Unspecified convulsions: R56.9

## 2019-11-22 MED ORDER — AMOXICILLIN-POT CLAVULANATE 875-125 MG PO TABS
1.0000 | ORAL_TABLET | Freq: Once | ORAL | Status: AC
  Administered 2019-11-22: 1 via ORAL
  Filled 2019-11-22: qty 1

## 2019-11-22 MED ORDER — AMOXICILLIN-POT CLAVULANATE 875-125 MG PO TABS
1.0000 | ORAL_TABLET | Freq: Two times a day (BID) | ORAL | 0 refills | Status: DC
Start: 2019-11-22 — End: 2021-10-23

## 2019-11-22 NOTE — ED Triage Notes (Signed)
Pt here with left ear lac after playing with her dog. Pt NAD.

## 2019-11-22 NOTE — ED Provider Notes (Signed)
[facility] Emergency Department Provider Note ____________________________________________  Time seen: 1900  I have reviewed the triage vital signs and the nursing notes.  HISTORY  Chief Complaint  Ear Laceration   HPI Debra Richmond is a 29 y.o. female presents to the ER today with complaint of laceration to her left ear.  She reports just prior to arrival.  She was playing with her pitbull that scratched her on her left ear.  She was able to control the bleeding at home with pressure.  She reports her dog is UTD on immunizations.  Her last tetanus was 01/2016.  Past Medical History:  Diagnosis Date  . Anxiety   . GERD (gastroesophageal reflux disease)   . PCOD (polycystic ovarian disease)   . Rotator cuff disorder    right shoulder  . Seizure [facility])     Patient Active Problem List   Diagnosis Date Noted  . Cutaneous abscess of left upper extremity 01/21/2016  . PCOS (polycystic ovarian syndrome) 01/14/2016  . Right shoulder strain 08/27/2015  . Right shoulder tendinitis 08/27/2015  . Gastro-esophageal reflux disease with esophagitis 08/14/2012    Past Surgical History:  Procedure Laterality Date  . CHOLECYSTECTOMY  10/19/2012  . WISDOM TOOTH EXTRACTION Bilateral 04/29/2017   upper    Prior to Admission medications   Medication Sig Start Date End Date Taking? Authorizing Provider  amoxicillin-clavulanate (AUGMENTIN) 875-125 MG tablet Take 1 tablet by mouth 2 (two) times daily. 11/22/19   Jearld Fenton, NP  cyclobenzaprine (FLEXERIL) 10 MG tablet Take 1 tablet (10 mg total) by mouth 3 (three) times daily as needed. 06/01/18   Sable Feil, PA-C  ibuprofen (ADVIL,MOTRIN) 600 MG tablet Take 1 tablet (600 mg total) by mouth every 8 (eight) hours as needed. 06/01/18   Sable Feil, PA-C  traMADol (ULTRAM) 50 MG tablet Take 1 tablet (50 mg total) by mouth every 12 (twelve) hours as needed. 03/20/18   Sable Feil, PA-C    Allergies Prednisone,  Dilaudid [hydromorphone hcl], Hydromorphone, Doxycycline, Garlic, Vancomycin, Clindamycin, and Sulfa antibiotics  Family History  Problem Relation Age of Onset  . Lupus Mother     Social History Social History   Tobacco Use  . Smoking status: Former Smoker    Packs/day: 1.00    Types: Cigarettes  . Smokeless tobacco: Never Used  Vaping Use  . Vaping Use: Every day  Substance Use Topics  . Alcohol use: Yes    Comment: rarely  . Drug use: No    Review of Systems  Constitutional: Negative for fever, chills or body aches. Cardiovascular: Negative for chest pain or chest tightness. Respiratory: Negative for cough or shortness of breath. Skin: Positive for laceration to left ear. Neurological: Negative for headaches,, dizziness focal weakness, tingling or numbness. ____________________________________________  PHYSICAL EXAM:  VITAL SIGNS: ED Triage Vitals  Enc Vitals Group     BP 11/22/19 1801 (!) 143/87     Pulse Rate 11/22/19 1801 81     Resp 11/22/19 1801 18     Temp 11/22/19 1801 98.8 F (37.1 C)     Temp Source 11/22/19 1801 Oral     SpO2 11/22/19 1801 100 %     Weight 11/22/19 1802 170 lb (77.1 kg)     Height 11/22/19 1802 5\' 3"  (1.6 m)     Head Circumference --      Peak Flow --      Pain Score 11/22/19 1802 4     Pain Loc --  Pain Edu? --      Excl. in GC? --     Constitutional: Alert and oriented. Well appearing and in no distress. Head: Normocephalic. Eyes: Conjunctivae are normal. PERRL. Normal extraocular movements Ears: Canals clear. TMs intact bilaterally. Cardiovascular: Normal rate, regular rhythm.  Respiratory: Normal respiratory effort. No wheezes/rales/rhonchi. Neurologic:  Normal gait without ataxia. Normal speech and language. No gross focal neurologic deficits are appreciated. Skin: 0.5 cm linear laceration of the left pinna.  Bleeding controlled.  INITIAL IMPRESSION / ASSESSMENT AND PLAN / ED COURSE  Laceration of Left External  Ear:  Superficial Wound cleansed thoroughly with hydrogen peroxide and normal saline Area closed with Benzoin and steri strips by this provider Augmentin 875-125 mg PO x 1 in ER RX for Augmentin 875-125 mg PO BID x 10 days Wound care instructions provided ____________________________________________  FINAL CLINICAL IMPRESSION(S) / ED DIAGNOSES  Final diagnoses:  Laceration of left external ear, initial encounter      Lorre Munroe, NP 11/22/19 1919    Concha Se, MD 11/22/19 Barry Brunner

## 2019-11-22 NOTE — Discharge Instructions (Addendum)
You were seen today for a laceration to your left ear.  This was closed with Steri-Strips.  You received your first dose of antibiotics in the ER.  Please take all antibiotics as prescribed.  Monitor for increased pain, redness, swelling or drainage.  Follow-up with your PCP if symptoms persist or worsen.

## 2021-03-13 ENCOUNTER — Other Ambulatory Visit: Payer: Self-pay | Admitting: Surgery

## 2021-03-13 DIAGNOSIS — M25562 Pain in left knee: Secondary | ICD-10-CM

## 2021-03-13 DIAGNOSIS — S83232A Complex tear of medial meniscus, current injury, left knee, initial encounter: Secondary | ICD-10-CM

## 2021-10-21 ENCOUNTER — Other Ambulatory Visit: Payer: Self-pay | Admitting: Surgery

## 2021-10-23 ENCOUNTER — Encounter
Admission: RE | Admit: 2021-10-23 | Discharge: 2021-10-23 | Disposition: A | Discharge status: Home / Self Care | Payer: 59 | Source: Ambulatory Visit | Attending: Surgery | Admitting: Surgery

## 2021-10-23 DIAGNOSIS — Z01818 Encounter for other preprocedural examination: Secondary | ICD-10-CM

## 2021-10-23 HISTORY — DX: Personal history of urinary calculi: Z87.442

## 2021-10-23 HISTORY — DX: Family history of other specified conditions: Z84.89

## 2021-10-23 HISTORY — DX: Claustrophobia: F40.240

## 2021-10-23 HISTORY — DX: Other complications of anesthesia, initial encounter: T88.59XA

## 2021-10-23 HISTORY — DX: Anemia, unspecified: D64.9

## 2021-10-23 NOTE — Patient Instructions (Signed)
Your procedure is scheduled on:10-27-21 Tuesday Report to the Registration Desk on the 1st floor of the [facility]. Then proceed to the 2nd floor Surgery Desk @ 6 am If your arrival time is 6:00 am, do not arrive prior to that time as the [facility] entrance doors do not open until 6:00 am.  REMEMBER: Instructions that are not followed completely may result in serious medical risk, up to and including death; or upon the discretion of your surgeon and anesthesiologist your surgery may need to be rescheduled.  Do not eat food after midnight the night before surgery.  No gum chewing, lozengers or hard candies.  You may however, drink CLEAR liquids up to 2 hours before you are scheduled to arrive for your surgery. Do not drink anything within 2 hours of your scheduled arrival time.  Clear liquids include: - water  - apple juice without pulp - gatorade (not RED colors) - black coffee or tea (Do NOT add milk or creamers to the coffee or tea) Do NOT drink anything that is not on this list.  Do NOT take any medication the day of surgery  One week prior to surgery: Stop Anti-inflammatories (NSAIDS) such as Advil, Aleve, Ibuprofen, Motrin, Naproxen, Naprosyn and Aspirin based products such as Excedrin, Goodys Powder, BC Powder. You may however, continue to take Tylenol if needed for pain up until the day of surgery.  No Alcohol for 24 hours before or after surgery.  No Smoking including e-cigarettes for 24 hours prior to surgery.  No chewable tobacco products for at least 6 hours prior to surgery.  No nicotine patches on the day of surgery.  Do not use any "recreational" drugs for at least a week prior to your surgery.  Please be advised that the combination of cocaine and anesthesia may have negative outcomes, up to and including death. If you test positive for cocaine, your surgery will be cancelled.  On the morning of surgery brush your teeth with toothpaste and water, you may rinse  your mouth with mouthwash if you wish. Do not swallow any toothpaste or mouthwash.  Do not wear jewelry, make-up, hairpins, clips or nail polish.  Do not wear lotions, powders, or perfumes.   Do not shave body from the neck down 48 hours prior to surgery just in case you cut yourself which could leave a site for infection.  Also, freshly shaved skin may become irritated if using the CHG soap.  Contact lenses, hearing aids and dentures may not be worn into surgery.  Do not bring valuables to the hospital. [facility] is not responsible for any missing/lost belongings or valuables.   Notify your doctor if there is any change in your medical condition (cold, fever, infection).  Wear comfortable clothing (specific to your surgery type) to the hospital.  After surgery, you can help prevent lung complications by doing breathing exercises.  Take deep breaths and cough every 1-2 hours. Your doctor may order a device called an Incentive Spirometer to help you take deep breaths. When coughing or sneezing, hold a pillow firmly against your incision with both hands. This is called "splinting." Doing this helps protect your incision. It also decreases belly discomfort.  If you are being admitted to the hospital overnight, leave your suitcase in the car. After surgery it may be brought to your room.  If you are being discharged the day of surgery, you will not be allowed to drive home. You will need a responsible adult (18 years  or older) to drive you home and stay with you that night.   If you are taking public transportation, you will need to have a responsible adult (18 years or older) with you. Please confirm with your physician that it is acceptable to use public transportation.   Please call the Pre-admissions Testing Dept. at [phone] if you have any questions about these instructions.  Surgery Visitation Policy:  Patients undergoing a surgery or procedure may have two family  members or support persons with them as long as the person is not COVID-19 positive or experiencing its symptoms.

## 2021-10-27 ENCOUNTER — Encounter: Payer: Self-pay | Admitting: Surgery

## 2021-10-27 ENCOUNTER — Other Ambulatory Visit: Payer: Self-pay

## 2021-10-27 ENCOUNTER — Ambulatory Visit: Payer: 59 | Admitting: Anesthesiology

## 2021-10-27 ENCOUNTER — Ambulatory Visit
Admission: RE | Admit: 2021-10-27 | Discharge: 2021-10-27 | Disposition: A | Discharge status: Home / Self Care | Payer: 59 | Attending: Surgery | Admitting: Surgery

## 2021-10-27 ENCOUNTER — Encounter
Admission: RE | Disposition: A | Discharge status: Home / Self Care | Payer: Self-pay | Source: Home / Self Care | Attending: Surgery

## 2021-10-27 DIAGNOSIS — Z01818 Encounter for other preprocedural examination: Secondary | ICD-10-CM

## 2021-10-27 DIAGNOSIS — S83412A Sprain of medial collateral ligament of left knee, initial encounter: Secondary | ICD-10-CM | POA: Insufficient documentation

## 2021-10-27 DIAGNOSIS — M6752 Plica syndrome, left knee: Secondary | ICD-10-CM | POA: Diagnosis not present

## 2021-10-27 DIAGNOSIS — X58XXXA Exposure to other specified factors, initial encounter: Secondary | ICD-10-CM | POA: Diagnosis not present

## 2021-10-27 HISTORY — PX: KNEE ARTHROSCOPY WITH ANTERIOR CRUCIATE LIGAMENT (ACL) REPAIR: SHX5644

## 2021-10-27 LAB — POCT PREGNANCY, URINE: Preg Test, Ur: NEGATIVE

## 2021-10-27 SURGERY — KNEE ARTHROSCOPY WITH ANTERIOR CRUCIATE LIGAMENT (ACL) REPAIR
Anesthesia: General | Site: Knee | Laterality: Left

## 2021-10-27 MED ORDER — OXYCODONE HCL 5 MG PO TABS
ORAL_TABLET | ORAL | Status: AC
  Filled 2021-10-27: qty 1

## 2021-10-27 MED ORDER — ONDANSETRON HCL 4 MG/2ML IJ SOLN
4.0000 mg | Freq: Once | INTRAMUSCULAR | Status: AC | PRN
  Administered 2021-10-27: 4 mg via INTRAVENOUS

## 2021-10-27 MED ORDER — MIDAZOLAM HCL 2 MG/2ML IJ SOLN
INTRAMUSCULAR | Status: DC | PRN
  Administered 2021-10-27: 2 mg via INTRAVENOUS

## 2021-10-27 MED ORDER — OXYCODONE HCL 5 MG PO TABS: 2.5000 mg | ORAL_TABLET | ORAL | 0 refills | Status: DC | PRN

## 2021-10-27 MED ORDER — CHLORHEXIDINE GLUCONATE 0.12 % MT SOLN: 15.0000 mL | Freq: Once | OROMUCOSAL | Status: AC

## 2021-10-27 MED ORDER — EPHEDRINE SULFATE (PRESSORS) 50 MG/ML IJ SOLN
INTRAMUSCULAR | Status: DC | PRN
  Administered 2021-10-27: 5 mg via INTRAVENOUS

## 2021-10-27 MED ORDER — FENTANYL CITRATE (PF) 100 MCG/2ML IJ SOLN
INTRAMUSCULAR | Status: DC | PRN
  Administered 2021-10-27 (×2): 50 ug via INTRAVENOUS

## 2021-10-27 MED ORDER — ACETAMINOPHEN 10 MG/ML IV SOLN
INTRAVENOUS | Status: AC
  Filled 2021-10-27: qty 100

## 2021-10-27 MED ORDER — LIDOCAINE HCL (PF) 1 % IJ SOLN
INTRAMUSCULAR | Status: AC
  Filled 2021-10-27: qty 30

## 2021-10-27 MED ORDER — KETOROLAC TROMETHAMINE 30 MG/ML IJ SOLN
INTRAMUSCULAR | Status: DC | PRN
  Administered 2021-10-27: 30 mg via INTRAVENOUS

## 2021-10-27 MED ORDER — ONDANSETRON HCL 4 MG/2ML IJ SOLN
INTRAMUSCULAR | Status: AC
  Filled 2021-10-27: qty 2

## 2021-10-27 MED ORDER — FENTANYL CITRATE (PF) 100 MCG/2ML IJ SOLN
INTRAMUSCULAR | Status: AC
  Filled 2021-10-27: qty 2

## 2021-10-27 MED ORDER — MIDAZOLAM HCL 2 MG/2ML IJ SOLN
INTRAMUSCULAR | Status: AC
  Filled 2021-10-27: qty 2

## 2021-10-27 MED ORDER — DEXMEDETOMIDINE HCL IN NACL 80 MCG/20ML IV SOLN
INTRAVENOUS | Status: AC
  Filled 2021-10-27: qty 20

## 2021-10-27 MED ORDER — CHLORHEXIDINE GLUCONATE 0.12 % MT SOLN
OROMUCOSAL | Status: AC
  Administered 2021-10-27: 15 mL via OROMUCOSAL
  Filled 2021-10-27: qty 15

## 2021-10-27 MED ORDER — PROPOFOL 10 MG/ML IV BOLUS
INTRAVENOUS | Status: DC | PRN
  Administered 2021-10-27: 200 mg via INTRAVENOUS

## 2021-10-27 MED ORDER — ORAL CARE MOUTH RINSE: 15.0000 mL | Freq: Once | OROMUCOSAL | Status: AC

## 2021-10-27 MED ORDER — FAMOTIDINE 20 MG PO TABS: 20.0000 mg | ORAL_TABLET | Freq: Once | ORAL | Status: AC

## 2021-10-27 MED ORDER — CEFAZOLIN SODIUM-DEXTROSE 2-4 GM/100ML-% IV SOLN
INTRAVENOUS | Status: AC
  Filled 2021-10-27: qty 100

## 2021-10-27 MED ORDER — KETOROLAC TROMETHAMINE 30 MG/ML IJ SOLN: 30.0000 mg | Freq: Once | INTRAMUSCULAR | Status: DC

## 2021-10-27 MED ORDER — FAMOTIDINE 20 MG PO TABS
ORAL_TABLET | ORAL | Status: AC
  Administered 2021-10-27: 20 mg via ORAL
  Filled 2021-10-27: qty 1

## 2021-10-27 MED ORDER — BUPIVACAINE-EPINEPHRINE (PF) 0.5% -1:200000 IJ SOLN
INTRAMUSCULAR | Status: AC
  Filled 2021-10-27: qty 60

## 2021-10-27 MED ORDER — RINGERS IRRIGATION IR SOLN
Status: DC | PRN
  Administered 2021-10-27: 1000 mL

## 2021-10-27 MED ORDER — BUPIVACAINE LIPOSOME 1.3 % IJ SUSP
INTRAMUSCULAR | Status: DC | PRN
  Administered 2021-10-27: 20 mL

## 2021-10-27 MED ORDER — LIDOCAINE HCL 1 % IJ SOLN
INTRAMUSCULAR | Status: DC | PRN
  Administered 2021-10-27: 30 mL

## 2021-10-27 MED ORDER — ACETAMINOPHEN 10 MG/ML IV SOLN
INTRAVENOUS | Status: DC | PRN
  Administered 2021-10-27: 1000 mg via INTRAVENOUS

## 2021-10-27 MED ORDER — FENTANYL CITRATE (PF) 100 MCG/2ML IJ SOLN
25.0000 ug | INTRAMUSCULAR | Status: DC | PRN
  Administered 2021-10-27 (×2): 50 ug via INTRAVENOUS

## 2021-10-27 MED ORDER — CEFAZOLIN SODIUM-DEXTROSE 2-4 GM/100ML-% IV SOLN
2.0000 g | INTRAVENOUS | Status: AC
  Administered 2021-10-27: 2 g via INTRAVENOUS

## 2021-10-27 MED ORDER — LIDOCAINE HCL (CARDIAC) PF 100 MG/5ML IV SOSY
PREFILLED_SYRINGE | INTRAVENOUS | Status: DC | PRN
  Administered 2021-10-27: 100 mg via INTRAVENOUS

## 2021-10-27 MED ORDER — GLYCOPYRROLATE 0.2 MG/ML IJ SOLN
INTRAMUSCULAR | Status: DC | PRN
  Administered 2021-10-27: .2 mg via INTRAVENOUS

## 2021-10-27 MED ORDER — ACETAMINOPHEN 10 MG/ML IV SOLN: 1000.0000 mg | Freq: Once | INTRAVENOUS | Status: DC | PRN

## 2021-10-27 MED ORDER — EPHEDRINE 5 MG/ML INJ
INTRAVENOUS | Status: AC
  Filled 2021-10-27: qty 5

## 2021-10-27 MED ORDER — ONDANSETRON HCL 4 MG/2ML IJ SOLN
INTRAMUSCULAR | Status: DC | PRN
  Administered 2021-10-27: 4 mg via INTRAVENOUS

## 2021-10-27 MED ORDER — OXYCODONE HCL 5 MG PO TABS
5.0000 mg | ORAL_TABLET | Freq: Once | ORAL | Status: AC | PRN
  Administered 2021-10-27: 5 mg via ORAL

## 2021-10-27 MED ORDER — KETOROLAC TROMETHAMINE 30 MG/ML IJ SOLN
INTRAMUSCULAR | Status: AC
  Filled 2021-10-27: qty 1

## 2021-10-27 MED ORDER — DEXMEDETOMIDINE HCL IN NACL 200 MCG/50ML IV SOLN
INTRAVENOUS | Status: DC | PRN
  Administered 2021-10-27 (×3): 4 ug via INTRAVENOUS
  Administered 2021-10-27: 8 ug via INTRAVENOUS
  Administered 2021-10-27: 4 ug via INTRAVENOUS

## 2021-10-27 MED ORDER — GLYCOPYRROLATE 0.2 MG/ML IJ SOLN
INTRAMUSCULAR | Status: AC
  Filled 2021-10-27: qty 1

## 2021-10-27 MED ORDER — OXYCODONE HCL 5 MG/5ML PO SOLN: 5.0000 mg | Freq: Once | ORAL | Status: AC | PRN

## 2021-10-27 MED ORDER — LIDOCAINE HCL (PF) 2 % IJ SOLN
INTRAMUSCULAR | Status: AC
  Filled 2021-10-27: qty 5

## 2021-10-27 MED ORDER — BUPIVACAINE-EPINEPHRINE (PF) 0.5% -1:200000 IJ SOLN
INTRAMUSCULAR | Status: DC | PRN
  Administered 2021-10-27: 30 mL via PERINEURAL
  Administered 2021-10-27: 20 mL via PERINEURAL

## 2021-10-27 MED ORDER — PROPOFOL 10 MG/ML IV BOLUS
INTRAVENOUS | Status: AC
  Filled 2021-10-27: qty 20

## 2021-10-27 MED ORDER — BUPIVACAINE LIPOSOME 1.3 % IJ SUSP
INTRAMUSCULAR | Status: AC
  Filled 2021-10-27: qty 20

## 2021-10-27 MED ORDER — LACTATED RINGERS IV SOLN: INTRAVENOUS | Status: DC

## 2021-10-27 MED ORDER — DEXAMETHASONE SODIUM PHOSPHATE 10 MG/ML IJ SOLN
INTRAMUSCULAR | Status: AC
  Filled 2021-10-27: qty 1

## 2021-10-27 MED ORDER — DEXAMETHASONE SODIUM PHOSPHATE 10 MG/ML IJ SOLN
INTRAMUSCULAR | Status: DC | PRN
  Administered 2021-10-27: 10 mg via INTRAVENOUS

## 2021-10-27 SURGICAL SUPPLY — 81 items
ADAPTER IRRIG TUBE 2 SPIKE SOL (ADAPTER) ×2 IMPLANT
ANCHOR JUGGERKNOT WTAP NDL 2.9 (Anchor) ×2 IMPLANT
BASIN GRAD PLASTIC 32OZ STRL (MISCELLANEOUS) ×2 IMPLANT
BIT DRILL 2.0 (BIT) ×1
BIT DRILL 2XNS DISP SS SM FRAG (BIT) ×1 IMPLANT
BIT DRILL JUGRKNT W/NDL BIT2.9 (DRILL) IMPLANT
BIT DRL 2XNS DISP SS SM FRAG (BIT) ×1
BLADE FULL RADIUS 3.5 (BLADE) ×2 IMPLANT
BLADE OSC/SAGITTAL MD 9X18.5 (BLADE) ×2 IMPLANT
BLADE SURG 15 STRL LF DISP TIS (BLADE) ×2 IMPLANT
BLADE SURG 15 STRL SS (BLADE) ×2
BLADE SURG SZ10 CARB STEEL (BLADE) ×4 IMPLANT
BNDG COHESIVE 4X5 TAN ST LF (GAUZE/BANDAGES/DRESSINGS) ×1 IMPLANT
BNDG COHESIVE 6X5 TAN ST LF (GAUZE/BANDAGES/DRESSINGS) ×2 IMPLANT
BNDG ELASTIC 6X5.8 VLCR STR LF (GAUZE/BANDAGES/DRESSINGS) ×2 IMPLANT
BNDG ESMARK 6X12 TAN STRL LF (GAUZE/BANDAGES/DRESSINGS) ×2 IMPLANT
BRACE KNEE POST OP SHORT (BRACE) IMPLANT
BUR 5.5 NOTCHBLASTER STR (BURR) ×1 IMPLANT
BUR ACROMIONIZER 4.0 (BURR) IMPLANT
BURR 5.5 NOTCHBLASTER STR (BURR)
CHLORAPREP W/TINT 26 (MISCELLANEOUS) ×3 IMPLANT
COOLER POLAR GLACIER W/PUMP (MISCELLANEOUS) ×1 IMPLANT
COVER BACK TABLE REUSABLE LG (DRAPES) ×2 IMPLANT
CUFF TOURN SGL QUICK 24 (TOURNIQUET CUFF)
CUFF TOURN SGL QUICK 34 (TOURNIQUET CUFF)
CUFF TRNQT CYL 24X4X16.5-23 (TOURNIQUET CUFF) IMPLANT
CUFF TRNQT CYL 34X4.125X (TOURNIQUET CUFF) IMPLANT
CUP MEDICINE 2OZ PLAST GRAD ST (MISCELLANEOUS) ×1 IMPLANT
DRAPE 3/4 80X56 (DRAPES) ×6 IMPLANT
DRAPE ARTHRO LIMB 89X125 STRL (DRAPES) ×2 IMPLANT
DRAPE IMP U-DRAPE 54X76 (DRAPES) ×2 IMPLANT
DRILL JUGGERKNOT W/NDL BIT 2.9 (DRILL) ×2
GAUZE SPONGE 4X4 12PLY STRL (GAUZE/BANDAGES/DRESSINGS) ×3 IMPLANT
GLOVE BIO SURGEON STRL SZ7.5 (GLOVE) ×8 IMPLANT
GLOVE BIO SURGEON STRL SZ8 (GLOVE) ×8 IMPLANT
GLOVE BIOGEL PI IND STRL 8 (GLOVE) ×1 IMPLANT
GLOVE BIOGEL PI INDICATOR 8 (GLOVE) ×1
GLOVE SURG UNDER LTX SZ8 (GLOVE) ×2 IMPLANT
GOWN STRL REUS W/ TWL LRG LVL3 (GOWN DISPOSABLE) ×1 IMPLANT
GOWN STRL REUS W/ TWL XL LVL3 (GOWN DISPOSABLE) ×1 IMPLANT
GOWN STRL REUS W/TWL LRG LVL3 (GOWN DISPOSABLE) ×1
GOWN STRL REUS W/TWL XL LVL3 (GOWN DISPOSABLE) ×1
HANDLE YANKAUER SUCT BULB TIP (MISCELLANEOUS) ×2 IMPLANT
IV LACTATED RINGER IRRG 3000ML (IV SOLUTION) ×1
IV LR IRRIG 3000ML ARTHROMATIC (IV SOLUTION) ×4 IMPLANT
KIT TURNOVER KIT A (KITS) ×2 IMPLANT
MANIFOLD NEPTUNE II (INSTRUMENTS) ×3 IMPLANT
MAT ABSORB  FLUID 56X50 GRAY (MISCELLANEOUS) ×1
MAT ABSORB FLUID 56X50 GRAY (MISCELLANEOUS) ×2 IMPLANT
NDL HYPO 21X1.5 SAFETY (NEEDLE) ×1 IMPLANT
NDL MAYO 6 CRC TAPER PT (NEEDLE) IMPLANT
NEEDLE HYPO 21X1.5 SAFETY (NEEDLE) ×2 IMPLANT
NEEDLE MAYO 6 CRC TAPER PT (NEEDLE) ×2 IMPLANT
NS IRRIG 500ML POUR BTL (IV SOLUTION) IMPLANT
PACK ARTHROSCOPY KNEE (MISCELLANEOUS) ×2 IMPLANT
PAD ABD DERMACEA PRESS 5X9 (GAUZE/BANDAGES/DRESSINGS) ×2 IMPLANT
PAD WRAPON POLAR KNEE (MISCELLANEOUS) ×1 IMPLANT
PENCIL SMOKE EVACUATOR (MISCELLANEOUS) ×2 IMPLANT
PUSHER KNOT ARTHRO 360DEG (MISCELLANEOUS) IMPLANT
SPONGE T-LAP 18X18 ~~LOC~~+RFID (SPONGE) ×4 IMPLANT
STAPLER SKIN PROX 35W (STAPLE) ×2 IMPLANT
STRAP SAFETY 5IN WIDE (MISCELLANEOUS) ×1 IMPLANT
STRIP CLOSURE SKIN 1/4X4 (GAUZE/BANDAGES/DRESSINGS) ×1 IMPLANT
SUT FIBERWIRE #2 38 BLUE 1/2 (SUTURE) ×2
SUT PROLENE 4 0 PS 2 18 (SUTURE) ×1 IMPLANT
SUT VIC AB 0 CT1 36 (SUTURE) ×2 IMPLANT
SUT VIC AB 2-0 CT1 27 (SUTURE) ×3
SUT VIC AB 2-0 CT1 TAPERPNT 27 (SUTURE) ×3 IMPLANT
SUT VIC AB 3-0 SH 27 (SUTURE) ×1
SUT VIC AB 3-0 SH 27X BRD (SUTURE) IMPLANT
SUTURE FIBERWR #2 38 BLUE 1/2 (SUTURE) ×4 IMPLANT
SYR 10ML LL (SYRINGE) IMPLANT
SYR 50ML LL SCALE MARK (SYRINGE) ×2 IMPLANT
SYR BULB IRRIG 60ML STRL (SYRINGE) ×2 IMPLANT
SYS INTERNAL BRACE KNEE (Miscellaneous) ×2 IMPLANT
SYSTEM INTERNAL BRACE KNEE (Miscellaneous) IMPLANT
TOWEL OR 17X26 4PK STRL BLUE (TOWEL DISPOSABLE) ×6 IMPLANT
TUBING INFLOW SET DBFLO PUMP (TUBING) ×2 IMPLANT
WAND WEREWOLF FLOW 90D (MISCELLANEOUS) ×2 IMPLANT
WATER STERILE IRR 500ML POUR (IV SOLUTION) ×2 IMPLANT
WRAPON POLAR PAD KNEE (MISCELLANEOUS) ×2

## 2021-10-27 NOTE — Anesthesia Procedure Notes (Signed)
Procedure Name: LMA Insertion Date/Time: 10/27/2021 7:58 AM Performed by: Cammie Sickle, CRNA Pre-anesthesia Checklist: Patient identified, Patient being monitored, Timeout performed, Emergency Drugs available and Suction available Patient Re-evaluated:Patient Re-evaluated prior to induction Oxygen Delivery Method: Circle system utilized Preoxygenation: Pre-oxygenation with 100% oxygen Induction Type: IV induction Ventilation: Mask ventilation without difficulty LMA: LMA inserted LMA Size: 4.0 Tube type: Oral Number of attempts: 1 Placement Confirmation: positive ETCO2 and breath sounds checked- equal and bilateral Tube secured with: Tape Dental Injury: Teeth and Oropharynx as per pre-operative assessment

## 2021-10-27 NOTE — Anesthesia Preprocedure Evaluation (Signed)
Anesthesia Evaluation  Patient identified by MRN, date of birth, ID band Patient awake    Reviewed: Allergy & Precautions, NPO status , Patient's Chart, lab work & pertinent test results  History of Anesthesia Complications Negative for: history of anesthetic complications  Airway Mallampati: I  TM Distance: >3 FB Neck ROM: Full    Dental no notable dental hx. (+) Teeth Intact   Pulmonary neg pulmonary ROS, neg sleep apnea, neg COPD, Patient abstained from smoking.Not current smoker, former smoker,    Pulmonary exam normal breath sounds clear to auscultation       Cardiovascular Exercise Tolerance: Good METS(-) hypertension(-) CAD and (-) Past MI negative cardio ROS  (-) dysrhythmias  Rhythm:Regular Rate:Normal - Systolic murmurs    Neuro/Psych PSYCHIATRIC DISORDERS Anxiety negative neurological ROS     GI/Hepatic GERD  Controlled,(+)     (-) substance abuse  ,   Endo/Other  neg diabetes  Renal/GU negative Renal ROS     Musculoskeletal   Abdominal   Peds  Hematology   Anesthesia Other Findings Past Medical History: No date: Anemia No date: Anxiety No date: Claustrophobia No date: Complication of anesthesia     Comment:  pt states she becomes very defiant after surgery No date: Family history of adverse reaction to anesthesia     Comment:  twin sister- n/v No date: GERD (gastroesophageal reflux disease) No date: History of kidney stones 2021: History of methicillin resistant staphylococcus aureus (MRSA) No date: PCOD (polycystic ovarian disease) No date: Rotator cuff disorder     Comment:  right shoulder No date: Seizure (HCC)     Comment:  pt denies  Reproductive/Obstetrics                             Anesthesia Physical Anesthesia Plan  ASA: 2  Anesthesia Plan: General   Post-op Pain Management: Ofirmev IV (intra-op)*, Toradol IV (intra-op)* and Regional block*    Induction: Intravenous  PONV Risk Score and Plan: 3 and Ondansetron, Dexamethasone and Midazolam  Airway Management Planned: LMA  Additional Equipment: None  Intra-op Plan:   Post-operative Plan: Extubation in OR  Informed Consent: I have reviewed the patients History and Physical, chart, labs and discussed the procedure including the risks, benefits and alternatives for the proposed anesthesia with the patient or authorized representative who has indicated his/her understanding and acceptance.     Dental advisory given  Plan Discussed with: CRNA and Surgeon  Anesthesia Plan Comments: (Discussed risks of anesthesia with patient, including PONV, sore throat, lip/dental/eye damage. Rare risks discussed as well, such as cardiorespiratory and neurological sequelae, and allergic reactions. Discussed the role of CRNA in patient's perioperative care. Patient understands.  Given that patient says she does poorly with oral opioids, I discussed r/b/a of adductor canal nerve block, including:  - bleeding, infection, nerve damage - poor or non functioning block. - reactions and toxicity to local anesthetic Patient prefers to see if she needs it in post-op, but understands the above. )        Anesthesia Quick Evaluation

## 2021-10-27 NOTE — Transfer of Care (Signed)
Immediate Anesthesia Transfer of Care Note  Patient: Debra Richmond  Procedure(s) Performed: LEFT KNEE ARTHROSCOPY WITH DEBRIDEMENT AND REPAIR/RECONSTRUCTION OF HER MEDIAL COLLATERAL LIGAMENT (Left: Knee)  Patient Location: PACU  Anesthesia Type:General  Level of Consciousness: drowsy  Airway & Oxygen Therapy: Patient Spontanous Breathing  Post-op Assessment: Report given to RN and Post -op Vital signs reviewed and stable  Post vital signs: Reviewed and stable  Last Vitals:  Vitals Value Taken Time  BP 113/68 10/27/21 1000  Temp 36.2 C 10/27/21 0947  Pulse 90 10/27/21 1002  Resp 12 10/27/21 1002  SpO2 100 % 10/27/21 1002  Vitals shown include unvalidated device data.  Last Pain:  Vitals:   10/27/21 1000  TempSrc:   PainSc: 0-No pain         Complications: No notable events documented.

## 2021-10-27 NOTE — H&P (Signed)
History of Present Illness:  Debra Richmond is a 31 y.o. female who presents for follow-up of her medial sided left knee pain secondary to an apparent chronic grade 2 strain of the medial collateral ligament by MRI scan. The patient notes little change in her symptoms since she was last evaluated 2 months ago. She continues to complain of medial sided knee pain which she rates at 3/10 on today's visit. She has been taking naproxen and applying ice as necessary with temporary partial relief of her symptoms. She also notes intermittent episodes of instability where she feels as though her knee will want to buckle on her. She continues to try to do exercises on her own at home and continues to ambulate with her knee brace for support with limited benefit. She notes that the aspiration/injection of the Baker's cyst performed by Dr. Landry MellowKubinski after her last visit did help with her posterior knee discomfort, but did not provide any benefit for her more medial sided symptoms. She denies any reinjury to the knee and denies any numbness or paresthesias down her leg to her foot.  Current Outpatient Medications:  NAPROXEN ORAL Take 500 mg by mouth once as needed   Allergies:   Clindamycin Other (Chest tighteness)   Dilaudid [Hydromorphone (Bulk)] Hives   Doxycycline Nausea and Vomiting   Garlic Hives   Prednisone Other (Disorientation)   Sulfa (Sulfonamide Antibiotics) Nausea and Vomiting   Past Medical History:   Abdominal pain (Intermittent & takes tramadol to help w/ this PRN)   Abnormal cytology 2016   Allergy   ANA positive 2019   Anxiety   Gastritis 03/26/2015   GERD (gastroesophageal reflux disease)   History of abnormal cervical Pap smear   Miscarriage 09/14, 01/15   MRSA (Multiple skin abscesses requiring I&D)   PCOS (polycystic ovarian syndrome)   Perirectal abscess 12/2015   Reflux esophagitis 08/14/12 (w/ focal area of intestinal metaplasia c/w Barrett's esophagus)   Right shoulder  tendinitis   Seborrheic dermatitis of scalp (Occipital scalp)   Torn rotator cuff   Vitamin D deficiency 08/2019 (Vit D= 23.7)   Past Surgical History:   COLONOSCOPY 08/14/12   CHOLECYSTECTOMY 10/19/12   EGD 03/26/2015 (Gastritis/Mild Esophagitis/No Repeat/MGR)   EGD 08/14/12, 08/03/13   wisdom teeth   Family History:   High blood pressure (Hypertension) Mother   Endometriosis Mother   Gallbladder disease Mother   Lupus Mother   Clotting disorder Mother   Myocardial Infarction (Heart attack) Father   High blood pressure (Hypertension) Sister   Hemochromatosis Sister   High blood pressure (Hypertension) Maternal Grandmother   Coronary Artery Disease (Maternal Grandmother)   Deep vein thrombosis (Maternal Grandmother)   Dementia Maternal Grandmother   Social History:   Socioeconomic History:   Marital status: Married  Tobacco Use   Smoking status: Former  Packs/day: 0.25  Years: 0.00  Pack years: 0.00  Types: Cigarettes   Smokeless tobacco: Never  Vaping Use   Vaping Use: Never used  Substance and Sexual Activity   Alcohol use: Yes  Alcohol/week: 0.0 standard drinks  Comment: Occassionally   Drug use: Never   Sexual activity: Yes  Partners: Female  Birth control/protection: None   Review of Systems:  A comprehensive 14 point ROS was performed, reviewed, and the pertinent orthopaedic findings are documented in the HPI.  Physical Exam: Vitals (10/19/21 16100812):  BP: 116/82  Weight: 82.9 kg (182 lb 12.8 oz)  Height: 162.6 cm (5\' 4" )  PainSc: 3  PainLoc: Knee  General/Constitutional: The patient appears to be well-nourished, well-developed, and in no acute distress. Neuro/Psych: Normal mood and affect, oriented to person, place and time.  Eyes: Non-icteric. Pupils are equal, round, and reactive to light, and exhibit synchronous movement. ENT: Unremarkable. Lymphatic: No palpable adenopathy. Respiratory: Lungs clear to auscultation, Normal chest excursion, No  wheezes and Non-labored breathing Cardiovascular: Regular rate and rhythm. No murmurs. and No edema, swelling or tenderness, except as noted in detailed exam. Integumentary: No impressive skin lesions present, except as noted in detailed exam. Musculoskeletal: Unremarkable, except as noted in detailed exam.  Left knee exam: GAIT: Essentially normal gait and does not use any assistive devices ALIGNMENT: Normal SKIN: Old well-healed laceration scar, otherwise unremarkable SWELLING: Absent EFFUSION: None WARMTH: None TENDERNESS: Mild focal tenderness along posteromedial joint line, more moderate focal tenderness to palpation over medial epicondyle ROM: 0-130 degrees with minimal discomfort in maximal flexion McMURRAY'S: Negative PATELLOFEMORAL: Normal tracking with no peri-patellar tenderness and negative apprehension sign CREPITUS: None LACHMAN'S: Negative PIVOT SHIFT: Negative ANTERIOR DRAWER: Negative POSTERIOR DRAWER: Negative VARUS/VALGUS: Stable and without pain.   She is neurovascularly intact to the left lower extremity and foot.  Assessment:  Grade 2 sprain of medial collateral ligament of left knee.   Synovial cyst of left popliteal space.   Plan: The treatment options were discussed with the patient. In addition, patient educational materials were provided regarding the diagnosis and treatment options. The patient is quite frustrated by her persistent symptoms and functional limitations, and is ready to consider more aggressive treatment options. Therefore, I have recommended a surgical procedure, specifically a left knee arthroscopy with debridement and repair/reconstruction of the medial collateral ligament. The procedure was discussed with the patient, as were the potential risks (including bleeding, infection, nerve and/or blood vessel injury, persistent or recurrent pain, failure of the repair, progression of arthritis, need for further surgery, blood clots, strokes, heart  attacks and/or arhythmias, pneumonia, etc.) and benefits. The patient states her understanding and wishes to proceed. All of the patient's questions and concerns were answered. She can call any time with further concerns. She will follow up post-surgery, routine.   Socioeconomic History   Marital status: Married  Tobacco Use   Smoking status: Former  Packs/day: 0.25  Years: 0.00  Pack years: 0.00  Types: Cigarettes   Smokeless tobacco: Never  Vaping Use   Vaping Use: Never used  Substance and Sexual Activity   Alcohol use: Yes  Alcohol/week: 0.0 standard drinks  Comment: Occassionally   Drug use: Never   Sexual activity: Yes  Partners: Female  Birth control/protection: None   Review of Systems:  A comprehensive 14 point ROS was performed, reviewed, and the pertinent orthopaedic findings are documented in the HPI.  Physical Exam: Vitals:  BP: 116/82  Weight: 82.9 kg (182 lb 12.8 oz)  Height: 162.6 cm (5\' 4" )  PainSc: 3  PainLoc: Knee   General/Constitutional: The patient appears to be well-nourished, well-developed, and in no acute distress. Neuro/Psych: Normal mood and affect, oriented to person, place and time.  Eyes: Non-icteric. Pupils are equal, round, and reactive to light, and exhibit synchronous movement. ENT: Unremarkable. Lymphatic: No palpable adenopathy. Respiratory: Lungs clear to auscultation, Normal chest excursion, No wheezes and Non-labored breathing Cardiovascular: Regular rate and rhythm. No murmurs. and No edema, swelling or tenderness, except as noted in detailed exam. Integumentary: No impressive skin lesions present, except as noted in detailed exam. Musculoskeletal: Unremarkable, except as noted in detailed exam.  Left knee exam: GAIT: Essentially normal  gait and does not use any assistive devices ALIGNMENT: Normal SKIN: Old well-healed laceration scar, otherwise unremarkable SWELLING: Absent EFFUSION: None WARMTH: None TENDERNESS: Mild focal  tenderness along posteromedial joint line, more moderate focal tenderness to palpation over medial epicondyle ROM: 0-130 degrees with minimal discomfort in maximal flexion McMURRAY'S: Negative PATELLOFEMORAL: Normal tracking with no peri-patellar tenderness and negative apprehension sign CREPITUS: None LACHMAN'S: Negative PIVOT SHIFT: Negative ANTERIOR DRAWER: Negative POSTERIOR DRAWER: Negative VARUS/VALGUS: Stable and without pain.   She is neurovascularly intact to the left lower extremity and foot.  Assessment:  Grade 2 sprain of medial collateral ligament of left knee.   Synovial cyst of left popliteal space.   Plan: The treatment options were discussed with the patient. In addition, patient educational materials were provided regarding the diagnosis and treatment options. The patient is quite frustrated by her persistent symptoms and functional limitations, and is ready to consider more aggressive treatment options. Therefore, I have recommended a surgical procedure, specifically a left knee arthroscopy with debridement and repair/reconstruction of the medial collateral ligament. The procedure was discussed with the patient, as were the potential risks (including bleeding, infection, nerve and/or blood vessel injury, persistent or recurrent pain, failure of the repair, progression of arthritis, need for further surgery, blood clots, strokes, heart attacks and/or arhythmias, pneumonia, etc.) and benefits. The patient states her understanding and wishes to proceed. All of the patient's questions and concerns were answered. She can call any time with further concerns. She will follow up post-surgery, routine.    H&P reviewed and patient re-examined. No changes.

## 2021-10-27 NOTE — Op Note (Signed)
10/27/2021  9:58 AM  Patient:   Debra Richmond  Pre-Op Diagnosis:   Grade 2 sprain of medial collateral ligament of left knee.  Postoperative diagnosis:   Grade 2 MCL sprain with symptomatic plica, left knee.  Procedure:   Arthroscopic debridement of symptomatic medial plica, open repair of MCL with internal brace augmentation, left knee.  Surgeon:   Pascal Lux, MD  Assistant:   Cameron Proud, PA-C  Anesthesia:   General LMA  Findings:   As above.  The articular surfaces of the femur, tibia, and patella all were in excellent condition, as were the medial and lateral menisci.  The anterior and posterior crucial ligaments also in excellent condition.  Complications:   None  EBL:   3 cc.  Total fluids:   550 cc of crystalloid.  Tourniquet time:   70 minutes at 300 mmHg  Drains:   None  Closure:   4-0 Prolene interrupted sutures for the portals and 3-0 Vicryl subcuticular sutures for the medial incisions.  Brief clinical note:   The patient is a 31 year old female who sustained the above-noted injury nearly 1 year ago. Despite extensive nonoperative treatment, the patient continues to experience medial sided knee pain with episodes of instability.  Her history and examination are suspicious for failure of the partial femoral sided MCL tear to heal properly. The patient presents at this time for arthroscopy, debridement, and repair versus reconstruction of the medial collateral ligament of the left knee.  Procedure:   The patient was brought into the operating room and lain in the supine position. After adequate general laryngeal mask anesthesia was obtained, a timeout was performed to verify the appropriate side. The patient's left knee was examined.  There was no obvious laxity to valgus stressing of the knee either at 0 or 30 degrees of flexion. No other instabilities were identified either. The knee was injected sterilely using a solution of 30 cc of 1% lidocaine and 30 cc of 0.5%  Sensorcaine with epinephrine. The left lower extremity was prepped with ChloraPrep solution before being draped sterilely. Preoperative antibiotics were administered. A repeat timeout was performed to verify the appropriate surgical site before the limb was exsanguinated with an Esmarch and the tourniquet inflated to 300 mmHg.   The expected portal sites were injected with 0.5% Sensorcaine with epinephrine before the camera was placed in the anterolateral portal and instrumentation performed through the anteromedial portal. The knee was sequentially examined beginning in the suprapatellar pouch, then progressing to the patellofemoral space, the medial gutter and compartment, the notch, and finally the lateral compartment and gutter. The findings were as described above. Abundant reactive synovial tissues anteriorly were debrided using the full-radius resector in order to improve visualization. The medial and lateral menisci were probed and found to be intact, as were the anterior posterior cruciate ligaments. The instruments were removed from the joint after suctioning the excess fluid.   An approximately 5 to 6 cm incision was made over the medial aspect of the knee centered over the medial epicondyle. The incision was carried down through the subcutaneous tissues to expose the fascia overlying the medial aspect of the knee. The medial epicondyle and abductor tubercle were identified by palpation, as was the medial collateral ligament. Two Biomet 2.9 mm JuggerKnot anchors were inserted, one just anterior and the other just posterior to the proximal end of the medial collateral ligament. After scuffing the medial epicondylar region with a key elevator to stimulate healing, one suture from each of  the anchors were passed through the proximal end of the medial collateral ligament and tied securely to effect of the repair.   Next, a 2 mm Arthrex internal brace was placed utilizing a 4.75 millimeter anchor placed  just proximal and posterior to the medial epicondyle. A second anchor was placed near the posterior edge of the proximal tibia 6 cm below the joint line. The second anchor was placed with the knee in mild varus and at approximately 20 degrees of flexion to tension the internal brace optimally. Subsequent gentle valgus stressing of the knee demonstrated excellent stability.  The medial knee wounds were copiously irrigated with sterile saline solution before being closed using 2-0 Vicryl interrupted sutures for the subcutaneous tissues and 3-0 Vicryl subcuticular sutures. Benzoin and Steri-Strips were applied to the skin. The portal sites were closed using 4-0 Prolene interrupted sutures. A sterile bulky dressing was applied to the knee before the patient was placed into a hinged range of motion knee brace with hinges set at 0 to 90 degrees but locked in extension. The patient was awakened, extubated, and returned to the recovery room in satisfactory condition after tolerating the procedure well. Of note, a total of 20 cc of Exparel was injected into the medial wounds prior to closure to help with postoperative analgesia.

## 2021-10-27 NOTE — Anesthesia Postprocedure Evaluation (Signed)
Anesthesia Post Note  Patient: Debra Richmond  Procedure(s) Performed: LEFT KNEE ARTHROSCOPY WITH DEBRIDEMENT AND REPAIR/RECONSTRUCTION OF HER MEDIAL COLLATERAL LIGAMENT (Left: Knee)  Patient location during evaluation: PACU Anesthesia Type: General Level of consciousness: awake and alert Pain management: pain level controlled Vital Signs Assessment: post-procedure vital signs reviewed and stable Respiratory status: spontaneous breathing, nonlabored ventilation, respiratory function stable and patient connected to nasal cannula oxygen Cardiovascular status: blood pressure returned to baseline and stable Postop Assessment: no apparent nausea or vomiting Anesthetic complications: no   No notable events documented.   Last Vitals:  Vitals:   10/27/21 1100 10/27/21 1113  BP: 115/75 108/62  Pulse: 81 61  Resp: 13 18  Temp: 36.6 C (!) 36.1 C  SpO2: 99% 98%    Last Pain:  Vitals:   10/27/21 1113  TempSrc: Temporal  PainSc: 0-No pain                 Corinda Gubler

## 2021-10-27 NOTE — Discharge Instructions (Addendum)
Orthopedic discharge instructions: Keep dressing dry and intact.  May shower after dressing changed on post-op day #4 (Saturday).  Cover staples with Band-Aids or Ace wrap after drying off, then reapply knee brace. Apply ice frequently to knee. Take naproxen 500 mg BID, Aleve 2 tablets twice daily, OR ibuprofen 600 to 800 mg 3 times daily with meals for 7-10 days, then as necessary. Take oxycodone as prescribed when needed.  May supplement with ES Tylenol if necessary. May weight-bear as tolerated so long as in brace locked in extension - use crutches as needed. Follow-up in 10-14 days or as scheduled.  AMBULATORY SURGERY  DISCHARGE INSTRUCTIONS   The drugs that you were given will stay in your system until tomorrow so for the next 24 hours you should not:  Drive an automobile Make any legal decisions Drink any alcoholic beverage   You may resume regular meals tomorrow.  Today it is better to start with liquids and gradually work up to solid foods.  You may eat anything you prefer, but it is better to start with liquids, then soup and crackers, and gradually work up to solid foods.   Please notify your doctor immediately if you have any unusual bleeding, trouble breathing, redness and pain at the surgery site, drainage, fever, or pain not relieved by medication.    Additional Instructions:        Please contact your physician with any problems or Same Day Surgery at [phone], Monday through Friday 6 am to 4 pm, or [place] at [facility] number at [phone].

## 2021-10-28 ENCOUNTER — Encounter: Payer: Self-pay | Admitting: Surgery

## 2022-05-17 ENCOUNTER — Other Ambulatory Visit: Payer: Self-pay | Admitting: Surgery

## 2022-05-28 ENCOUNTER — Encounter
Admission: RE | Admit: 2022-05-28 | Discharge: 2022-05-28 | Disposition: A | Discharge status: Home / Self Care | Payer: Self-pay | Source: Ambulatory Visit | Attending: Surgery | Admitting: Surgery

## 2022-05-28 VITALS — Ht 64.0 in | Wt 175.0 lb

## 2022-05-28 DIAGNOSIS — Z01812 Encounter for preprocedural laboratory examination: Secondary | ICD-10-CM

## 2022-05-28 HISTORY — DX: Nausea with vomiting, unspecified: R11.2

## 2022-05-28 HISTORY — DX: Other specified postprocedural states: Z98.890

## 2022-05-28 NOTE — Patient Instructions (Addendum)
Your procedure is scheduled on: Wednesday June 02, 2022. Report to Day Surgery inside [facility] 2nd floor, stop by registration desk before getting on elevator.  To find out your arrival time please call [phone] between 1PM - 3PM on Tuesday June 01, 2022.  Remember: Instructions that are not followed completely may result in serious medical risk,  up to and including death, or upon the discretion of your surgeon and anesthesiologist your  surgery may need to be rescheduled.     _X__ 1. Do not eat food after midnight the night before your procedure.                 No chewing gum or hard candies. You may drink clear liquids up to 2 hours                 before you are scheduled to arrive for your surgery- DO not drink clear                 liquids within 2 hours of the start of your surgery.                 Clear Liquids include:  water, apple juice without pulp, clear Gatorade, G2 or                  Gatorade Zero (avoid Red/Purple/Blue), Black Coffee or Tea (Do not add                 anything to coffee or tea).  __X__2. Complete the "Ensure Clear Pre-surgery Clear Carbohydrate Drink" provided to you, 2 hours before arrival. **If you are diabetic you will be provided with an alternative drink, Gatorade Zero or G2.  __X__3.  On the morning of surgery brush your teeth with toothpaste and water, you                may rinse your mouth with mouthwash if you wish.  Do not swallow any toothpaste or mouthwash.     _X__ 4.  No Alcohol for 24 hours before or after surgery.   _X__ 5.  Do Not Smoke or use e-cigarettes For 24 Hours Prior to Your Surgery.                 Do not use any chewable tobacco products for at least 6 hours prior to                 Surgery.  _X__  6.  Do not use any recreational drugs (marijuana, cocaine, heroin, ecstasy, MDMA or other)                For at least one week prior to your surgery.  Combination of these drugs with  anesthesia                May have life threatening results.  ____  7.  Bring all medications with you on the day of surgery if instructed.   __X__ 8.  Notify your doctor if there is any change in your medical condition      (cold, fever, infections).     Do not wear jewelry, make-up, hairpins, clips or nail polish. Do not wear lotions, powders, or perfumes. You may wear deodorant. Do not shave 48 hours prior to surgery. Men may shave face and neck. Do not bring valuables to the hospital.    [facility] is not responsible for any belongings or valuables.  Contacts, dentures  or bridgework may not be worn into surgery. Leave your suitcase in the car. After surgery it may be brought to your room. For patients admitted to the hospital, discharge time is determined by your treatment team.   Patients discharged the day of surgery will not be allowed to drive home.   Make arrangements for someone to be with you for the first 24 hours of your Same Day Discharge.   __X__ Take these medicines the morning of surgery with A SIP OF WATER:    1. None   2.   3.   4.  5.  6.  ____ Fleet Enema (as directed)   __X__ Use CHG Soap (or wipes) as directed  ____ Use Benzoyl Peroxide Gel as instructed  ____ Use inhalers on the day of surgery  ____ Stop metformin 2 days prior to surgery    ____ Take 1/2 of usual insulin dose the night before surgery. No insulin the morning          of surgery.   ____ Call your PCP, cardiologist, or Pulmonologist if taking Coumadin/Plavix/aspirin and ask when to stop before your surgery.   __X__ One Week prior to surgery- Stop Anti-inflammatories such as Ibuprofen, Aleve, Advil, Motrin, meloxicam (MOBIC), diclofenac, etodolac, ketorolac, Toradol, Daypro, piroxicam, Goody's or BC powders. OK TO USE TYLENOL IF NEEDED   __X__ Stop supplements until after surgery.    ____ Bring C-Pap to the hospital.    If you have any questions regarding your pre-procedure  instructions,  Please call Pre-admit Testing at [phone]    Preparing for Surgery with CHLORHEXIDINE GLUCONATE (CHG) Soap  Chlorhexidine Gluconate (CHG) Soap  o An antiseptic cleaner that kills germs and bonds with the skin to continue killing germs even after washing  o Used for showering the night before surgery and morning of surgery  Before surgery, you can play an important role by reducing the number of germs on your skin.  CHG (Chlorhexidine gluconate) soap is an antiseptic cleanser which kills germs and bonds with the skin to continue killing germs even after washing.  Please do not use if you have an allergy to CHG or antibacterial soaps. If your skin becomes reddened/irritated stop using the CHG.  1. Shower the NIGHT BEFORE SURGERY and the MORNING OF SURGERY with CHG soap.  2. If you choose to wash your hair, wash your hair first as usual with your normal shampoo.  3. After shampooing, rinse your hair and body thoroughly to remove the shampoo.  4. Use CHG as you would any other liquid soap. You can apply CHG directly to the skin and wash gently with a scrungie or a clean washcloth.  5. Apply the CHG soap to your body only from the neck down. Do not use on open wounds or open sores. Avoid contact with your eyes, ears, mouth, and genitals (private parts). Wash face and genitals (private parts) with your normal soap.  6. Wash thoroughly, paying special attention to the area where your surgery will be performed.  7. Thoroughly rinse your body with warm water.  8. Do not shower/wash with your normal soap after using and rinsing off the CHG soap.  9. Pat yourself dry with a clean towel.  10. Wear clean pajamas to bed the night before surgery.  12. Place clean sheets on your bed the night of your first shower and do not sleep with pets.  13. Shower again with the CHG soap on the day of surgery prior to  arriving at the hospital.  14. Do not apply any  deodorants/lotions/powders.  15. Please wear clean clothes to the hospital

## 2022-06-01 MED ORDER — CEFAZOLIN SODIUM-DEXTROSE 2-4 GM/100ML-% IV SOLN
2.0000 g | INTRAVENOUS | Status: AC
  Administered 2022-06-02: 2 g via INTRAVENOUS

## 2022-06-01 MED ORDER — LACTATED RINGERS IV SOLN: INTRAVENOUS | Status: DC

## 2022-06-01 MED ORDER — CHLORHEXIDINE GLUCONATE 0.12 % MT SOLN: 15.0000 mL | Freq: Once | OROMUCOSAL | Status: AC

## 2022-06-01 MED ORDER — ORAL CARE MOUTH RINSE: 15.0000 mL | Freq: Once | OROMUCOSAL | Status: AC

## 2022-06-01 MED ORDER — FAMOTIDINE 20 MG PO TABS: 20.0000 mg | ORAL_TABLET | Freq: Once | ORAL | Status: AC

## 2022-06-02 ENCOUNTER — Ambulatory Visit: Payer: BC Managed Care – PPO

## 2022-06-02 ENCOUNTER — Encounter
Admission: RE | Disposition: A | Discharge status: Home / Self Care | Payer: Self-pay | Source: Home / Self Care | Attending: Surgery

## 2022-06-02 ENCOUNTER — Other Ambulatory Visit: Payer: Self-pay

## 2022-06-02 ENCOUNTER — Ambulatory Visit: Payer: BC Managed Care – PPO | Admitting: Anesthesiology

## 2022-06-02 ENCOUNTER — Ambulatory Visit
Admission: RE | Admit: 2022-06-02 | Discharge: 2022-06-02 | Disposition: A | Discharge status: Home / Self Care | Payer: BC Managed Care – PPO | Attending: Surgery | Admitting: Surgery

## 2022-06-02 ENCOUNTER — Encounter: Payer: Self-pay | Admitting: Surgery

## 2022-06-02 DIAGNOSIS — Z01812 Encounter for preprocedural laboratory examination: Secondary | ICD-10-CM

## 2022-06-02 DIAGNOSIS — T8484XA Pain due to internal orthopedic prosthetic devices, implants and grafts, initial encounter: Secondary | ICD-10-CM | POA: Insufficient documentation

## 2022-06-02 DIAGNOSIS — Z87891 Personal history of nicotine dependence: Secondary | ICD-10-CM | POA: Diagnosis not present

## 2022-06-02 DIAGNOSIS — Y831 Surgical operation with implant of artificial internal device as the cause of abnormal reaction of the patient, or of later complication, without mention of misadventure at the time of the procedure: Secondary | ICD-10-CM | POA: Insufficient documentation

## 2022-06-02 HISTORY — PX: HARDWARE REMOVAL: SHX979

## 2022-06-02 LAB — POCT PREGNANCY, URINE: Preg Test, Ur: NEGATIVE

## 2022-06-02 SURGERY — REMOVAL, HARDWARE
Anesthesia: General | Site: Knee | Laterality: Left

## 2022-06-02 MED ORDER — METOCLOPRAMIDE HCL 5 MG/ML IJ SOLN: 5.0000 mg | Freq: Three times a day (TID) | INTRAMUSCULAR | Status: DC | PRN

## 2022-06-02 MED ORDER — ONDANSETRON HCL 4 MG PO TABS: 4.0000 mg | ORAL_TABLET | Freq: Four times a day (QID) | ORAL | Status: DC | PRN

## 2022-06-02 MED ORDER — FENTANYL CITRATE (PF) 100 MCG/2ML IJ SOLN
INTRAMUSCULAR | Status: DC | PRN
  Administered 2022-06-02: 50 ug via INTRAVENOUS
  Administered 2022-06-02 (×2): 25 ug via INTRAVENOUS
  Administered 2022-06-02: 50 ug via INTRAVENOUS

## 2022-06-02 MED ORDER — OXYCODONE HCL 5 MG PO TABS
ORAL_TABLET | ORAL | Status: AC
  Administered 2022-06-02: 5 mg via ORAL
  Filled 2022-06-02: qty 1

## 2022-06-02 MED ORDER — DEXAMETHASONE SODIUM PHOSPHATE 10 MG/ML IJ SOLN
INTRAMUSCULAR | Status: DC | PRN
  Administered 2022-06-02: 10 mg via INTRAVENOUS

## 2022-06-02 MED ORDER — METOCLOPRAMIDE HCL 10 MG PO TABS: 5.0000 mg | ORAL_TABLET | Freq: Three times a day (TID) | ORAL | Status: DC | PRN

## 2022-06-02 MED ORDER — KETOROLAC TROMETHAMINE 30 MG/ML IJ SOLN
INTRAMUSCULAR | Status: AC
  Filled 2022-06-02: qty 1

## 2022-06-02 MED ORDER — FAMOTIDINE 20 MG PO TABS
ORAL_TABLET | ORAL | Status: AC
  Administered 2022-06-02: 20 mg via ORAL
  Filled 2022-06-02: qty 1

## 2022-06-02 MED ORDER — ONDANSETRON HCL 4 MG/2ML IJ SOLN: 4.0000 mg | Freq: Once | INTRAMUSCULAR | Status: DC | PRN

## 2022-06-02 MED ORDER — FENTANYL CITRATE (PF) 100 MCG/2ML IJ SOLN
25.0000 ug | INTRAMUSCULAR | Status: DC | PRN
  Administered 2022-06-02: 50 ug via INTRAVENOUS
  Administered 2022-06-02: 25 ug via INTRAVENOUS

## 2022-06-02 MED ORDER — CHLORHEXIDINE GLUCONATE 0.12 % MT SOLN
OROMUCOSAL | Status: AC
  Administered 2022-06-02: 15 mL via OROMUCOSAL
  Filled 2022-06-02: qty 15

## 2022-06-02 MED ORDER — BUPIVACAINE-EPINEPHRINE (PF) 0.5% -1:200000 IJ SOLN
INTRAMUSCULAR | Status: AC
  Filled 2022-06-02: qty 30

## 2022-06-02 MED ORDER — ONDANSETRON HCL 4 MG/2ML IJ SOLN
INTRAMUSCULAR | Status: DC | PRN
  Administered 2022-06-02: 4 mg via INTRAVENOUS

## 2022-06-02 MED ORDER — SCOPOLAMINE 1 MG/3DAYS TD PT72
1.0000 | MEDICATED_PATCH | TRANSDERMAL | Status: DC
  Administered 2022-06-02: 1.5 mg via TRANSDERMAL

## 2022-06-02 MED ORDER — KETAMINE HCL 10 MG/ML IJ SOLN
INTRAMUSCULAR | Status: DC | PRN
  Administered 2022-06-02: 10 mg via INTRAVENOUS
  Administered 2022-06-02: 15 mg via INTRAVENOUS

## 2022-06-02 MED ORDER — DEXMEDETOMIDINE HCL IN NACL 80 MCG/20ML IV SOLN
INTRAVENOUS | Status: DC | PRN
  Administered 2022-06-02: 4 ug via BUCCAL
  Administered 2022-06-02 (×2): 8 ug via BUCCAL

## 2022-06-02 MED ORDER — KETOROLAC TROMETHAMINE 30 MG/ML IJ SOLN
INTRAMUSCULAR | Status: DC | PRN
  Administered 2022-06-02: 30 mg via INTRAVENOUS

## 2022-06-02 MED ORDER — PROPOFOL 10 MG/ML IV BOLUS
INTRAVENOUS | Status: AC
  Filled 2022-06-02: qty 20

## 2022-06-02 MED ORDER — OXYCODONE HCL 5 MG/5ML PO SOLN: 5.0000 mg | Freq: Once | ORAL | Status: DC | PRN

## 2022-06-02 MED ORDER — BUPIVACAINE-EPINEPHRINE 0.5% -1:200000 IJ SOLN
INTRAMUSCULAR | Status: DC | PRN
  Administered 2022-06-02: 15 mL

## 2022-06-02 MED ORDER — CEFAZOLIN SODIUM-DEXTROSE 2-4 GM/100ML-% IV SOLN
INTRAVENOUS | Status: AC
  Filled 2022-06-02: qty 100

## 2022-06-02 MED ORDER — ACETAMINOPHEN 10 MG/ML IV SOLN
INTRAVENOUS | Status: AC
  Filled 2022-06-02: qty 100

## 2022-06-02 MED ORDER — ACETAMINOPHEN 10 MG/ML IV SOLN
INTRAVENOUS | Status: DC | PRN
  Administered 2022-06-02: 1000 mg via INTRAVENOUS

## 2022-06-02 MED ORDER — 0.9 % SODIUM CHLORIDE (POUR BTL) OPTIME
TOPICAL | Status: DC | PRN
  Administered 2022-06-02: 500 mL

## 2022-06-02 MED ORDER — OXYCODONE HCL 5 MG PO TABS: 5.0000 mg | ORAL_TABLET | ORAL | Status: DC | PRN

## 2022-06-02 MED ORDER — MIDAZOLAM HCL 2 MG/2ML IJ SOLN
INTRAMUSCULAR | Status: DC | PRN
  Administered 2022-06-02: 2 mg via INTRAVENOUS

## 2022-06-02 MED ORDER — SCOPOLAMINE 1 MG/3DAYS TD PT72
MEDICATED_PATCH | TRANSDERMAL | Status: AC
  Filled 2022-06-02: qty 1

## 2022-06-02 MED ORDER — ONDANSETRON HCL 4 MG/2ML IJ SOLN: 4.0000 mg | Freq: Four times a day (QID) | INTRAMUSCULAR | Status: DC | PRN

## 2022-06-02 MED ORDER — ONDANSETRON HCL 4 MG/2ML IJ SOLN
INTRAMUSCULAR | Status: AC
  Filled 2022-06-02: qty 2

## 2022-06-02 MED ORDER — DEXMEDETOMIDINE HCL IN NACL 80 MCG/20ML IV SOLN
INTRAVENOUS | Status: AC
  Filled 2022-06-02: qty 20

## 2022-06-02 MED ORDER — MIDAZOLAM HCL 2 MG/2ML IJ SOLN
INTRAMUSCULAR | Status: AC
  Filled 2022-06-02: qty 2

## 2022-06-02 MED ORDER — FENTANYL CITRATE (PF) 100 MCG/2ML IJ SOLN
INTRAMUSCULAR | Status: AC
  Filled 2022-06-02: qty 2

## 2022-06-02 MED ORDER — LIDOCAINE HCL (CARDIAC) PF 100 MG/5ML IV SOSY
PREFILLED_SYRINGE | INTRAVENOUS | Status: DC | PRN
  Administered 2022-06-02: 80 mg via INTRAVENOUS

## 2022-06-02 MED ORDER — PROPOFOL 500 MG/50ML IV EMUL
INTRAVENOUS | Status: DC | PRN
  Administered 2022-06-02: 125 ug/kg/min via INTRAVENOUS

## 2022-06-02 MED ORDER — OXYCODONE HCL 5 MG PO TABS: 5.0000 mg | ORAL_TABLET | Freq: Once | ORAL | Status: DC | PRN

## 2022-06-02 MED ORDER — KETAMINE HCL 50 MG/5ML IJ SOSY
PREFILLED_SYRINGE | INTRAMUSCULAR | Status: AC
  Filled 2022-06-02: qty 5

## 2022-06-02 MED ORDER — FENTANYL CITRATE (PF) 100 MCG/2ML IJ SOLN
INTRAMUSCULAR | Status: AC
  Administered 2022-06-02: 25 ug via INTRAVENOUS
  Filled 2022-06-02: qty 2

## 2022-06-02 MED ORDER — PROPOFOL 1000 MG/100ML IV EMUL
INTRAVENOUS | Status: AC
  Filled 2022-06-02: qty 100

## 2022-06-02 MED ORDER — ACETAMINOPHEN 10 MG/ML IV SOLN: 1000.0000 mg | Freq: Once | INTRAVENOUS | Status: DC | PRN

## 2022-06-02 MED ORDER — PROPOFOL 10 MG/ML IV BOLUS
INTRAVENOUS | Status: DC | PRN
  Administered 2022-06-02: 150 mg via INTRAVENOUS
  Administered 2022-06-02: 40 mg via INTRAVENOUS
  Administered 2022-06-02: 60 mg via INTRAVENOUS
  Administered 2022-06-02: 50 mg via INTRAVENOUS
  Administered 2022-06-02: 30 mg via INTRAVENOUS
  Administered 2022-06-02: 20 mg via INTRAVENOUS

## 2022-06-02 MED ORDER — SODIUM CHLORIDE 0.9 % IV SOLN: INTRAVENOUS | Status: DC

## 2022-06-02 SURGICAL SUPPLY — 47 items
4-0 PS 2 Vicryl IMPLANT
APL PRP STRL LF DISP 70% ISPRP (MISCELLANEOUS) ×1
BLADE SURG 15 STRL LF DISP TIS (BLADE) IMPLANT
BLADE SURG 15 STRL SS (BLADE) ×1
BLADE SURG SZ10 CARB STEEL (BLADE) ×1 IMPLANT
BNDG CMPR 5X4 CHSV STRCH STRL (GAUZE/BANDAGES/DRESSINGS) ×1
BNDG COHESIVE 4X5 TAN STRL LF (GAUZE/BANDAGES/DRESSINGS) ×1 IMPLANT
BNDG ELASTIC 4X5.8 VLCR STR LF (GAUZE/BANDAGES/DRESSINGS) ×1 IMPLANT
BNDG ESMARK 6X12 TAN STRL LF (GAUZE/BANDAGES/DRESSINGS) ×1 IMPLANT
CHLORAPREP W/TINT 26 (MISCELLANEOUS) ×2 IMPLANT
CUFF TOURN SGL QUICK 18X4 (TOURNIQUET CUFF) IMPLANT
CUFF TOURN SGL QUICK 24 (TOURNIQUET CUFF)
CUFF TRNQT CYL 24X4X16.5-23 (TOURNIQUET CUFF) IMPLANT
DRAPE FLUOR MINI C-ARM 54X84 (DRAPES) ×1 IMPLANT
DRAPE INCISE IOBAN 66X45 STRL (DRAPES) ×1 IMPLANT
DRAPE U-SHAPE 47X51 STRL (DRAPES) ×1 IMPLANT
ELECT CAUTERY BLADE 6.4 (BLADE) ×1 IMPLANT
ELECT REM PT RETURN 9FT ADLT (ELECTROSURGICAL) ×1
ELECTRODE REM PT RTRN 9FT ADLT (ELECTROSURGICAL) ×1 IMPLANT
GAUZE SPONGE 4X4 12PLY STRL (GAUZE/BANDAGES/DRESSINGS) ×1 IMPLANT
GAUZE XEROFORM 1X8 LF (GAUZE/BANDAGES/DRESSINGS) ×1 IMPLANT
GLOVE BIO SURGEON STRL SZ8 (GLOVE) ×2 IMPLANT
GLOVE SURG UNDER LTX SZ8 (GLOVE) ×1 IMPLANT
GOWN STRL REUS W/ TWL LRG LVL3 (GOWN DISPOSABLE) ×2 IMPLANT
GOWN STRL REUS W/ TWL XL LVL3 (GOWN DISPOSABLE) ×1 IMPLANT
GOWN STRL REUS W/TWL LRG LVL3 (GOWN DISPOSABLE) ×2
GOWN STRL REUS W/TWL XL LVL3 (GOWN DISPOSABLE) ×1
KIT TURNOVER KIT A (KITS) ×1 IMPLANT
MANIFOLD NEPTUNE II (INSTRUMENTS) ×1 IMPLANT
NDL FILTER BLUNT 18X1 1/2 (NEEDLE) ×1 IMPLANT
NEEDLE FILTER BLUNT 18X1 1/2 (NEEDLE) ×1 IMPLANT
NS IRRIG 1000ML POUR BTL (IV SOLUTION) ×1 IMPLANT
NS IRRIG 500ML POUR BTL (IV SOLUTION) IMPLANT
PACK EXTREMITY ARMC (MISCELLANEOUS) ×1 IMPLANT
STAPLER SKIN PROX 35W (STAPLE) ×1 IMPLANT
STOCKINETTE M/LG 89821 (MISCELLANEOUS) ×1 IMPLANT
SUT PROLENE 4 0 PS 2 18 (SUTURE) ×2 IMPLANT
SUT VIC AB 2-0 CT1 27 (SUTURE)
SUT VIC AB 2-0 CT1 TAPERPNT 27 (SUTURE) IMPLANT
SUT VIC AB 2-0 SH 27 (SUTURE) ×2
SUT VIC AB 2-0 SH 27XBRD (SUTURE) ×2 IMPLANT
SUT VICRYL 4-0  27 PS-2 BARIAT (SUTURE) ×2
SUT VICRYL 4-0 27 PS-2 BARIAT (SUTURE) ×2
SUTURE VICRYL 4-0 27 PS-2 BART (SUTURE) IMPLANT
SYR 10ML LL (SYRINGE) ×1 IMPLANT
TRAP FLUID SMOKE EVACUATOR (MISCELLANEOUS) ×1 IMPLANT
WATER STERILE IRR 500ML POUR (IV SOLUTION) ×1 IMPLANT

## 2022-06-02 NOTE — Transfer of Care (Signed)
Immediate Anesthesia Transfer of Care Note  Patient: Debra Richmond  Procedure(s) Performed: REMOVAL OF PAINFUL RETAINED SURGICAL HARDWARE OF LEFT KNEE. (Left: Knee)  Patient Location: PACU  Anesthesia Type:General  Level of Consciousness: drowsy  Airway & Oxygen Therapy: Patient Spontanous Breathing and Patient connected to nasal cannula oxygen  Post-op Assessment: Report given to RN and Post -op Vital signs reviewed and stable  Post vital signs: Reviewed and stable  Last Vitals:  Vitals Value Taken Time  BP 96/55 06/02/22 0942  Temp 96.4   Pulse 76 06/02/22 0944  Resp 16 06/02/22 0944  SpO2 99 % 06/02/22 0944  Vitals shown include unvalidated device data.  Last Pain:  Vitals:   06/02/22 0733  PainSc: 0-No pain         Complications: No notable events documented.

## 2022-06-02 NOTE — Anesthesia Postprocedure Evaluation (Signed)
Anesthesia Post Note  Patient: Debra Richmond  Procedure(s) Performed: REMOVAL OF PAINFUL RETAINED SURGICAL HARDWARE OF LEFT KNEE. (Left: Knee)  Patient location during evaluation: PACU Anesthesia Type: General Level of consciousness: awake and alert Pain management: pain level controlled Vital Signs Assessment: post-procedure vital signs reviewed and stable Respiratory status: spontaneous breathing, nonlabored ventilation, respiratory function stable and patient connected to nasal cannula oxygen Cardiovascular status: blood pressure returned to baseline and stable Postop Assessment: no apparent nausea or vomiting Anesthetic complications: no   No notable events documented.   Last Vitals:  Vitals:   06/02/22 1040 06/02/22 1057  BP: (!) 123/93 133/84  Pulse: 71 (!) 56  Resp:  20  Temp: (!) 36.1 C (!) 36.1 C  SpO2: 99% 99%    Last Pain:  Vitals:   06/02/22 1057  TempSrc: Temporal  PainSc: 3                  Arita Miss

## 2022-06-02 NOTE — Anesthesia Preprocedure Evaluation (Signed)
Anesthesia Evaluation  Patient identified by MRN, date of birth, ID band Patient awake    Reviewed: Allergy & Precautions, NPO status , Patient's Chart, lab work & pertinent test results  History of Anesthesia Complications (+) PONV and history of anesthetic complications  Airway Mallampati: I  TM Distance: >3 FB Neck ROM: Full    Dental no notable dental hx. (+) Teeth Intact   Pulmonary neg pulmonary ROS, neg sleep apnea, neg COPD, Patient abstained from smoking.Not current smoker, former smoker   Pulmonary exam normal breath sounds clear to auscultation       Cardiovascular Exercise Tolerance: Good METS(-) hypertension(-) CAD and (-) Past MI negative cardio ROS (-) dysrhythmias  Rhythm:Regular Rate:Normal - Systolic murmurs    Neuro/Psych  PSYCHIATRIC DISORDERS Anxiety     negative neurological ROS     GI/Hepatic ,GERD  Controlled,,(+)     (-) substance abuse    Endo/Other  neg diabetes    Renal/GU negative Renal ROS     Musculoskeletal   Abdominal   Peds  Hematology   Anesthesia Other Findings Past Medical History: No date: Anemia No date: Anxiety No date: Claustrophobia No date: Complication of anesthesia     Comment:  pt states she becomes very defiant after surgery No date: Family history of adverse reaction to anesthesia     Comment:  twin sister- n/v No date: GERD (gastroesophageal reflux disease) No date: History of kidney stones 2021: History of methicillin resistant staphylococcus aureus (MRSA) No date: PCOD (polycystic ovarian disease) No date: Rotator cuff disorder     Comment:  right shoulder No date: Seizure ([place])     Comment:  pt denies  Reproductive/Obstetrics                              Anesthesia Physical Anesthesia Plan  ASA: 2  Anesthesia Plan: General   Post-op Pain Management: Ofirmev IV (intra-op)* and Toradol IV (intra-op)*   Induction:  Intravenous  PONV Risk Score and Plan: 4 or greater and Ondansetron, Dexamethasone, Midazolam, TIVA, Propofol infusion and Scopolamine patch - Pre-op  Airway Management Planned: LMA  Additional Equipment: None  Intra-op Plan:   Post-operative Plan: Extubation in OR  Informed Consent: I have reviewed the patients History and Physical, chart, labs and discussed the procedure including the risks, benefits and alternatives for the proposed anesthesia with the patient or authorized representative who has indicated his/her understanding and acceptance.     Dental advisory given  Plan Discussed with: CRNA and Surgeon  Anesthesia Plan Comments: (Discussed risks of anesthesia with patient, including PONV, sore throat, lip/dental/eye damage. Rare risks discussed as well, such as cardiorespiratory and neurological sequelae, and allergic reactions. Discussed the role of CRNA in patient's perioperative care. Patient understands.  Given that patient says she does poorly with oral opioids, I discussed option of adductor canal nerve block, but she declined. )         Anesthesia Quick Evaluation

## 2022-06-02 NOTE — Anesthesia Procedure Notes (Signed)
Procedure Name: LMA Insertion Date/Time: 06/02/2022 8:35 AM  Performed by: Shaelynn Dragos, [place], CRNAPre-anesthesia Checklist: Patient identified, Patient being monitored, Timeout performed, Emergency Drugs available and Suction available Patient Re-evaluated:Patient Re-evaluated prior to induction Oxygen Delivery Method: Circle system utilized Preoxygenation: Pre-oxygenation with 100% oxygen Induction Type: IV induction Ventilation: Mask ventilation without difficulty LMA: LMA inserted LMA Size: 4.0 Tube type: Oral Number of attempts: 1 Placement Confirmation: positive ETCO2 and breath sounds checked- equal and bilateral Tube secured with: Tape Dental Injury: Teeth and Oropharynx as per pre-operative assessment

## 2022-06-02 NOTE — Discharge Instructions (Addendum)
Orthopedic discharge instructions: Keep dressing dry and intact.  May shower after dressing changed on post-op day #4 (Sunday).  Cover staples/sutures with Band-Aids after drying off. Apply ice frequently to knee. Take Naprosyn 500 mg BID OR ibuprofen 600-800 mg TID with meals for 5-7 days, then as necessary. Take pain medication as prescribed or ES Tylenol when needed.  May weight-bear as tolerated - use crutches or walker as needed. Follow-up in 10-14 days or as scheduled.     AMBULATORY SURGERY  DISCHARGE INSTRUCTIONS   The drugs that you were given will stay in your system until tomorrow so for the next 24 hours you should not:  Drive an automobile Make any legal decisions Drink any alcoholic beverage   You may resume regular meals tomorrow.  Today it is better to start with liquids and gradually work up to solid foods.  You may eat anything you prefer, but it is better to start with liquids, then soup and crackers, and gradually work up to solid foods.   Please notify your doctor immediately if you have any unusual bleeding, trouble breathing, redness and pain at the surgery site, drainage, fever, or pain not relieved by medication.     Your post-operative visit with Dr.                                       is: Date:                        Time:    Please call to schedule your post-operative visit.  Additional Instructions:

## 2022-06-02 NOTE — H&P (Signed)
History of Present Illness:  Debra Richmond is a 32 y.o. female who presents for history and physical for left knee hardware removal with Dr. Roland Rack on 06/02/2022. Patient underwent a left knee arthroscopy with debridement of a symptomatic plica and repair of the medial collateral ligament with internal brace augmentation by Dr. Roland Rack on 10/27/2021. She has focal pain along the incision sites where anchors were placed along the medial proximal tibia and medial femoral condyle. Patient has pain with touch as well as with walking. Pain is mild to moderate and has not been improving over the last 7 months. She has tried braces, sleeves with little relief. At times she feels as if her knee is unstable. She has taken anti-inflammatory medications and used ice and massage with little relief. Patient has completed physical therapy as well as home exercise program at home. She denies any warmth redness drainage or swelling  Current Outpatient Medications: oxyCODONE (ROXICODONE) 5 MG immediate release tablet Take by mouth   Allergies:  Clindamycin Other (Chest tighteness)  Dilaudid [Hydromorphone (Bulk)] Hives  Doxycycline Nausea and Vomiting  Garlic Hives  Prednisone Other (Disorientation)  Sulfa (Sulfonamide Antibiotics) Nausea and Vomiting   Past Medical History:  Abdominal pain (Intermittent & takes tramadol to help w/ this PRN)  Abnormal cytology 2016  Allergy  ANA positive 2019  Anxiety  Gastritis 03/26/2015  GERD (gastroesophageal reflux disease)  History of abnormal cervical Pap smear  Miscarriage 09/14, 01/15  MRSA (methicillin resistant Staphylococcus aureus)  Multiple skin abscesses requiring I&D.  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 Roland Rack)  EGD 08/14/12, 08/03/13  wisdom teeth   Family History:  High blood pressure (Hypertension) Mother  Endometriosis Mother  Gallbladder disease Mother  Lupus Mother  Clotting disorder Mother  Myocardial Infarction (Heart attack) Father  High blood pressure (Hypertension) Sister  Hemochromatosis Sister  High blood pressure (Hypertension) Maternal Grandmother  Coronary Artery Disease (Blocked arteries around heart) Maternal Grandmother  Deep vein thrombosis (DVT or abnormal blood clot formation) Maternal Grandmother  Dementia Maternal Grandmother   Social History:   Socioeconomic History:  Marital status: Married  Tobacco Use  Smoking status: Former  Packs/day: 0.25  Years: 0.00  Additional pack years: 0.00  Total pack years: 0.00  Types: Cigarettes  Smokeless tobacco: Never  Vaping Use  Vaping Use: Never used  Substance and Sexual Activity  Alcohol use: Yes  Alcohol/week: 0.0 standard drinks of alcohol  Comment: Occassionally  Drug use: Never  Sexual activity: Yes  Partners: Female  Birth control/protection: None   Review of Systems:  A comprehensive 14 point ROS was performed, reviewed, and the pertinent orthopaedic findings are documented in the HPI.  Physical Exam: Vitals:  06/01/22 0902  BP: 118/84  Weight: 83.5 kg (184 lb)  Height: 162.6 cm (5\' 4" )  PainSc: 4   General:  Well developed, well nourished, no apparent distress, normal affect, normal gait with no antalgic component.   HEENT: Head normocephalic, atraumatic, PERRL.   Abdomen: Soft, non tender, non distended, Bowel sounds present.  Heart: Examination of the heart  reveals regular, rate, and rhythm. There is  no murmur noted on ascultation. There is a normal apical pulse.  Lungs: Lungs are clear to auscultation. There is no wheeze, rhonchi, or crackles. There is normal expansion of bilateral chest walls.   Left knee exam: Examination of the left knee shows no swelling warmth erythema or effusion. Previous surgical incision sites on the medial aspect of the knee are well-healed with no signs of any infectious process. No swelling erythema ecchymosis. There is no knee effusion. She has normal knee range of motion. Knee is stable to valgus and varus stress testing. She has focal tenderness to palpation over the medial side of the knee along the distal edge of the medial femoral condyle and tender to palpation along the medial proximal tibia. Patient's patella tracks well. Knee is stable to stress testing and she has no pain with valgus or varus stress testing. She is neurovascular intact in left lower extremity.  X-rays/MRI/Lab data:  Standing AP and lateral x-rays of the left knee, as well as a sunrise view, are reviewed by me today from 03/12/2022 these films demonstrate what appears to be protrusion of the proximal Arthrex anchor by about 5 mm from the medial edge of the medial epicondyle. The anchor hole sites themselves appear to be well-positioned as seen on the lateral view. No significant degenerative changes are identified as the joint spaces well-maintained in all 3 compartments. No lytic lesions, fractures, or other acute bony abnormalities are identified.  Assessment: Painful orthopaedic hardware (CMS-HCC) left knee.   Plan: 32 year old female status post left knee MCL repair with internal brace augmentation by Dr. Roland Rack in May 2023. Patient is having focal pain along the hardware. Pain has been persistent despite conservative treatment. Patient has seen Dr. Roland Rack, risks, benefits, complications of left knee hardware removal have been discussed with the patient. Patient has agreed and consented  the procedure with Dr. Roland Rack on 06/02/2022.  The procedure was discussed with the patient, as were the potential risks (including bleeding, infection, nerve and/or blood vessel injury, persistent or recurrent pain, stiffness of the knee, need for further surgery, blood clots, strokes, heart attacks and/or arhythmias, pneumonia, etc.) and benefits. The patient states her understanding and wishes to proceed. All of the patient's questions and concerns were answered.    H&P reviewed and patient re-examined. No changes.

## 2022-06-02 NOTE — Op Note (Signed)
06/02/2022  9:48 AM  Patient:   Debra Richmond  Pre-Op Diagnosis:   Painful retained surgical hardware status post prior MCL repair, left knee.  Post-Op Diagnosis:   Same  Procedure:   Removal of surgical hardware from medial femoral condyle and medial tibia, left knee.  Surgeon:   Pascal Lux, MD  Assistant:   None  Anesthesia:   General LMA  Findings:   As above.  Complications:   None  Fluids:   800 cc crystalloid  EBL:   0 cc  UOP:   None  TT:   42 minutes at 300 mmHg  Drains:   None  Closure:   4-0 Vicryl subcuticular sutures  Brief Clinical Note:   The patient is a 32 year old female who is now 7+ months status post a repair/augmentation of a grade 2 medial collateral ligament injury to her left knee.  Despite extensive physical therapy, the patient continues to note discomfort in the medial aspect of her knee, both proximally in the area of the medial epicondylar region as well as distally over the distal anchor insertion site.  Recent x-rays confirm that the medial femoral epicondylar anchor is approximately 6 to 7 mm proud.  Therefore, the patient presents at this time for removal of painful hardware of her left knee.  Procedure:   The patient was brought into the operating room and laid in the supine position.  After adequate general laryngeal mask anesthesia was obtained, the patient's left lower extremity was prepped with ChloraPrep solution before being draped sterilely.  Preoperative antibiotics were administered.  A timeout was performed to verify the appropriate surgical site before the limb was exsanguinated with an Esmarch and the tourniquet inflated to 300 mmHg.  Utilizing the previous more proximal incision, the scar was ellipsed out before the incision was carried down through the subcutaneous tissues to expose the medial epicondylar region.  The prominent anchor was identified by palpation.  It was exposed using both sharp and blunt dissection before being  removed with a rongeur.  Several nonabsorbable suture knots also were identified in the area and removed along with adjacent suture material.    This wound was copiously irrigated with sterile saline solution and bulb irrigation before the longitudinal slit in the medial retinacular tissues was then reapproximated in a side-to-side fashion using 2-0 Vicryl interrupted sutures.  The subcutaneous tissues also were closed using 2-0 Vicryl interrupted sutures before the skin was closed using 4-0 Vicryl subcuticular sutures.   Next, the more distal site of pain was approached.  The previous distal incision was ellipsed out before dissection was carried down through the subcutaneous tissues to expose the medial edge of the tibia.  Again, the previously placed suture anchor was identified by palpation.  It was dissected free using sharp dissection before being removed with a rongeur.  Again, several small pieces of permanent suture also were identified and removed.  This wound was copiously irrigated with sterile saline solution using bulb irrigation before the subcutaneous tissues were closed using 2-0 Vicryl interrupted sutures.  The skin was closed using 4-0 Vicryl subcuticular sutures.  Benzoin and Steri-Strips were applied to both wounds before a total of 15 cc of 0.5% Sensorcaine with epinephrine was injected in and around these incisions to help with postoperative analgesia.  A sterile bulky compressive dressing was applied to the knee before the patient was awakened, extubated, and returned to the recovery room in satisfactory condition after tolerating the procedure well.

## 2022-06-03 ENCOUNTER — Encounter: Payer: Self-pay | Admitting: Surgery

## 2022-10-02 ENCOUNTER — Emergency Department
Admission: EM | Admit: 2022-10-02 | Discharge: 2022-10-02 | Disposition: A | Discharge status: Home / Self Care | Payer: BC Managed Care – PPO | Attending: Emergency Medicine | Admitting: Emergency Medicine

## 2022-10-02 ENCOUNTER — Other Ambulatory Visit: Payer: Self-pay

## 2022-10-02 DIAGNOSIS — F411 Generalized anxiety disorder: Secondary | ICD-10-CM | POA: Diagnosis not present

## 2022-10-02 DIAGNOSIS — F419 Anxiety disorder, unspecified: Secondary | ICD-10-CM

## 2022-10-02 LAB — COMPREHENSIVE METABOLIC PANEL
ALT: 43 U/L (ref 0–44)
AST: 27 U/L (ref 15–41)
Albumin: 4.7 g/dL (ref 3.5–5.0)
Alkaline Phosphatase: 82 U/L (ref 38–126)
Anion gap: 9 (ref 5–15)
BUN: 14 mg/dL (ref 6–20)
CO2: 22 mmol/L (ref 22–32)
Calcium: 9.2 mg/dL (ref 8.9–10.3)
Chloride: 108 mmol/L (ref 98–111)
Creatinine, Ser: 0.72 mg/dL (ref 0.44–1.00)
GFR, Estimated: >60 mL/min (ref >60)
Glucose, Bld: 85 mg/dL (ref 70–99)
Potassium: 3.2 mmol/L — ABNORMAL LOW (ref 3.5–5.1)
Sodium: 139 mmol/L (ref 135–145)
Total Bilirubin: 0.8 mg/dL (ref 0.3–1.2)
Total Protein: 7.7 g/dL (ref 6.5–8.1)

## 2022-10-02 LAB — CBC
HCT: 43.2 % (ref 36.0–46.0)
Hemoglobin: 14.3 g/dL (ref 12.0–15.0)
MCH: 30.4 pg (ref 26.0–34.0)
MCHC: 33.1 g/dL (ref 30.0–36.0)
MCV: 91.7 fL (ref 80.0–100.0)
Platelets: 366 10*3/uL (ref 150–400)
RBC: 4.71 MIL/uL (ref 3.87–5.11)
RDW: 12.3 % (ref 11.5–15.5)
WBC: 7.4 10*3/uL (ref 4.0–10.5)
nRBC: 0 % (ref 0.0–0.2)

## 2022-10-02 LAB — ACETAMINOPHEN LEVEL: Acetaminophen (Tylenol), Serum: <10 ug/mL — ABNORMAL LOW (ref 10–30)

## 2022-10-02 LAB — ETHANOL: Alcohol, Ethyl (B): <10 mg/dL (ref <10)

## 2022-10-02 LAB — SALICYLATE LEVEL: Salicylate Lvl: <7 mg/dL — ABNORMAL LOW (ref 7.0–30.0)

## 2022-10-02 MED ORDER — GABAPENTIN 100 MG PO CAPS: 100.0000 mg | ORAL_CAPSULE | Freq: Three times a day (TID) | ORAL | Status: DC

## 2022-10-02 MED ORDER — ESCITALOPRAM OXALATE 10 MG PO TABS: 10.0000 mg | ORAL_TABLET | Freq: Every day | ORAL | 0 refills | Status: DC

## 2022-10-02 MED ORDER — HYDROXYZINE HCL 10 MG PO TABS: 10.0000 mg | ORAL_TABLET | ORAL | Status: DC | PRN

## 2022-10-02 MED ORDER — GABAPENTIN 100 MG PO CAPS: 100.0000 mg | ORAL_CAPSULE | Freq: Three times a day (TID) | ORAL | Status: DC | PRN

## 2022-10-02 MED ORDER — HYDROXYZINE HCL 10 MG PO TABS: 10.0000 mg | ORAL_TABLET | ORAL | 0 refills | Status: AC | PRN

## 2022-10-02 MED ORDER — ESCITALOPRAM OXALATE 10 MG PO TABS
10.0000 mg | ORAL_TABLET | Freq: Every day | ORAL | Status: DC
  Administered 2022-10-02: 10 mg via ORAL
  Filled 2022-10-02: qty 1

## 2022-10-02 MED ORDER — HYDROXYZINE HCL 25 MG PO TABS
25.0000 mg | ORAL_TABLET | Freq: Once | ORAL | Status: AC
  Administered 2022-10-02: 25 mg via ORAL
  Filled 2022-10-02: qty 1

## 2022-10-02 NOTE — ED Provider Notes (Signed)
[facility] Provider Note    Event Date/Time   First MD Initiated Contact with Patient 10/02/22 1154     (approximate)   History   Anxiety   HPI  Zurielle Kohrs is a 32 y.o. female with a history of anxiety, PCOS, and GERD who presents with anxiety.  The patient states that she has been going through a lot recently and has been increasingly anxious.  She denies any SI or HI.  She denies any acute medical complaints.  I reviewed the past medical records.  The patient has no recent ED visits or admissions; her most recent documented medical encounter was orthopedic surgery with Dr. Joice Lofts on 1/3 of this year.   Physical Exam   Triage Vital Signs: ED Triage Vitals  Enc Vitals Group     BP 10/02/22 1127 133/87     Pulse Rate 10/02/22 1127 76     Resp 10/02/22 1127 18     Temp 10/02/22 1127 98.3 F (36.8 C)     Temp Source 10/02/22 1127 Oral     SpO2 10/02/22 1127 98 %     Weight 10/02/22 1129 173 lb (78.5 kg)     Height 10/02/22 1129 5\' 4"  (1.626 m)     Head Circumference --      Peak Flow --      Pain Score 10/02/22 1205 0     Pain Loc --      Pain Edu? --      Excl. in GC? --     Most recent vital signs: Vitals:   10/02/22 1127  BP: 133/87  Pulse: 76  Resp: 18  Temp: 98.3 F (36.8 C)  SpO2: 98%     General: Awake, no distress.  CV:  Good peripheral perfusion.  Resp:  Normal effort.  Abd:  No distention.  Other:  Calm and cooperative.   ED Results / Procedures / Treatments   Labs (all labs ordered are listed, but only abnormal results are displayed) Labs Reviewed  COMPREHENSIVE METABOLIC PANEL - Abnormal; Notable for the following components:      Result Value   Potassium 3.2 (*)    All other components within normal limits  SALICYLATE LEVEL - Abnormal; Notable for the following components:   Salicylate Lvl <7.0 (*)    All other components within normal limits  ACETAMINOPHEN LEVEL - Abnormal; Notable for the following  components:   Acetaminophen (Tylenol), Serum <10 (*)    All other components within normal limits  ETHANOL  CBC  URINE DRUG SCREEN, QUALITATIVE ([facility] ONLY)     EKG   RADIOLOGY   PROCEDURES:  Critical Care performed: No  Procedures   MEDICATIONS ORDERED IN ED: Medications  hydrOXYzine (ATARAX) tablet 10 mg (has no administration in time range)  escitalopram (LEXAPRO) tablet 10 mg (10 mg Oral Given 10/02/22 1338)  hydrOXYzine (ATARAX) tablet 25 mg (25 mg Oral Given 10/02/22 1328)     IMPRESSION / MDM / ASSESSMENT AND PLAN / ED COURSE  I reviewed the triage vital signs and the nursing notes.  Differential diagnosis includes, but is not limited to, anxiety disorder, mood disorder; the patient denies any substance use.  Patient's presentation is most consistent with acute, uncomplicated illness.  We will obtain lab workup for medical clearance and I have consulted psychiatry and TTS for evaluation.  The patient has been placed in psychiatric observation due to the need to provide a safe environment for the patient while obtaining psychiatric  consultation and evaluation, as well as ongoing medical and medication management to treat the patient's condition.  The patient has not been placed under full IVC at this time.   ----------------------------------------- 3:06 PM on 10/02/2022 -----------------------------------------  I consulted and discussed the case with NP Lord from psychiatry who evaluated the patient and has cleared her for discharge and given prescriptions.  Return precautions provided.  FINAL CLINICAL IMPRESSION(S) / ED DIAGNOSES   Final diagnoses:  Anxiety     Rx / DC Orders   ED Discharge Orders          Ordered    escitalopram (LEXAPRO) 10 MG tablet  Daily        10/02/22 1330    hydrOXYzine (ATARAX) 10 MG tablet  Every 4 hours PRN        10/02/22 1330             Note:  This document was prepared using Dragon voice recognition software  and may include unintentional dictation errors.    Dionne Bucy, MD 10/02/22 [phone]

## 2022-10-02 NOTE — Consult Note (Signed)
[facility] Face-to-Face Psychiatry Consult   Reason for Consult:  anxiety, panic attacks Referring Physician:  EDP Patient Identification: Debra Richmond MRN:  [id number] Principal Diagnosis: Generalized anxiety disorder Diagnosis:  Principal Problem:   Generalized anxiety disorder   Total Time spent with patient: 45 minutes  Subjective:   Debra Richmond is a 32 y.o. female patient admitted with anxiety and panic attacks.  HPI:  32 yo female presented to the ED with anxiety and panic attacks.  She is with her sister who is very supportive.  Currently, Kes is stressed with relationship issues with her wife and a possible divorce.  Depression is "slight", no suicidal ideations or past attempts or psychiatric admissions.  Moderate to high anxiety fluctuates throughout the day, increases with stress.  Lexapro in the past made it better.  Panic attacks frequently.  "I'm in a constant state of worry".  Her sleep is poor for the past two weeks with initiation and maintenance.  No mania symptoms, reports being tired & fatigued.  Appetite is decreased with no weight changes.  Denies trauma.  No hallucinations or paranoia or substance use.  Discussed medications and decided to restart Lexapro and PRN hydroxyzine 10 PRN every 4 hours and 30-40 mg for sleep PRN.  Described side effects including suicidal ideations and mania symptoms, instructed to stop the medication and follow up with her PCP who she has an appointment with in 3 weeks, therapist already in place (online).  No issues with Lexapro in the past.  RX ordered and she will follow up with her PCP and therapist.  Past Psychiatric History: anxiety, panic attacks  Risk to Self:  none Risk to Others:  none Prior Inpatient Therapy:  none Prior Outpatient Therapy:  therapy  Past Medical History:  Past Medical History:  Diagnosis Date   Anemia    Anxiety    Claustrophobia    Complication of anesthesia    pt states she becomes very defiant after surgery    Family history of adverse reaction to anesthesia    twin sister- n/v   GERD (gastroesophageal reflux disease)    History of kidney stones    History of methicillin resistant staphylococcus aureus (MRSA) 2021   PCOD (polycystic ovarian disease)    PONV (postoperative nausea and vomiting)    Rotator cuff disorder    right shoulder   Seizure (HCC)    pt denies    Past Surgical History:  Procedure Laterality Date   CHOLECYSTECTOMY  10/19/2012   HARDWARE REMOVAL Left 06/02/2022   Procedure: REMOVAL OF PAINFUL RETAINED SURGICAL HARDWARE OF LEFT KNEE.;  Surgeon: Christena Flake, MD;  Location: [facility] ORS;  Service: Orthopedics;  Laterality: Left;   KNEE ARTHROSCOPY WITH ANTERIOR CRUCIATE LIGAMENT (ACL) REPAIR Left 10/27/2021   Procedure: LEFT KNEE ARTHROSCOPY WITH DEBRIDEMENT AND REPAIR/RECONSTRUCTION OF HER MEDIAL COLLATERAL LIGAMENT;  Surgeon: Christena Flake, MD;  Location: [facility] ORS;  Service: Orthopedics;  Laterality: Left;   WISDOM TOOTH EXTRACTION Bilateral 04/29/2017   upper   Family History:  Family History  Problem Relation Age of Onset   Lupus Mother    Family Psychiatric  History: none Social History:  Social History   Substance and Sexual Activity  Alcohol Use Not Currently   Comment: rarely     Social History   Substance and Sexual Activity  Drug Use No    Social History   Socioeconomic History   Marital status: Married    Spouse name: Not on file   Number of  children: Not on file   Years of education: Not on file   Highest education level: Not on file  Occupational History   Not on file  Tobacco Use   Smoking status: Former    Packs/day: 0.50    Years: 10.00    Additional pack years: 0.00    Total pack years: 5.00    Types: Cigarettes    Quit date: 2022    Years since quitting: 2.3   Smokeless tobacco: Never  Vaping Use   Vaping Use: Every day   Substances: Nicotine, Flavoring  Substance and Sexual Activity   Alcohol use: Not Currently    Comment:  rarely   Drug use: No   Sexual activity: Yes    Partners: Female    Birth control/protection: Other-see comments    Comment: same sex partner, long term  Other Topics Concern   Not on file  Social History Narrative   Not on file   Social Determinants of Health   Financial Resource Strain: Not on file  Food Insecurity: Not on file  Transportation Needs: Not on file  Physical Activity: Not on file  Stress: Not on file  Social Connections: Not on file   Additional Social History:    Allergies:   Allergies  Allergen Reactions   Prednisone Other (See Comments)    Disorientation   Dilaudid [Hydromorphone Hcl] Hives   Hydromorphone Rash   Doxycycline Nausea Only and Nausea And Vomiting   Garlic Nausea Only   Vancomycin     Rash   Clindamycin Diarrhea    Chest tighteness   Sulfa Antibiotics Nausea And Vomiting and Nausea Only    Labs:  Results for orders placed or performed during the hospital encounter of 10/02/22 (from the past 48 hour(s))  Comprehensive metabolic panel     Status: Abnormal   Collection Time: 10/02/22 11:29 AM  Result Value Ref Range   Sodium 139 135 - 145 mmol/L   Potassium 3.2 (L) 3.5 - 5.1 mmol/L   Chloride 108 98 - 111 mmol/L   CO2 22 22 - 32 mmol/L   Glucose, Bld 85 70 - 99 mg/dL    Comment: Glucose reference range applies only to samples taken after fasting for at least 8 hours.   BUN 14 6 - 20 mg/dL   Creatinine, Ser 1.61 0.44 - 1.00 mg/dL   Calcium 9.2 8.9 - 09.6 mg/dL   Total Protein 7.7 6.5 - 8.1 g/dL   Albumin 4.7 3.5 - 5.0 g/dL   AST 27 15 - 41 U/L   ALT 43 0 - 44 U/L   Alkaline Phosphatase 82 38 - 126 U/L   Total Bilirubin 0.8 0.3 - 1.2 mg/dL   GFR, Estimated >04 >54 mL/min    Comment: (NOTE) Calculated using the CKD-EPI Creatinine Equation (2021)    Anion gap 9 5 - 15    Comment: Performed at [facility], [address] [place], [place] [address]  Ethanol     Status: None   Collection Time: 10/02/22 11:29 AM   Result Value Ref Range   Alcohol, Ethyl (B) <10 <10 mg/dL    Comment: (NOTE) Lowest detectable limit for serum alcohol is 10 mg/dL.  For medical purposes only. Performed at [facility], [address] [place], [place] [address]   Salicylate level     Status: Abnormal   Collection Time: 10/02/22 11:29 AM  Result Value Ref Range   Salicylate Lvl <7.0 (L) 7.0 - 30.0 mg/dL  Comment: Performed at [facility], [address] [place], [place] [address]  Acetaminophen level     Status: Abnormal   Collection Time: 10/02/22 11:29 AM  Result Value Ref Range   Acetaminophen (Tylenol), Serum <10 (L) 10 - 30 ug/mL    Comment: (NOTE) Therapeutic concentrations vary significantly. A range of 10-30 ug/mL  may be an effective concentration for many patients. However, some  are best treated at concentrations outside of this range. Acetaminophen concentrations >150 ug/mL at 4 hours after ingestion  and >50 ug/mL at 12 hours after ingestion are often associated with  toxic reactions.  Performed at [facility], [address] [place], [place] [address]   cbc     Status: None   Collection Time: 10/02/22 11:29 AM  Result Value Ref Range   WBC 7.4 4.0 - 10.5 K/uL   RBC 4.71 3.87 - 5.11 MIL/uL   Hemoglobin 14.3 12.0 - 15.0 g/dL   HCT 47.8 29.5 - 62.1 %   MCV 91.7 80.0 - 100.0 fL   MCH 30.4 26.0 - 34.0 pg   MCHC 33.1 30.0 - 36.0 g/dL   RDW 30.8 65.7 - 84.6 %   Platelets 366 150 - 400 K/uL   nRBC 0.0 0.0 - 0.2 %    Comment: Performed at [facility], [address], [place], [place] [address]    Current Facility-Administered Medications  Medication Dose Route Frequency Provider Last Rate Last Admin   hydrOXYzine (ATARAX) tablet 25 mg  25 mg Oral Once Dionne Bucy, MD       No current outpatient medications on file.    Musculoskeletal: Strength & Muscle Tone: within normal limits Gait & Station: normal Patient leans:  N/A  Psychiatric Specialty Exam: Physical Exam Vitals and nursing note reviewed.  Constitutional:      Appearance: Normal appearance.  HENT:     Head: Normocephalic.     Nose: Nose normal.  Pulmonary:     Effort: Pulmonary effort is normal.  Musculoskeletal:        General: Normal range of motion.     Cervical back: Normal range of motion.  Neurological:     General: No focal deficit present.     Mental Status: She is alert and oriented to person, place, and time.  Psychiatric:        Attention and Perception: Attention and perception normal.        Mood and Affect: Mood is anxious and depressed.        Speech: Speech normal.        Behavior: Behavior normal. Behavior is cooperative.        Thought Content: Thought content normal.        Cognition and Memory: Cognition and memory normal.        Judgment: Judgment normal.     Review of Systems  Psychiatric/Behavioral:  Positive for depression. The patient is nervous/anxious.   All other systems reviewed and are negative.   Blood pressure 133/87, pulse 76, temperature 98.3 F (36.8 C), temperature source Oral, resp. rate 18, height 5\' 4"  (1.626 m), weight 78.5 kg, last menstrual period 10/02/2022, SpO2 98 %.Body mass index is 29.7 kg/m.  General Appearance: Casual  Eye Contact:  Good  Speech:  Normal Rate  Volume:  Normal  Mood:  Anxious  Affect:  Congruent  Thought Process:  Coherent  Orientation:  Full (Time, Place, and Person)  Thought Content:  Rumination  Suicidal Thoughts:  No  Homicidal  Thoughts:  No  Memory:  Immediate;   Good Recent;   Good Remote;   Good  Judgement:  Good  Insight:  Good  Psychomotor Activity:  Normal  Concentration:  Concentration: Good and Attention Span: Good  Recall:  Good  Fund of Knowledge:  Good  Language:  Good  Akathisia:  No  Handed:  Right  AIMS (if indicated):     Assets:  Housing Leisure Time Physical Health Resilience Social Support  ADL's:  Intact  Cognition:   WNL  Sleep:        Physical Exam: Physical Exam Vitals and nursing note reviewed.  Constitutional:      Appearance: Normal appearance.  HENT:     Head: Normocephalic.     Nose: Nose normal.  Pulmonary:     Effort: Pulmonary effort is normal.  Musculoskeletal:        General: Normal range of motion.     Cervical back: Normal range of motion.  Neurological:     General: No focal deficit present.     Mental Status: She is alert and oriented to person, place, and time.  Psychiatric:        Attention and Perception: Attention and perception normal.        Mood and Affect: Mood is anxious and depressed.        Speech: Speech normal.        Behavior: Behavior normal. Behavior is cooperative.        Thought Content: Thought content normal.        Cognition and Memory: Cognition and memory normal.        Judgment: Judgment normal.    Review of Systems  Psychiatric/Behavioral:  Positive for depression. The patient is nervous/anxious.   All other systems reviewed and are negative.  Blood pressure 133/87, pulse 76, temperature 98.3 F (36.8 C), temperature source Oral, resp. rate 18, height 5\' 4"  (1.626 m), weight 78.5 kg, last menstrual period 10/02/2022, SpO2 98 %. Body mass index is 29.7 kg/m.  Treatment Plan Summary: General anxiety disorder: Lexapro 10 mg daily started Hydroxyzine 10 mg every 4 hours PRN anxiety and 30-40 mg at bedtime PRN sleep Follow up with PCP as scheduled  Disposition: No evidence of imminent risk to self or others at present.   Patient does not meet criteria for psychiatric inpatient admission. Supportive therapy provided about ongoing stressors.  Nanine Means, NP 10/02/2022 1:28 PM

## 2022-10-02 NOTE — ED Notes (Signed)
This tech provided pt with [facility] scrubs.  Lauren (sister) will be taking belongings to home.

## 2022-10-02 NOTE — Discharge Instructions (Signed)
Follow-up with your regular doctors.  Return to the ER for new, worsening, or persistent severe anxiety, or any other new or worsening symptoms that concern you.

## 2022-10-02 NOTE — ED Triage Notes (Signed)
Pt presents via POV with sister reports having panic attacks and needing medication to help with panic attacks until she can see a therapist in 3 weeks. Denies SI/HI. Tearful on triage. Reports going through a possible divorce. Denies psych follow as OP.

## 2022-10-02 NOTE — ED Notes (Signed)
Nanine Means psych NP at bedside to evaluate.

## 2022-12-28 ENCOUNTER — Other Ambulatory Visit: Payer: Self-pay | Admitting: Surgery

## 2022-12-28 DIAGNOSIS — M6752 Plica syndrome, left knee: Secondary | ICD-10-CM

## 2022-12-28 DIAGNOSIS — M7122 Synovial cyst of popliteal space [Baker], left knee: Secondary | ICD-10-CM

## 2022-12-28 DIAGNOSIS — S83412A Sprain of medial collateral ligament of left knee, initial encounter: Secondary | ICD-10-CM

## 2023-01-10 ENCOUNTER — Ambulatory Visit
Admission: RE | Admit: 2023-01-10 | Discharge: 2023-01-10 | Disposition: A | Discharge status: Home / Self Care | Payer: BC Managed Care – PPO | Source: Ambulatory Visit | Attending: Surgery | Admitting: Surgery

## 2023-01-10 DIAGNOSIS — M6752 Plica syndrome, left knee: Secondary | ICD-10-CM

## 2023-01-10 DIAGNOSIS — M7122 Synovial cyst of popliteal space [Baker], left knee: Secondary | ICD-10-CM

## 2023-01-10 DIAGNOSIS — S83412A Sprain of medial collateral ligament of left knee, initial encounter: Secondary | ICD-10-CM

## 2023-03-28 ENCOUNTER — Emergency Department: Payer: BC Managed Care – PPO

## 2023-03-28 ENCOUNTER — Other Ambulatory Visit: Payer: Self-pay

## 2023-03-28 ENCOUNTER — Emergency Department
Admission: EM | Admit: 2023-03-28 | Discharge: 2023-03-28 | Discharge status: Against Medical Advice | Payer: BC Managed Care – PPO | Attending: Emergency Medicine | Admitting: Emergency Medicine

## 2023-03-28 DIAGNOSIS — W228XXA Striking against or struck by other objects, initial encounter: Secondary | ICD-10-CM | POA: Diagnosis not present

## 2023-03-28 DIAGNOSIS — Z5321 Procedure and treatment not carried out due to patient leaving prior to being seen by health care provider: Secondary | ICD-10-CM | POA: Insufficient documentation

## 2023-03-28 DIAGNOSIS — S6992XA Unspecified injury of left wrist, hand and finger(s), initial encounter: Secondary | ICD-10-CM | POA: Diagnosis present

## 2023-03-28 NOTE — ED Notes (Signed)
No answer when called several times from lobby 

## 2023-03-28 NOTE — ED Notes (Signed)
Patient called twice for x-ray without response.

## 2023-03-28 NOTE — ED Triage Notes (Signed)
Pt to ED via POV c/o L wrist injury about an hr ago. Pt tripped over dog, hitting wrist on freezer. Pt having some redness and swelling to wrist.

## 2023-04-16 ENCOUNTER — Emergency Department: Payer: BC Managed Care – PPO

## 2023-04-16 ENCOUNTER — Other Ambulatory Visit: Payer: Self-pay

## 2023-04-16 ENCOUNTER — Emergency Department
Admission: EM | Admit: 2023-04-16 | Discharge: 2023-04-16 | Disposition: A | Discharge status: Home / Self Care | Payer: BC Managed Care – PPO | Attending: Emergency Medicine | Admitting: Emergency Medicine

## 2023-04-16 DIAGNOSIS — N201 Calculus of ureter: Secondary | ICD-10-CM

## 2023-04-16 DIAGNOSIS — R109 Unspecified abdominal pain: Secondary | ICD-10-CM | POA: Diagnosis present

## 2023-04-16 DIAGNOSIS — N132 Hydronephrosis with renal and ureteral calculous obstruction: Secondary | ICD-10-CM | POA: Diagnosis not present

## 2023-04-16 LAB — URINALYSIS, ROUTINE W REFLEX MICROSCOPIC
Bilirubin Urine: NEGATIVE
Glucose, UA: NEGATIVE mg/dL
Ketones, ur: NEGATIVE mg/dL
Leukocytes,Ua: NEGATIVE
Nitrite: NEGATIVE
Protein, ur: 100 mg/dL — AB
Specific Gravity, Urine: 1.031 — ABNORMAL HIGH (ref 1.005–1.030)
pH: 5 (ref 5.0–8.0)

## 2023-04-16 LAB — CBC WITH DIFFERENTIAL/PLATELET
Abs Immature Granulocytes: 0.02 10*3/uL (ref 0.00–0.07)
Basophils Absolute: 0 10*3/uL (ref 0.0–0.1)
Basophils Relative: 1 %
Eosinophils Absolute: 0.1 10*3/uL (ref 0.0–0.5)
Eosinophils Relative: 1 %
HCT: 43.2 % (ref 36.0–46.0)
Hemoglobin: 14.6 g/dL (ref 12.0–15.0)
Immature Granulocytes: 0 %
Lymphocytes Relative: 29 %
Lymphs Abs: 1.7 10*3/uL (ref 0.7–4.0)
MCH: 31 pg (ref 26.0–34.0)
MCHC: 33.8 g/dL (ref 30.0–36.0)
MCV: 91.7 fL (ref 80.0–100.0)
Monocytes Absolute: 0.5 10*3/uL (ref 0.1–1.0)
Monocytes Relative: 9 %
Neutro Abs: 3.6 10*3/uL (ref 1.7–7.7)
Neutrophils Relative %: 60 %
Platelets: 307 10*3/uL (ref 150–400)
RBC: 4.71 MIL/uL (ref 3.87–5.11)
RDW: 12 % (ref 11.5–15.5)
WBC: 5.9 10*3/uL (ref 4.0–10.5)
nRBC: 0 % (ref 0.0–0.2)

## 2023-04-16 LAB — BASIC METABOLIC PANEL
Anion gap: 8 (ref 5–15)
BUN: 14 mg/dL (ref 6–20)
CO2: 22 mmol/L (ref 22–32)
Calcium: 8.8 mg/dL — ABNORMAL LOW (ref 8.9–10.3)
Chloride: 107 mmol/L (ref 98–111)
Creatinine, Ser: 0.73 mg/dL (ref 0.44–1.00)
GFR, Estimated: >60 mL/min (ref >60)
Glucose, Bld: 103 mg/dL — ABNORMAL HIGH (ref 70–99)
Potassium: 3.8 mmol/L (ref 3.5–5.1)
Sodium: 137 mmol/L (ref 135–145)

## 2023-04-16 LAB — PREGNANCY, URINE: Preg Test, Ur: NEGATIVE

## 2023-04-16 MED ORDER — ONDANSETRON 4 MG PO TBDP
4.0000 mg | ORAL_TABLET | Freq: Once | ORAL | Status: AC
  Administered 2023-04-16: 4 mg via ORAL
  Filled 2023-04-16: qty 1

## 2023-04-16 MED ORDER — NAPROXEN 500 MG PO TABS: 500.0000 mg | ORAL_TABLET | Freq: Two times a day (BID) | ORAL | 0 refills | Status: DC

## 2023-04-16 MED ORDER — ONDANSETRON 4 MG PO TBDP: 4.0000 mg | ORAL_TABLET | Freq: Three times a day (TID) | ORAL | 0 refills | Status: DC | PRN

## 2023-04-16 MED ORDER — KETOROLAC TROMETHAMINE 15 MG/ML IJ SOLN
15.0000 mg | Freq: Once | INTRAMUSCULAR | Status: AC
  Administered 2023-04-16: 15 mg via INTRAMUSCULAR
  Filled 2023-04-16: qty 1

## 2023-04-16 NOTE — ED Provider Notes (Signed)
[facility] Provider Note    Event Date/Time   First MD Initiated Contact with Patient 04/16/23 [phone]     (approximate)   History   Flank Pain   HPI  Debra Richmond is a 32 y.o. female with a past medical history of generalized anxiety disorder, PCOS who presents today for evaluation of right sided flank pain that began this morning.  Patient reports that she has nausea without vomiting.  She denies burning with urination.  She has not noticed hematuria.  No fevers or chills.  No history of kidney stones.  No abdominal pain.  Patient Active Problem List   Diagnosis Date Noted   Generalized anxiety disorder 10/02/2022   Cutaneous abscess of left upper extremity 01/21/2016   PCOS (polycystic ovarian syndrome) 01/14/2016   Right shoulder strain 08/27/2015   Right shoulder tendinitis 08/27/2015   Gastro-esophageal reflux disease with esophagitis 08/14/2012          Physical Exam   Triage Vital Signs: ED Triage Vitals [04/16/23 0827]  Encounter Vitals Group     BP (!) 145/109     Systolic BP Percentile      Diastolic BP Percentile      Pulse Rate 72     Resp 18     Temp 97.6 F (36.4 C)     Temp src      SpO2 99 %     Weight 165 lb (74.8 kg)     Height 5\' 4"  (1.626 m)     Head Circumference      Peak Flow      Pain Score 8     Pain Loc      Pain Education      Exclude from Growth Chart     Most recent vital signs: Vitals:   04/16/23 0827  BP: (!) 145/109  Pulse: 72  Resp: 18  Temp: 97.6 F (36.4 C)  SpO2: 99%    Physical Exam Vitals and nursing note reviewed.  Constitutional:      General: Awake and alert.  Appears uncomfortable    Appearance: Normal appearance. The patient is normal weight.  HENT:     Head: Normocephalic and atraumatic.     Mouth: Mucous membranes are moist.  Eyes:     General: PERRL. Normal EOMs        Right eye: No discharge.        Left eye: No discharge.     Conjunctiva/sclera: Conjunctivae normal.   Cardiovascular:     Rate and Rhythm: Normal rate and regular rhythm.     Pulses: Normal pulses.  Pulmonary:     Effort: Pulmonary effort is normal. No respiratory distress.     Breath sounds: Normal breath sounds.  Abdominal:     Abdomen is soft. There is no abdominal tenderness. No rebound or guarding. No distention.  No CVA tenderness Musculoskeletal:        General: No swelling. Normal range of motion.     Cervical back: Normal range of motion and neck supple.  Skin:    General: Skin is warm and dry.     Capillary Refill: Capillary refill takes less than 2 seconds.     Findings: No rash.  Neurological:     Mental Status: The patient is awake and alert.      ED Results / Procedures / Treatments   Labs (all labs ordered are listed, but only abnormal results are displayed) Labs Reviewed  URINALYSIS, ROUTINE W  REFLEX MICROSCOPIC - Abnormal; Notable for the following components:      Result Value   Color, Urine YELLOW (*)    APPearance TURBID (*)    Specific Gravity, Urine 1.031 (*)    Hgb urine dipstick MODERATE (*)    Protein, ur 100 (*)    Bacteria, UA MANY (*)    All other components within normal limits  BASIC METABOLIC PANEL - Abnormal; Notable for the following components:   Glucose, Bld 103 (*)    Calcium 8.8 (*)    All other components within normal limits  PREGNANCY, URINE  CBC WITH DIFFERENTIAL/PLATELET     EKG     RADIOLOGY I independently reviewed and interpreted imaging and agree with radiologists findings.     PROCEDURES:  Critical Care performed:   Procedures   MEDICATIONS ORDERED IN ED: Medications  ketorolac (TORADOL) 15 MG/ML injection 15 mg (15 mg Intramuscular Given 04/16/23 0901)  ondansetron (ZOFRAN-ODT) disintegrating tablet 4 mg (4 mg Oral Given 04/16/23 0901)     IMPRESSION / MDM / ASSESSMENT AND PLAN / ED COURSE  I reviewed the triage vital signs and the nursing notes.   Differential diagnosis includes, but is not  limited to, nephrolithiasis, UTI, pyelonephritis, ureteral colic.  Patient is awake and alert, hemodynamically stable and afebrile.  She does appear to be slightly uncomfortable.  Further workup is indicated.  Labs obtained are overall reassuring, no leukocytosis, normal creatinine.  Her pregnancy is negative.  Urinalysis obtained demonstrates no leukocytes or nitrites, though many bacteria though she does have 21-50 squamous cells, therefore most likely contaminant.  Her CT renal stone does reveal a 2 mm distal ureteral stone on the right side, which is consistent with her pain.  I did discuss with Dr. Richardo Hanks given the many bacteria in her urine, though he agrees that this is likely contaminant and does not feel that she needs antibiotics.  He also does not feel that she requires outpatient follow-up with urology if her symptoms resolve in the next 1 week.  I discussed this with the patient and she is agreeable with this plan.  She was treated with Toradol and Zofran with good effect.  She was discharged on naproxen and Zofran.  We also discussed tricked return precautions.  Patient or stands and agrees with plan.  She was discharged in stable condition.  Patient's presentation is most consistent with acute complicated illness / injury requiring diagnostic workup.   Clinical Course as of 04/16/23 1226  Sat Apr 16, 2023  0930 Message sent to Dr. Richardo Hanks [JP]  [phone] Per Dr. Richardo Hanks, no need for abx or urology follow up unless she still has pain in 1 week [JP]    Clinical Course User Index [JP] Yoanna Jurczyk, Herb Grays, PA-C     FINAL CLINICAL IMPRESSION(S) / ED DIAGNOSES   Final diagnoses:  Ureterolithiasis     Rx / DC Orders   ED Discharge Orders          Ordered    naproxen (NAPROSYN) 500 MG tablet  2 times daily with meals        04/16/23 1013    ondansetron (ZOFRAN-ODT) 4 MG disintegrating tablet  Every 8 hours PRN        04/16/23 1013             Note:  This document was prepared  using Dragon voice recognition software and may include unintentional dictation errors.   Jackelyn Hoehn, PA-C 04/16/23 1226  Chesley Noon, MD 04/16/23 [phone]

## 2023-04-16 NOTE — Discharge Instructions (Signed)
You have a 2 mm kidney stone.  This should pass on its own.  You may take the medication as prescribed to help with your pain.  Please follow-up with your outpatient provider or urology if your symptoms persist for 1 week.  Please return for any new, worsening, or change in symptoms or other concerns as we discussed.

## 2023-04-16 NOTE — ED Triage Notes (Signed)
Pt to ED for right sided flank pain started this am. +nausea. Reports burning with urination.

## 2023-04-18 ENCOUNTER — Emergency Department: Payer: BC Managed Care – PPO

## 2023-04-18 ENCOUNTER — Emergency Department
Admission: EM | Admit: 2023-04-18 | Discharge: 2023-04-18 | Disposition: A | Discharge status: Home / Self Care | Payer: BC Managed Care – PPO | Attending: Emergency Medicine | Admitting: Emergency Medicine

## 2023-04-18 ENCOUNTER — Encounter: Payer: Self-pay | Admitting: Emergency Medicine

## 2023-04-18 ENCOUNTER — Other Ambulatory Visit: Payer: Self-pay

## 2023-04-18 DIAGNOSIS — N132 Hydronephrosis with renal and ureteral calculous obstruction: Secondary | ICD-10-CM | POA: Insufficient documentation

## 2023-04-18 DIAGNOSIS — N200 Calculus of kidney: Secondary | ICD-10-CM

## 2023-04-18 DIAGNOSIS — R109 Unspecified abdominal pain: Secondary | ICD-10-CM | POA: Diagnosis present

## 2023-04-18 LAB — URINALYSIS, ROUTINE W REFLEX MICROSCOPIC
Bilirubin Urine: NEGATIVE
Glucose, UA: NEGATIVE mg/dL
Hgb urine dipstick: NEGATIVE
Ketones, ur: 20 mg/dL — AB
Leukocytes,Ua: NEGATIVE
Nitrite: NEGATIVE
Protein, ur: 30 mg/dL — AB
Specific Gravity, Urine: 1.031 — ABNORMAL HIGH (ref 1.005–1.030)
pH: 5 (ref 5.0–8.0)

## 2023-04-18 LAB — CBC WITH DIFFERENTIAL/PLATELET
Abs Immature Granulocytes: 0.05 10*3/uL (ref 0.00–0.07)
Basophils Absolute: 0 10*3/uL (ref 0.0–0.1)
Basophils Relative: 0 %
Eosinophils Absolute: 0.1 10*3/uL (ref 0.0–0.5)
Eosinophils Relative: 1 %
HCT: 43.1 % (ref 36.0–46.0)
Hemoglobin: 14.5 g/dL (ref 12.0–15.0)
Immature Granulocytes: 0 %
Lymphocytes Relative: 17 %
Lymphs Abs: 2 10*3/uL (ref 0.7–4.0)
MCH: 31 pg (ref 26.0–34.0)
MCHC: 33.6 g/dL (ref 30.0–36.0)
MCV: 92.1 fL (ref 80.0–100.0)
Monocytes Absolute: 1.1 10*3/uL — ABNORMAL HIGH (ref 0.1–1.0)
Monocytes Relative: 10 %
Neutro Abs: 8.2 10*3/uL — ABNORMAL HIGH (ref 1.7–7.7)
Neutrophils Relative %: 72 %
Platelets: 295 10*3/uL (ref 150–400)
RBC: 4.68 MIL/uL (ref 3.87–5.11)
RDW: 11.9 % (ref 11.5–15.5)
WBC: 11.4 10*3/uL — ABNORMAL HIGH (ref 4.0–10.5)
nRBC: 0 % (ref 0.0–0.2)

## 2023-04-18 LAB — BASIC METABOLIC PANEL
Anion gap: 9 (ref 5–15)
BUN: 17 mg/dL (ref 6–20)
CO2: 25 mmol/L (ref 22–32)
Calcium: 9.1 mg/dL (ref 8.9–10.3)
Chloride: 104 mmol/L (ref 98–111)
Creatinine, Ser: 1.37 mg/dL — ABNORMAL HIGH (ref 0.44–1.00)
GFR, Estimated: 53 mL/min — ABNORMAL LOW (ref >60)
Glucose, Bld: 82 mg/dL (ref 70–99)
Potassium: 3.8 mmol/L (ref 3.5–5.1)
Sodium: 138 mmol/L (ref 135–145)

## 2023-04-18 LAB — POC URINE PREG, ED: Preg Test, Ur: NEGATIVE

## 2023-04-18 MED ORDER — KETOROLAC TROMETHAMINE 30 MG/ML IJ SOLN
15.0000 mg | Freq: Once | INTRAMUSCULAR | Status: AC
  Administered 2023-04-18: 15 mg via INTRAVENOUS
  Filled 2023-04-18: qty 1

## 2023-04-18 MED ORDER — TAMSULOSIN HCL 0.4 MG PO CAPS: 0.4000 mg | ORAL_CAPSULE | Freq: Every day | ORAL | 0 refills | Status: DC

## 2023-04-18 MED ORDER — CEFDINIR 300 MG PO CAPS: 300.0000 mg | ORAL_CAPSULE | Freq: Two times a day (BID) | ORAL | 0 refills | Status: DC

## 2023-04-18 MED ORDER — KETOROLAC TROMETHAMINE 10 MG PO TABS: 10.0000 mg | ORAL_TABLET | Freq: Four times a day (QID) | ORAL | 0 refills | Status: DC | PRN

## 2023-04-18 NOTE — ED Notes (Signed)
See triage note  Presents with some right flank pain and moving into abd  States she was seen on Saturday and dx'd with renal stone  States she has not been able to void

## 2023-04-18 NOTE — ED Provider Notes (Signed)
[facility] Provider Note    Event Date/Time   First MD Initiated Contact with Patient 04/18/23 1107     (approximate)   History   Flank Pain   HPI  Debra Richmond is a 32 y.o. female with history of PCOS, and recent kidney stone presents emergency department with continued right sided flank pain.  States she was told on Saturday she had a 2 mm stone.  Does not feel that it has passed.  Still having a lot of flank pain and nausea.  Some chills but no known fever.  No Diarrhea.  States has very large ovaries and they are not sure why.  Is concerned that maybe they are pressing on the ureter where she cannot get urine out.  Is having some difficulty urinating.      Physical Exam   Triage Vital Signs: ED Triage Vitals [04/18/23 1051]  Encounter Vitals Group     BP 127/77     Systolic BP Percentile      Diastolic BP Percentile      Pulse Rate 78     Resp 17     Temp 98.4 F (36.9 C)     Temp Source Oral     SpO2 98 %     Weight 164 lb 14.5 oz (74.8 kg)     Height 5\' 4"  (1.626 m)     Head Circumference      Peak Flow      Pain Score 8     Pain Loc      Pain Education      Exclude from Growth Chart     Most recent vital signs: Vitals:   04/18/23 1051 04/18/23 1459  BP: 127/77 120/70  Pulse: 78 80  Resp: 17 16  Temp: 98.4 F (36.9 C) 98 F (36.7 C)  SpO2: 98% 98%     General: Awake, no distress.   CV:  Good peripheral perfusion. regular rate and  rhythm Resp:  Normal effort.  Abd:  No distention.   Other:      ED Results / Procedures / Treatments   Labs (all labs ordered are listed, but only abnormal results are displayed) Labs Reviewed  URINALYSIS, ROUTINE W REFLEX MICROSCOPIC - Abnormal; Notable for the following components:      Result Value   Color, Urine YELLOW (*)    APPearance CLOUDY (*)    Specific Gravity, Urine 1.031 (*)    Ketones, ur 20 (*)    Protein, ur 30 (*)    Bacteria, UA RARE (*)    All other components  within normal limits  BASIC METABOLIC PANEL - Abnormal; Notable for the following components:   Creatinine, Ser 1.37 (*)    GFR, Estimated 53 (*)    All other components within normal limits  CBC WITH DIFFERENTIAL/PLATELET - Abnormal; Notable for the following components:   WBC 11.4 (*)    Neutro Abs 8.2 (*)    Monocytes Absolute 1.1 (*)    All other components within normal limits  POC URINE PREG, ED     EKG     RADIOLOGY CT renal stone    PROCEDURES:   Procedures   MEDICATIONS ORDERED IN ED: Medications  ketorolac (TORADOL) 30 MG/ML injection 15 mg (15 mg Intravenous Given 04/18/23 1220)     IMPRESSION / MDM / ASSESSMENT AND PLAN / ED COURSE  I reviewed the triage vital signs and the nursing notes.  Differential diagnosis includes, but is not limited to, kidney stone, pyelonephritis, UTI, ovarian cyst  Patient's presentation is most consistent with acute illness / injury with system symptoms.   UA, POC pregnancy, CT renal stone   Patient has tried several times to urinate.  States her bladder feels full.  Has not been able to void.  Will have nursing staff do in and out cath.  Patient was able to urinate on her own, UA does show some white blood cells that were not present a few days ago  CT renal stone study independently reviewed interpreted by me is continuing to have a 2 mm UVJ stone.  Worsening hydronephrosis per the radiologist  Did explain this to the patient.    Consult to urology, spoke with Dr. Apolinar Junes.  Follow-up if she has not passed stone in 1 week.  I did explain this to the patient.  Encouraged her to follow-up with GYN for the abnormal Pap smear needs to be rechecked.  Follow-up with urology if the stone has not passed 1 week.  I did add Flomax and switched her to Toradol as the anti-inflammatory.  She was also given a prescription for Omnicef due to worsening hydronephrosis along with white blood cells in the  urine.  Patient is in agreement treatment plan.  She was discharged in stable condition.   FINAL CLINICAL IMPRESSION(S) / ED DIAGNOSES   Final diagnoses:  Kidney stone     Rx / DC Orders   ED Discharge Orders          Ordered    ketorolac (TORADOL) 10 MG tablet  Every 6 hours PRN        04/18/23 1554    tamsulosin (FLOMAX) 0.4 MG CAPS capsule  Daily        04/18/23 1554    cefdinir (OMNICEF) 300 MG capsule  2 times daily        04/18/23 1554             Note:  This document was prepared using Dragon voice recognition software and may include unintentional dictation errors.    Faythe Ghee, PA-C 04/18/23 1559    Jene Every, MD 04/21/23 [phone]

## 2023-04-18 NOTE — ED Triage Notes (Addendum)
Pt here with right side flank pain. Pt was here Saturday and informed that she had a stone at the ureter. Pt thinks stone is stuck because she is having continued pain. Pt also endorses N/V but not diarrhea. Pt states she has been taking the Naproxen with no relief.

## 2023-09-01 DIAGNOSIS — F411 Generalized anxiety disorder: Secondary | ICD-10-CM | POA: Diagnosis not present

## 2023-09-01 DIAGNOSIS — F4321 Adjustment disorder with depressed mood: Secondary | ICD-10-CM | POA: Diagnosis not present

## 2023-09-06 DIAGNOSIS — F411 Generalized anxiety disorder: Secondary | ICD-10-CM | POA: Diagnosis not present

## 2023-09-06 DIAGNOSIS — F4321 Adjustment disorder with depressed mood: Secondary | ICD-10-CM | POA: Diagnosis not present

## 2023-09-14 ENCOUNTER — Other Ambulatory Visit: Payer: Self-pay

## 2023-09-14 DIAGNOSIS — E559 Vitamin D deficiency, unspecified: Secondary | ICD-10-CM | POA: Diagnosis not present

## 2023-09-14 DIAGNOSIS — R102 Pelvic and perineal pain: Secondary | ICD-10-CM | POA: Diagnosis not present

## 2023-09-14 DIAGNOSIS — N939 Abnormal uterine and vaginal bleeding, unspecified: Secondary | ICD-10-CM | POA: Diagnosis not present

## 2023-09-14 DIAGNOSIS — N840 Polyp of corpus uteri: Secondary | ICD-10-CM | POA: Diagnosis not present

## 2023-09-14 DIAGNOSIS — E282 Polycystic ovarian syndrome: Secondary | ICD-10-CM | POA: Diagnosis not present

## 2023-09-14 DIAGNOSIS — Z6830 Body mass index (BMI) 30.0-30.9, adult: Secondary | ICD-10-CM | POA: Diagnosis not present

## 2023-09-14 MED ORDER — IBUPROFEN 800 MG PO TABS
800.0000 mg | ORAL_TABLET | Freq: Three times a day (TID) | ORAL | 0 refills | Status: AC
  Filled 2023-09-14: qty 21, 7d supply, fill #0

## 2023-09-14 MED ORDER — ONDANSETRON HCL 4 MG PO TABS
4.0000 mg | ORAL_TABLET | Freq: Three times a day (TID) | ORAL | 0 refills | Status: AC | PRN
  Filled 2023-09-14: qty 20, 7d supply, fill #0

## 2023-09-14 MED ORDER — POLYETHYLENE GLYCOL 3350 17 GM/SCOOP PO POWD
17.0000 g | Freq: Every day | ORAL | 0 refills | Status: AC
  Filled 2023-09-14 – 2023-09-28 (×2): qty 238, 14d supply, fill #0

## 2023-09-14 MED ORDER — OXYCODONE HCL 5 MG PO TABS
5.0000 mg | ORAL_TABLET | Freq: Four times a day (QID) | ORAL | 0 refills | Status: AC | PRN
  Filled 2023-09-14: qty 12, 3d supply, fill #0

## 2023-09-22 ENCOUNTER — Encounter: Payer: Self-pay | Admitting: Obstetrics and Gynecology

## 2023-09-22 ENCOUNTER — Encounter
Admission: RE | Admit: 2023-09-22 | Discharge: 2023-09-22 | Disposition: A | Discharge status: Home / Self Care | Source: Ambulatory Visit | Attending: Obstetrics and Gynecology | Admitting: Obstetrics and Gynecology

## 2023-09-22 ENCOUNTER — Other Ambulatory Visit: Payer: Self-pay

## 2023-09-22 VITALS — Ht 64.0 in | Wt 175.0 lb

## 2023-09-22 DIAGNOSIS — Z01818 Encounter for other preprocedural examination: Secondary | ICD-10-CM

## 2023-09-22 HISTORY — DX: Rectal abscess: K61.1

## 2023-09-22 HISTORY — DX: Vitamin D deficiency, unspecified: E55.9

## 2023-09-22 HISTORY — DX: Pelvic and perineal pain: R10.2

## 2023-09-22 HISTORY — DX: Pelvic and perineal pain unspecified side: R10.20

## 2023-09-22 HISTORY — DX: Abnormal uterine and vaginal bleeding, unspecified: N93.9

## 2023-09-22 HISTORY — DX: Polyp of corpus uteri: N84.0

## 2023-09-22 HISTORY — DX: Other specified abnormal immunological findings in serum: R76.89

## 2023-09-22 HISTORY — DX: Other specified abnormal immunological findings in serum: R76.8

## 2023-09-22 NOTE — Patient Instructions (Signed)
 Your procedure is scheduled on:09-28-23 Wednesday Report to the Registration Desk on the 1st floor of the [facility].Then proceed to the 2nd floor Surgery Desk To find out your arrival time, please call [phone] between 1PM - 3PM on:09-27-23 Tuesday If your arrival time is 6:00 am, do not arrive before that time as the [facility] entrance doors do not open until 6:00 am.  REMEMBER: Instructions that are not followed completely may result in serious medical risk, up to and including death; or upon the discretion of your surgeon and anesthesiologist your surgery may need to be rescheduled.  Do not eat food after midnight the night before surgery.  No gum chewing or hard candies.  You may however, drink CLEAR liquids up to 2 hours before you are scheduled to arrive for your surgery. Do not drink anything within 2 hours of your scheduled arrival time.  Clear liquids include: - water  - apple juice without pulp - gatorade (not RED colors) - black coffee or tea (Do NOT add milk or creamers to the coffee or tea) Do NOT drink anything that is not on this list.  In addition, your doctor has ordered for you to drink the provided:  Ensure Pre Surgery  Drinking this carbohydrate drink up to two hours before surgery helps to reduce insulin resistance and improve patient outcomes. Please complete drinking 2 hours before scheduled arrival time.  One week prior to surgery:Stop NOW (09-22-23) Stop Anti-inflammatories (NSAIDS) such as Advil , Aleve , Ibuprofen , Motrin , Naproxen , Naprosyn  and Aspirin based products such as Excedrin, Goody's Powder, BC Powder. Stop ANY OVER THE COUNTER supplements until after surgery.  You may however, continue to take Tylenol  if needed for pain up until the day of surgery.  Continue taking all of your other prescription medications up until the day of surgery.  Do NOT take any medication the day of surgery  No Alcohol for 24 hours before or after surgery.  No  Smoking including e-cigarettes for 24 hours before surgery.  No chewable tobacco products for at least 6 hours before surgery.  No nicotine patches on the day of surgery.  Do not use any "recreational" drugs for at least a week (preferably 2 weeks) before your surgery.  Please be advised that the combination of cocaine and anesthesia may have negative outcomes, up to and including death. If you test positive for cocaine, your surgery will be cancelled.  On the morning of surgery brush your teeth with toothpaste and water, you may rinse your mouth with mouthwash if you wish. Do not swallow any toothpaste or mouthwash.  Use CHG Soap as directed on instruction sheet.  Do not wear jewelry, make-up, hairpins, clips or nail polish.  For welded (permanent) jewelry: bracelets, anklets, waist bands, etc.  Please have this removed prior to surgery.  If it is not removed, there is a chance that hospital personnel will need to cut it off on the day of surgery.  Do not wear lotions, powders, or perfumes.   Do not shave body hair from the neck down 48 hours before surgery.  Contact lenses, hearing aids and dentures may not be worn into surgery.  Do not bring valuables to the hospital. [facility] is not responsible for any missing/lost belongings or valuables.   Notify your doctor if there is any change in your medical condition (cold, fever, infection).  Wear comfortable clothing (specific to your surgery type) to the hospital.  After surgery, you can help prevent lung complications by  doing breathing exercises.  Take deep breaths and cough every 1-2 hours. Your doctor may order a device called an Incentive Spirometer to help you take deep breaths. When coughing or sneezing, hold a pillow firmly against your incision with both hands. This is called "splinting." Doing this helps protect your incision. It also decreases belly discomfort.  If you are being admitted to the hospital overnight, leave  your suitcase in the car. After surgery it may be brought to your room.  In case of increased patient census, it may be necessary for you, the patient, to continue your postoperative care in the Same Day Surgery department.  If you are being discharged the day of surgery, you will not be allowed to drive home. You will need a responsible individual to drive you home and stay with you for 24 hours after surgery.   If you are taking public transportation, you will need to have a responsible individual with you.  Please call the Pre-admissions Testing Dept. at [phone] if you have any questions about these instructions.  Surgery Visitation Policy:  Patients having surgery or a procedure may have two visitors.  Children under the age of 69 must have an adult with them who is not the patient.     Preparing for Surgery with CHLORHEXIDINE  GLUCONATE (CHG) Soap  Chlorhexidine  Gluconate (CHG) Soap  o An antiseptic cleaner that kills germs and bonds with the skin to continue killing germs even after washing  o Used for showering the night before surgery and morning of surgery  Before surgery, you can play an important role by reducing the number of germs on your skin.  CHG (Chlorhexidine  gluconate) soap is an antiseptic cleanser which kills germs and bonds with the skin to continue killing germs even after washing.  Please do not use if you have an allergy to CHG or antibacterial soaps. If your skin becomes reddened/irritated stop using the CHG.  1. Shower the NIGHT BEFORE SURGERY and the MORNING OF SURGERY with CHG soap.  2. If you choose to wash your hair, wash your hair first as usual with your normal shampoo.  3. After shampooing, rinse your hair and body thoroughly to remove the shampoo.  4. Use CHG as you would any other liquid soap. You can apply CHG directly to the skin and wash gently with a scrungie or a clean washcloth.  5. Apply the CHG soap to your body only from the  neck down. Do not use on open wounds or open sores. Avoid contact with your eyes, ears, mouth, and genitals (private parts). Wash face and genitals (private parts) with your normal soap.  6. Wash thoroughly, paying special attention to the area where your surgery will be performed.  7. Thoroughly rinse your body with warm water.  8. Do not shower/wash with your normal soap after using and rinsing off the CHG soap.  9. Pat yourself dry with a clean towel.  10. Wear clean pajamas to bed the night before surgery.  12. Place clean sheets on your bed the night of your first shower and do not sleep with pets.  13. Shower again with the CHG soap on the day of surgery prior to arriving at the hospital.  14. Do not apply any deodorants/lotions/powders.  15. Please wear clean clothes to the hospital.

## 2023-09-22 NOTE — H&P (Addendum)
 GYN H&P   CC: pre-op   HPI: Debra Richmond 33 y.o. [id number] with a hx of obesity, PCOS, and Vit D deficiency who presents for follow-up of abnormal menses and pelvic pain.     Scheduled for hysteroscopy, D&C, possible Myosure polypectomy, diagnostic laparoscopy, possible excision of endometriosis, and Mirena  IUD insertion for AUB, pelvic pain, and endometrial polyp on 09/28/23.      ROS: All other systems reviewed and negative   PMHx: Past Medical History  Diagnosis Date   Abdominal pain      Intermittent & takes tramadol  to help w/ this PRN   ANA positive 2019   Anxiety     Gastritis 03/26/2015   History of abnormal cervical Pap smear     Miscarriage (HHS-HCC) 09/14, 01/15   MRSA (methicillin resistant Staphylococcus aureus)      Multiple skin abscesses requiring I&D.   PCOS (polycystic ovarian syndrome)     Perirectal abscess 12/2015   Reflux esophagitis 08/14/2012    w/ focal area of intestinal metaplasia c/w Barrett's esophagus.   Right shoulder tendinitis     Seborrheic dermatitis of scalp      Occipital scalp   Torn rotator cuff     Vitamin D deficiency 08/2019    Vit D= 23.7        PSHx: Past Surgical History  Procedure Laterality Date   COLONOSCOPY   08/14/2012   CHOLECYSTECTOMY   10/19/2012   EGD   03/26/2015    Gastritis/Mild Esophagitis/No Repeat/MGR    Arthroscopic debridement of symptomatic medial plica, open repair of MCL with internal brace augmentation, left knee Left 10/27/2021    Dr.Poggi   Removal of surgical hardware from medial femoral condyle and medial tibia, left knee Left 06/02/2022    Dr.Poggi   EGD   08/14/12, 08/03/13   wisdom teeth            OBHx: Gravida Para Term Preterm AB Living  2       2 0  SAB IAB Ectopic Molar Multiple Live Births   2               # Outcome Date GA Lbr Len/2nd Weight Sex Type Anes PTL Lv  2 SAB                    1 SAB                        GYN Hx: - LMP: Patient's last menstrual period was  08/01/2023 (approximate).          - Menarche: Age 63 - Menses: Reports bleeding for 7 days every 14-90 (irregular intervals), 4 days are heavy (changing soaked tampon q1h), +significant associated cramping.  - Pap hx: +history of abnormals (LSIL in 2016 > colpo benign), last 05/20/23 NILM with neg HRHPV. Never had any excisional procedures - STI hx: +HPV - Sexual preference: Sexually active with wife - IPV screening: Feels safe at home/ in relationship - Dyspareunia or sexual concerns: pain during climax - Contraceptive method: none - Fertility desires: does not plan to get pregnancy - HPV vaccination: has not received - Abdominal surgeries: chole - GYN procedures: none     FHx: Denies FHx of ovarian, breast, uterine, cervical, and colon cancer Mom and twin sister have endometriosis   Meds: Medications Ordered Prior to Encounter  Medication Sig   escitalopram  oxalate (LEXAPRO ) 10 MG tablet Take 1 tablet (10 mg total) by  mouth once daily NEEDS AN APPOINTMENT FOR FURTHER REFILLS   naproxen  (NAPROSYN ) 500 MG tablet Take 800 mg by mouth once daily as needed   acetaminophen  (TYLENOL  ORAL) Take 200 mg by mouth once as needed (Patient not taking: Reported on 06/21/2023)   hydrOXYzine  HCL (ATARAX ) 10 MG tablet Take 1 tablet (10 mg total) by mouth 3 (three) times daily as needed for Anxiety (Patient not taking: Reported on 05/06/2023)   NAPROXEN  ORAL Take 800 mg by mouth once as needed (Patient not taking: Reported on 05/06/2023)   tamsulosin  (FLOMAX ) 0.4 mg capsule Take 0.4 mg by mouth once daily (Patient not taking: Reported on 09/14/2023)         Allergies: Allergies  Allergen Reactions   Clindamycin  Other (See Comments)      Chest tighteness   Dilaudid [Hydromorphone (Bulk)] Hives   Doxycycline  Nausea and Vomiting   Garlic Hives   Prednisone  Other (See Comments)      Disorientation   Sulfa (Sulfonamide Antibiotics) Nausea and Vomiting        SocHx: Social History         OBJECTIVE: BP 114/77   Pulse 75   Ht 162.6 cm (5\' 4" )   Wt 79.3 kg (174 lb 12.8 oz)   LMP 08/01/2023 (Approximate)   BMI 30.00 kg/m    Gen: NAD HEENT: [place]/AT Heart: Regular rate Lungs: Normal work of breathing Ext: No BLE edema   Endovaginal Imaging- 06/20/23 ================   Indication: Endovaginal imaging was necessary to evaluate the uterus and ovaries. Probe: [id number].   Uterus ======   Visualized. Size 73 mm x 46 mm x 38 mm Normal Position: anteverted Malformations: none Myometrium: appears normal Endometrium: thickened with possible polyp. Endometrial thickness, total 13.7 mm Polyp(s)   Size 15 mm x 11 mm x 15 mm. Mean 13.7 mm. Smooth   Right Ovary =========   Visualized, Enlarged. Outline: Normal. Morphology: Appropriate. Size 39 mm x 29 mm x 27 mm No cysts identified Follicles identified   Left Ovary ========   Visualized, Enlarged. Outline: Normal. Morphology: Appropriate. Size 42 mm x 21 mm x 28 mm No cysts identified Follicles identified   Cul de Sac =========   Normal. No free fluid visualized   Impression =========   Endovaginal ultrasound performed today due to the indications outlined above.   The sonogram reveals a normal appearing anteverted to retroverted uterus.   The endometrium appears thickened with possible endometrial polyp measuring 15 x 11 x 15mm.   The ovaries were visualized bilaterally and appear enlarged with volumes of 70ml(Right) and 12.40ml(Left)   There are no unusual adnexal findings.   There is no free fluid.   ASSESSMENT/PLAN: Debra Richmond 33 y.o. [id number] with a hx of obesity, PCOS, and Vit D deficiency who presents for follow-up of abnormal menses and pelvic pain.     #AUB #Pelvic pain #Endometrial polyp - Pelvic ultrasound showed possible endometrial polyp - Pelvic exam with tenderness in multiple areas - Given family history and pain, highly suspicious for endometriosis.  - Discussed option for also  performed a diagnostic laparoscopy to evaluate for endometriosis and possible excision of endometriosis. Plan to remove or cauterize any endometriosis implants possible. Discussed possibility of ovarian cysts and plan to remove if present. Patient wants to do this at the same time.  - Scheduled for hysteroscopy, D&C, possible Myosure polypectomy, diagnostic laparoscopy, possible excision of endometriosis, and Mirena  IUD insertion for AUB, pelvic pain, and endometrial polyp - Discussed planned procedure, risks, and  benefits. Risks such as infection, bleeding, organ damage (including uterine perforation), anesthesia complications, need to convert to laparotomy, and need for possible blood products were discussed; patient will accept products in case of an emergency. We discussed the risks of a blood transfusion, such as a 1 in 1.2-1.4 million chance of contracting HIV, Hep C. Discussed possibility of needing to perform a laparoscopic procedure in the case of uterine perforation.   - Surgical and blood consent signed - Rx tylenol , ibuprofen , oxycodone  q4h PRN x12 tab, Miralax  daily x 7d, and zofran  PRN. Sent to pharmacy.  - Referred for pelvic floor PT previously    Ammon Muscatello Jimmy Moulding, MD

## 2023-09-26 ENCOUNTER — Encounter
Admission: RE | Admit: 2023-09-26 | Discharge: 2023-09-26 | Disposition: A | Discharge status: Home / Self Care | Source: Ambulatory Visit | Attending: Obstetrics and Gynecology | Admitting: Obstetrics and Gynecology

## 2023-09-26 DIAGNOSIS — Z01812 Encounter for preprocedural laboratory examination: Secondary | ICD-10-CM | POA: Diagnosis not present

## 2023-09-26 DIAGNOSIS — Z01818 Encounter for other preprocedural examination: Secondary | ICD-10-CM | POA: Diagnosis present

## 2023-09-26 LAB — CBC
HCT: 39.3 % (ref 36.0–46.0)
Hemoglobin: 13.4 g/dL (ref 12.0–15.0)
MCH: 30.7 pg (ref 26.0–34.0)
MCHC: 34.1 g/dL (ref 30.0–36.0)
MCV: 90.1 fL (ref 80.0–100.0)
Platelets: 304 10*3/uL (ref 150–400)
RBC: 4.36 MIL/uL (ref 3.87–5.11)
RDW: 12.6 % (ref 11.5–15.5)
WBC: 7 10*3/uL (ref 4.0–10.5)
nRBC: 0 % (ref 0.0–0.2)

## 2023-09-26 LAB — TYPE AND SCREEN
ABO/RH(D): O NEG
Antibody Screen: NEGATIVE

## 2023-09-28 ENCOUNTER — Ambulatory Visit
Admission: RE | Admit: 2023-09-28 | Discharge: 2023-09-28 | Disposition: A | Discharge status: Home / Self Care | Attending: Obstetrics and Gynecology | Admitting: Obstetrics and Gynecology

## 2023-09-28 ENCOUNTER — Other Ambulatory Visit: Payer: Self-pay

## 2023-09-28 ENCOUNTER — Ambulatory Visit: Payer: Self-pay | Admitting: Urgent Care

## 2023-09-28 ENCOUNTER — Ambulatory Visit: Admitting: Anesthesiology

## 2023-09-28 ENCOUNTER — Encounter
Admission: RE | Disposition: A | Discharge status: Home / Self Care | Payer: Self-pay | Source: Home / Self Care | Attending: Obstetrics and Gynecology

## 2023-09-28 ENCOUNTER — Encounter: Payer: Self-pay | Admitting: Obstetrics and Gynecology

## 2023-09-28 DIAGNOSIS — R102 Pelvic and perineal pain: Secondary | ICD-10-CM | POA: Insufficient documentation

## 2023-09-28 DIAGNOSIS — K219 Gastro-esophageal reflux disease without esophagitis: Secondary | ICD-10-CM | POA: Insufficient documentation

## 2023-09-28 DIAGNOSIS — Z30430 Encounter for insertion of intrauterine contraceptive device: Secondary | ICD-10-CM | POA: Diagnosis not present

## 2023-09-28 DIAGNOSIS — N939 Abnormal uterine and vaginal bleeding, unspecified: Secondary | ICD-10-CM | POA: Insufficient documentation

## 2023-09-28 DIAGNOSIS — N840 Polyp of corpus uteri: Secondary | ICD-10-CM | POA: Insufficient documentation

## 2023-09-28 DIAGNOSIS — Z01818 Encounter for other preprocedural examination: Secondary | ICD-10-CM

## 2023-09-28 DIAGNOSIS — Z87891 Personal history of nicotine dependence: Secondary | ICD-10-CM | POA: Diagnosis not present

## 2023-09-28 HISTORY — PX: CERVICAL POLYPECTOMY: SHX88

## 2023-09-28 HISTORY — PX: LAPAROSCOPY: SHX197

## 2023-09-28 HISTORY — PX: INTRAUTERINE DEVICE (IUD) INSERTION: SHX5877

## 2023-09-28 HISTORY — PX: HYSTEROSCOPY WITH D & C: SHX1775

## 2023-09-28 LAB — ABO/RH: ABO/RH(D): O NEG

## 2023-09-28 LAB — POCT PREGNANCY, URINE: Preg Test, Ur: NEGATIVE

## 2023-09-28 SURGERY — DILATATION AND CURETTAGE /HYSTEROSCOPY
Anesthesia: General | Site: Pelvis

## 2023-09-28 MED ORDER — SUCCINYLCHOLINE CHLORIDE 200 MG/10ML IV SOSY
PREFILLED_SYRINGE | INTRAVENOUS | Status: DC | PRN
  Administered 2023-09-28: 100 mg via INTRAVENOUS

## 2023-09-28 MED ORDER — CHLORHEXIDINE GLUCONATE 0.12 % MT SOLN
15.0000 mL | Freq: Once | OROMUCOSAL | Status: AC
  Administered 2023-09-28: 15 mL via OROMUCOSAL

## 2023-09-28 MED ORDER — ACETAMINOPHEN 500 MG PO TABS
1000.0000 mg | ORAL_TABLET | ORAL | Status: AC
  Administered 2023-09-28: 1000 mg via ORAL

## 2023-09-28 MED ORDER — LIDOCAINE-EPINEPHRINE 1 %-1:100000 IJ SOLN
INTRAMUSCULAR | Status: AC
  Filled 2023-09-28: qty 1

## 2023-09-28 MED ORDER — FENTANYL CITRATE (PF) 100 MCG/2ML IJ SOLN
INTRAMUSCULAR | Status: AC
  Filled 2023-09-28: qty 2

## 2023-09-28 MED ORDER — MONSELS FERRIC SUBSULFATE EX SOLN
CUTANEOUS | Status: AC
  Filled 2023-09-28: qty 8

## 2023-09-28 MED ORDER — FENTANYL CITRATE (PF) 100 MCG/2ML IJ SOLN
25.0000 ug | INTRAMUSCULAR | Status: DC | PRN
  Administered 2023-09-28 (×2): 25 ug via INTRAVENOUS

## 2023-09-28 MED ORDER — SUGAMMADEX SODIUM 200 MG/2ML IV SOLN
INTRAVENOUS | Status: DC | PRN
  Administered 2023-09-28: 400 mg via INTRAVENOUS

## 2023-09-28 MED ORDER — OXYCODONE HCL 5 MG PO TABS
ORAL_TABLET | ORAL | Status: AC
Start: 2023-09-28 — End: ?
  Filled 2023-09-28: qty 1

## 2023-09-28 MED ORDER — PHENYLEPHRINE 80 MCG/ML (10ML) SYRINGE FOR IV PUSH (FOR BLOOD PRESSURE SUPPORT)
PREFILLED_SYRINGE | INTRAVENOUS | Status: DC | PRN
  Administered 2023-09-28: 80 ug via INTRAVENOUS
  Administered 2023-09-28: 160 ug via INTRAVENOUS

## 2023-09-28 MED ORDER — CHLORHEXIDINE GLUCONATE 0.12 % MT SOLN
OROMUCOSAL | Status: AC
  Filled 2023-09-28: qty 15

## 2023-09-28 MED ORDER — PROPOFOL 1000 MG/100ML IV EMUL
INTRAVENOUS | Status: AC
  Filled 2023-09-28: qty 100

## 2023-09-28 MED ORDER — DEXAMETHASONE SODIUM PHOSPHATE 10 MG/ML IJ SOLN
INTRAMUSCULAR | Status: DC | PRN
  Administered 2023-09-28: 10 mg via INTRAVENOUS

## 2023-09-28 MED ORDER — SODIUM CHLORIDE 0.9 % IR SOLN
Status: DC | PRN
  Administered 2023-09-28: 1

## 2023-09-28 MED ORDER — OXYCODONE HCL 5 MG/5ML PO SOLN: 5.0000 mg | Freq: Once | ORAL | Status: AC | PRN

## 2023-09-28 MED ORDER — LEVONORGESTREL 20 MCG/DAY IU IUD
1.0000 | INTRAUTERINE_SYSTEM | INTRAUTERINE | Status: AC
  Administered 2023-09-28: 1 via INTRAUTERINE

## 2023-09-28 MED ORDER — ONDANSETRON HCL 4 MG/2ML IJ SOLN
INTRAMUSCULAR | Status: DC | PRN
  Administered 2023-09-28 (×2): 4 mg via INTRAVENOUS

## 2023-09-28 MED ORDER — MIDAZOLAM HCL 2 MG/2ML IJ SOLN
INTRAMUSCULAR | Status: DC | PRN
  Administered 2023-09-28: 2 mg via INTRAVENOUS

## 2023-09-28 MED ORDER — ACETAMINOPHEN 500 MG PO TABS
ORAL_TABLET | ORAL | Status: AC
  Filled 2023-09-28: qty 2

## 2023-09-28 MED ORDER — LEVONORGESTREL 20 MCG/DAY IU IUD
INTRAUTERINE_SYSTEM | INTRAUTERINE | Status: AC
  Filled 2023-09-28: qty 1

## 2023-09-28 MED ORDER — ROCURONIUM BROMIDE 100 MG/10ML IV SOLN
INTRAVENOUS | Status: DC | PRN
  Administered 2023-09-28: 10 mg via INTRAVENOUS
  Administered 2023-09-28 (×2): 40 mg via INTRAVENOUS

## 2023-09-28 MED ORDER — GLYCOPYRROLATE 0.2 MG/ML IJ SOLN
INTRAMUSCULAR | Status: DC | PRN
  Administered 2023-09-28: .2 mg via INTRAVENOUS

## 2023-09-28 MED ORDER — BUPIVACAINE HCL (PF) 0.5 % IJ SOLN
INTRAMUSCULAR | Status: AC
  Filled 2023-09-28: qty 30

## 2023-09-28 MED ORDER — LIDOCAINE-EPINEPHRINE 1 %-1:100000 IJ SOLN
INTRAMUSCULAR | Status: DC | PRN
  Administered 2023-09-28: 10 mL

## 2023-09-28 MED ORDER — SODIUM CHLORIDE 0.9 % IV SOLN: INTRAVENOUS | Status: DC

## 2023-09-28 MED ORDER — LIDOCAINE HCL (CARDIAC) PF 100 MG/5ML IV SOSY
PREFILLED_SYRINGE | INTRAVENOUS | Status: DC | PRN
  Administered 2023-09-28: 100 mg via INTRAVENOUS

## 2023-09-28 MED ORDER — FENTANYL CITRATE (PF) 100 MCG/2ML IJ SOLN
INTRAMUSCULAR | Status: DC | PRN
  Administered 2023-09-28 (×2): 50 ug via INTRAVENOUS
  Administered 2023-09-28: 100 ug via INTRAVENOUS

## 2023-09-28 MED ORDER — MIDAZOLAM HCL 2 MG/2ML IJ SOLN
INTRAMUSCULAR | Status: AC
  Filled 2023-09-28: qty 2

## 2023-09-28 MED ORDER — LACTATED RINGERS IV SOLN: INTRAVENOUS | Status: DC

## 2023-09-28 MED ORDER — DEXMEDETOMIDINE HCL IN NACL 200 MCG/50ML IV SOLN
INTRAVENOUS | Status: DC | PRN
  Administered 2023-09-28: 8 ug via INTRAVENOUS
  Administered 2023-09-28: 12 ug via INTRAVENOUS

## 2023-09-28 MED ORDER — ORAL CARE MOUTH RINSE: 15.0000 mL | Freq: Once | OROMUCOSAL | Status: AC

## 2023-09-28 MED ORDER — SODIUM CHLORIDE 0.9 % IR SOLN
Status: DC | PRN
  Administered 2023-09-28: 1500 mL

## 2023-09-28 MED ORDER — POVIDONE-IODINE 10 % EX SWAB
2.0000 | Freq: Once | CUTANEOUS | Status: AC
  Administered 2023-09-28: 2 via TOPICAL

## 2023-09-28 MED ORDER — PROPOFOL 10 MG/ML IV BOLUS
INTRAVENOUS | Status: DC | PRN
  Administered 2023-09-28: 200 mg via INTRAVENOUS
  Administered 2023-09-28: 100 mg via INTRAVENOUS

## 2023-09-28 MED ORDER — OXYCODONE HCL 5 MG PO TABS
5.0000 mg | ORAL_TABLET | Freq: Once | ORAL | Status: AC | PRN
  Administered 2023-09-28: 5 mg via ORAL

## 2023-09-28 MED ORDER — PROPOFOL 500 MG/50ML IV EMUL
INTRAVENOUS | Status: DC | PRN
  Administered 2023-09-28: 165 ug/kg/min via INTRAVENOUS

## 2023-09-28 SURGICAL SUPPLY — 51 items
APPLICATOR COTTON TIP 6 STRL (MISCELLANEOUS) IMPLANT
APPLICATOR COTTON TIP 6IN STRL (MISCELLANEOUS) IMPLANT
APPLICATOR SWAB PROCTO LG 16IN (MISCELLANEOUS) ×2 IMPLANT
BAG URINE DRAIN 2000ML AR STRL (UROLOGICAL SUPPLIES) IMPLANT
BENZOIN TINCTURE PRP APPL 2/3 (GAUZE/BANDAGES/DRESSINGS) ×2 IMPLANT
BLADE SURG SZ11 CARB STEEL (BLADE) ×2 IMPLANT
CATH URTH 16FR FL 2W BLN LF (CATHETERS) ×2 IMPLANT
DEVICE MYOSURE REACH (MISCELLANEOUS) IMPLANT
DRSG TEGADERM 2-3/8X2-3/4 SM (GAUZE/BANDAGES/DRESSINGS) ×2 IMPLANT
DRSG TELFA 3X8 NADH STRL (GAUZE/BANDAGES/DRESSINGS) IMPLANT
GAUZE 4X4 16PLY ~~LOC~~+RFID DBL (SPONGE) ×2 IMPLANT
GAUZE SPONGE 2X2 STRL 8-PLY (GAUZE/BANDAGES/DRESSINGS) ×2 IMPLANT
GLOVE SURG SYN 6.5 ES PF (GLOVE) ×4 IMPLANT
GLOVE SURG SYN 6.5 PF PI (GLOVE) ×4 IMPLANT
GOWN STRL REUS W/ TWL LRG LVL3 (GOWN DISPOSABLE) ×4 IMPLANT
GRASPER SUT TROCAR 14GX15 (MISCELLANEOUS) ×2 IMPLANT
IRRIGATION STRYKERFLOW (MISCELLANEOUS) ×2 IMPLANT
IV NS 1000ML BAXH (IV SOLUTION) ×2 IMPLANT
KIT PINK PAD W/HEAD ARE REST (MISCELLANEOUS) ×2 IMPLANT
KIT PINK PAD W/HEAD ARM REST (MISCELLANEOUS) ×2 IMPLANT
KIT PROCEDURE FLUENT (KITS) IMPLANT
KIT TURNOVER CYSTO (KITS) ×2 IMPLANT
KITTNER LAPARASCOPIC 5X40 (MISCELLANEOUS) IMPLANT
LABEL OR SOLS (LABEL) ×2 IMPLANT
MANIFOLD NEPTUNE II (INSTRUMENTS) ×2 IMPLANT
Mirena IMPLANT
NS IRRIG 500ML POUR BTL (IV SOLUTION) ×2 IMPLANT
PACK DNC HYST (MISCELLANEOUS) ×2 IMPLANT
PACK GYN LAPAROSCOPIC (MISCELLANEOUS) ×2 IMPLANT
PAD PREP OB/GYN DISP 24X41 (PERSONAL CARE ITEMS) ×2 IMPLANT
SCISSORS METZENBAUM CVD 33 (INSTRUMENTS) IMPLANT
SCRUB CHG 4% DYNA-HEX 4OZ (MISCELLANEOUS) ×2 IMPLANT
SEAL ROD LENS SCOPE MYOSURE (ABLATOR) ×2 IMPLANT
SET CYSTO W/LG BORE CLAMP LF (SET/KITS/TRAYS/PACK) IMPLANT
SET TUBE SMOKE EVAC HIGH FLOW (TUBING) ×2 IMPLANT
SHEARS HARMONIC 36 ACE (MISCELLANEOUS) ×2 IMPLANT
SLEEVE Z-THREAD 5X100MM (TROCAR) ×2 IMPLANT
SOL .9 NS 3000ML IRR UROMATIC (IV SOLUTION) ×2 IMPLANT
STRIP CLOSURE SKIN 1/4X4 (GAUZE/BANDAGES/DRESSINGS) ×2 IMPLANT
SUT VIC AB 0 CT1 36 (SUTURE) ×2 IMPLANT
SUT VIC AB 2-0 UR6 27 (SUTURE) IMPLANT
SUT VIC AB 4-0 SH 27XANBCTRL (SUTURE) ×2 IMPLANT
SUTURE MNCRL 4-0 27XMF (SUTURE) IMPLANT
SYR 50ML LL SCALE MARK (SYRINGE) IMPLANT
SYR CONTROL 10ML LL (SYRINGE) ×2 IMPLANT
SYSTEM BAG RETRIEVAL 10MM (BASKET) ×2 IMPLANT
TOWEL OR 17X26 4PK STRL BLUE (TOWEL DISPOSABLE) ×2 IMPLANT
TRAP FLUID SMOKE EVACUATOR (MISCELLANEOUS) ×2 IMPLANT
TROCAR Z-THRD FIOS HNDL 11X100 (TROCAR) ×2 IMPLANT
TROCAR Z-THREAD FIOS 5X100MM (TROCAR) ×2 IMPLANT
TUBING CONNECTING 10 (TUBING) IMPLANT

## 2023-09-28 NOTE — Anesthesia Procedure Notes (Signed)
 Procedure Name: Intubation Date/Time: 09/28/2023 10:18 AM  Performed by: Niki Barter, CRNAPre-anesthesia Checklist: Patient identified, Emergency Drugs available, Suction available and Patient being monitored Patient Re-evaluated:Patient Re-evaluated prior to induction Oxygen Delivery Method: Circle system utilized Preoxygenation: Pre-oxygenation with 100% oxygen Induction Type: IV induction Ventilation: Mask ventilation without difficulty Laryngoscope Size: McGrath and 3 Grade View: Grade I Tube type: Oral Tube size: 6.5 mm Number of attempts: 1 Airway Equipment and Method: Stylet Placement Confirmation: ETT inserted through vocal cords under direct vision, positive ETCO2 and breath sounds checked- equal and bilateral Secured at: 21 cm Tube secured with: Tape Dental Injury: Teeth and Oropharynx as per pre-operative assessment

## 2023-09-28 NOTE — Op Note (Addendum)
 Operative Note  Name: Debra Richmond MRN: [id number] Date: 09/28/23     Preoperative diagnosis: Abnormal uterine bleeding, pelvic pain, endometrial polyp Postoperative diagnosis: Abnormal uterine bleeding, pelvic pain, endometrial polyp, pelvic venous congestion   Procedure: Hysteroscopy, Dilation & Curettage, Myosure Polypectomy, Mirena IUD insertion, Diagnotic laparoscopy  Surgeon: Everlena Hoard, MD Assistant: Mozell Arias, RNFA Anesthesia: General    EBL: 10cc  IVF: 600cc UOP: 50cc Fluid deficit: 140cc   Complications: none  Specimens: endometrial polyp Findings: Anteverted uterus that was sounded to 9cm, nulliparous cervix, normal appearing endocervical canal, uterine cavity with large endometrial polyp arising from the left anterior wall but otherwise normal appearing. Bilateral ostia visualized. No intra-abdominal adhesions. Liver edge and stomach edge normal appearing. Bowel, peritoneum, and omentum normal to the extent seen. Appendix normal appearing. Uterus with 0.5 cm fundal fibroid but otherwise normal appearing. Bilateral pelvic venous congestion. Normal appearing ovaries and fallopian tubes bilaterally. No endometriosis visually throughout including in the posterior cul-de-sac, the uterosacral ligaments, the bilateral ovarian fossa, the lower uterine segment, and the anterior cul-de-sac    Procedure:  The risks, benefits, indications, and alternative of the procedure were reviewed with the patient in pre-operative area and informed consent was obtained. The patient was taken to the operating room with IV in place. General anesthesia was administered. She was placed in the dorsal lithotomy position, prepped and draped in the usual sterile fashion. A red rubber catheter was used to drain the bladder. A sterile speculum was placed in the patient's vagina and the cervix visualized. A single tooth tenaculum was placed on the anterior lip of the cervix. Paracervical block  performed by injecting a total of 10cc of 1% lidocaine  with epinephrine  into the vaginal fornices at 4 and 8 o'clock (care was taken to pull back prior to injection). The uterus was then gently sounded to 9 cm. The cervix was dilated to allow for hysteroscope using Pratt dilators. The hysteroscopy was done with normal saline with the above findings were noted. The Myosure was used to remove endometrial polyp. The hysteroscopy was then removed. The Mirena IUD was inserted to the fundus, deployed, and the applicator was removed.  The strings were cut 3 cm from the external cervical os. Tenaculum was removed and hemostasis was noted. Specimen was sent to pathology. Vaginal instruments were removed. Hemostasis noted.     After changing gown and gloves, attention was turned to the abdomen. Each intended injection site was injected with 3cc of 1% lidocaine  with epinephrine  prior to incision. A 5 mm horizontal skin incision was made in the umbilicus and a 5 mm trocar was inserted under direct camera visualization. A pneumoperitoneum was achieved with CO2. A 5 mm trocar was placed in the left lower quadrant and a 5 mm trocar was placed in the right lower quadrant under direct visualization. 30cc of serosanginous free fluid was noted in the posterior cul-de-sac and suctioned. The pelvis was irrigated with 50cc of NS. No evidence of endometriosis- above findings noted. Hemostasis was assured and the trocars in the right and left lower quadrants were removed under direct visualization. The umbilical trocar was then removed. The skin was re-approximated with 4-0 Monocryl in a running subcuticular fashion. Hemostasis was noted. Steri-strips and Tegaderm dressing was applied.   Patient tolerated the procedure well. Instrument counts were correct x 2. Patient was awake and brought to the recovery room in stable condition.    Vita Grip, MD, 09/28/2023, 11:42 AM

## 2023-09-28 NOTE — Interval H&P Note (Signed)
 History and Physical Interval Note:  09/28/2023 9:22 AM  Acura Disalvo  has presented today for surgery, with the diagnosis of Abnormal uterine bleeding pelvic pain,.  The various methods of treatment have been discussed with the patient and family. After consideration of risks, benefits and other options for treatment, the patient has consented to  Procedure(s) with comments: DILATATION AND CURETTAGE /HYSTEROSCOPY (N/A) - MYOSURE POLYPECTOMY, CERVIX (N/A) INSERTION, INTRAUTERINE DEVICE (N/A) - MIRENA LAPAROSCOPY, DIAGNOSTIC (N/A) EXCISION, ENDOMETRIOSIS, LAPAROSCOPIC (N/A) as a surgical intervention.  The patient's history has been reviewed, patient examined, no change in status, stable for surgery.  I have reviewed the patient's chart and labs.  Questions were answered to the patient's satisfaction.     Denzell Colasanti V [facility]

## 2023-09-28 NOTE — Anesthesia Postprocedure Evaluation (Signed)
 Anesthesia Post Note  Patient: Wavie Kadish  Procedure(s) Performed: DILATATION AND CURETTAGE /HYSTEROSCOPY (Cervix) POLYPECTOMY, CERVIX (Cervix) INSERTION, INTRAUTERINE DEVICE (Cervix) LAPAROSCOPY, DIAGNOSTIC (Pelvis)  Patient location during evaluation: PACU Anesthesia Type: General Level of consciousness: awake and alert Pain management: pain level controlled Vital Signs Assessment: post-procedure vital signs reviewed and stable Respiratory status: spontaneous breathing, nonlabored ventilation, respiratory function stable and patient connected to nasal cannula oxygen Cardiovascular status: blood pressure returned to baseline and stable Postop Assessment: no apparent nausea or vomiting Anesthetic complications: no   No notable events documented.   Last Vitals:  Vitals:   09/28/23 1242 09/28/23 1245  BP:  115/65  Pulse: 80 78  Resp: 18 16  Temp:    SpO2: 99% 99%    Last Pain:  Vitals:   09/28/23 1249  TempSrc:   PainSc: 5                  Portia Brittle Audra Bellard

## 2023-09-28 NOTE — Transfer of Care (Signed)
 Immediate Anesthesia Transfer of Care Note  Patient: Debra Richmond  Procedure(s) Performed: DILATATION AND CURETTAGE /HYSTEROSCOPY (Cervix) POLYPECTOMY, CERVIX (Cervix) INSERTION, INTRAUTERINE DEVICE (Cervix) LAPAROSCOPY, DIAGNOSTIC (Pelvis)  Patient Location: PACU  Anesthesia Type:General  Level of Consciousness: drowsy and patient cooperative  Airway & Oxygen Therapy: Patient Spontanous Breathing and Patient connected to face mask oxygen  Post-op Assessment: Report given to RN and Post -op Vital signs reviewed and stable  Post vital signs: Reviewed and stable  Last Vitals:  Vitals Value Taken Time  BP 95/42 09/28/23 1154  Temp    Pulse 62 09/28/23 1156  Resp 21 09/28/23 1156  SpO2 98 % 09/28/23 1156  Vitals shown include unfiled device data.  Last Pain:  Vitals:   09/28/23 0914  TempSrc: Temporal  PainSc: 2          Complications: No notable events documented.

## 2023-09-28 NOTE — Anesthesia Preprocedure Evaluation (Signed)
 Anesthesia Evaluation  Patient identified by MRN, date of birth, ID band Patient awake    Reviewed: Allergy & Precautions, NPO status , Patient's Chart, lab work & pertinent test results  History of Anesthesia Complications (+) PONV, Family history of anesthesia reaction and history of anesthetic complications  Airway Mallampati: III  TM Distance: >3 FB Neck ROM: full    Dental  (+) Chipped   Pulmonary neg shortness of breath, sleep apnea , former smoker   Pulmonary exam normal        Cardiovascular Exercise Tolerance: Good (-) angina (-) Past MI and (-) DOE negative cardio ROS Normal cardiovascular exam     Neuro/Psych   Anxiety     negative neurological ROS     GI/Hepatic Neg liver ROS,GERD  Controlled,,  Endo/Other  negative endocrine ROS    Renal/GU      Musculoskeletal   Abdominal   Peds  Hematology negative hematology ROS (+)   Anesthesia Other Findings Past Medical History: No date: Abnormal uterine bleeding (AUB) No date: ANA positive No date: Anemia No date: Anxiety No date: Claustrophobia No date: Complication of anesthesia     Comment:  pt states she becomes very defiant after surgery No date: Endometrial polyp No date: Family history of adverse reaction to anesthesia     Comment:  twin sister- n/v No date: GERD (gastroesophageal reflux disease) No date: History of kidney stones 2021: History of methicillin resistant staphylococcus aureus (MRSA) No date: PCOD (polycystic ovarian disease) No date: Pelvic pain No date: Perirectal abscess No date: PONV (postoperative nausea and vomiting) No date: Rotator cuff disorder     Comment:  right shoulder No date: Vitamin D deficiency  Past Surgical History: 10/19/2012: CHOLECYSTECTOMY 06/02/2022: HARDWARE REMOVAL; Left     Comment:  Procedure: REMOVAL OF PAINFUL RETAINED SURGICAL HARDWARE              OF LEFT KNEE.;  Surgeon: Elner Hahn, MD;   Location:               [facility];  Service: Orthopedics;  Laterality: Left; 10/27/2021: KNEE ARTHROSCOPY WITH ANTERIOR CRUCIATE LIGAMENT (ACL)  REPAIR; Left     Comment:  Procedure: LEFT KNEE ARTHROSCOPY WITH DEBRIDEMENT AND               REPAIR/RECONSTRUCTION OF HER MEDIAL COLLATERAL LIGAMENT;               Surgeon: Elner Hahn, MD;  Location: [facility];                Service: Orthopedics;  Laterality: Left; 04/29/2017: WISDOM TOOTH EXTRACTION; Bilateral     Comment:  upper  BMI    Body Mass Index: 30.04 kg/m      Reproductive/Obstetrics negative OB ROS                             Anesthesia Physical Anesthesia Plan  ASA: 2  Anesthesia Plan: General ETT   Post-op Pain Management:    Induction: Intravenous  PONV Risk Score and Plan: Ondansetron , Dexamethasone , Midazolam  and Treatment may vary due to age or medical condition  Airway Management Planned: Oral ETT  Additional Equipment:   Intra-op Plan:   Post-operative Plan: Extubation in OR  Informed Consent: I have reviewed the patients History and Physical, chart, labs and discussed the procedure including the risks, benefits and alternatives for the proposed anesthesia with the patient or authorized representative  who has indicated his/her understanding and acceptance.     Dental Advisory Given  Plan Discussed with: Anesthesiologist, CRNA and Surgeon  Anesthesia Plan Comments: (Patient consented for risks of anesthesia including but not limited to:  - adverse reactions to medications - damage to eyes, teeth, lips or other oral mucosa - nerve damage due to positioning  - sore throat or hoarseness - Damage to heart, brain, nerves, lungs, other parts of body or loss of life  Patient voiced understanding and assent.)       Anesthesia Quick Evaluation

## 2023-09-29 ENCOUNTER — Encounter: Payer: Self-pay | Admitting: Obstetrics and Gynecology

## 2023-09-29 LAB — SURGICAL PATHOLOGY

## 2023-10-05 ENCOUNTER — Other Ambulatory Visit: Payer: Self-pay

## 2023-10-05 DIAGNOSIS — K5900 Constipation, unspecified: Secondary | ICD-10-CM | POA: Diagnosis not present

## 2023-10-05 DIAGNOSIS — G8918 Other acute postprocedural pain: Secondary | ICD-10-CM | POA: Diagnosis not present

## 2023-10-05 MED ORDER — ONDANSETRON HCL 4 MG PO TABS
4.0000 mg | ORAL_TABLET | Freq: Three times a day (TID) | ORAL | 0 refills | Status: DC | PRN
  Filled 2023-10-05: qty 20, 7d supply, fill #0

## 2023-10-10 DIAGNOSIS — G8918 Other acute postprocedural pain: Secondary | ICD-10-CM | POA: Diagnosis not present

## 2023-10-17 ENCOUNTER — Other Ambulatory Visit: Payer: Self-pay

## 2023-10-18 ENCOUNTER — Ambulatory Visit: Attending: Obstetrics and Gynecology

## 2023-11-09 ENCOUNTER — Other Ambulatory Visit: Payer: Self-pay

## 2023-11-09 DIAGNOSIS — F411 Generalized anxiety disorder: Secondary | ICD-10-CM | POA: Diagnosis not present

## 2023-11-09 MED ORDER — ESCITALOPRAM OXALATE 10 MG PO TABS
10.0000 mg | ORAL_TABLET | Freq: Every day | ORAL | 2 refills | Status: DC
  Filled 2023-11-09 – 2023-12-07 (×2): qty 30, 30d supply, fill #0

## 2023-11-09 MED ORDER — HYDROXYZINE HCL 10 MG PO TABS
10.0000 mg | ORAL_TABLET | Freq: Every evening | ORAL | 2 refills | Status: AC | PRN
  Filled 2023-11-09 – 2023-12-07 (×2): qty 30, 30d supply, fill #0
  Filled 2024-06-04: qty 30, 30d supply, fill #1

## 2023-11-24 ENCOUNTER — Other Ambulatory Visit: Payer: Self-pay

## 2023-11-28 DIAGNOSIS — S83412A Sprain of medial collateral ligament of left knee, initial encounter: Secondary | ICD-10-CM | POA: Diagnosis not present

## 2023-11-28 DIAGNOSIS — M6752 Plica syndrome, left knee: Secondary | ICD-10-CM | POA: Diagnosis not present

## 2023-11-28 DIAGNOSIS — M25562 Pain in left knee: Secondary | ICD-10-CM | POA: Diagnosis not present

## 2023-11-28 DIAGNOSIS — G8929 Other chronic pain: Secondary | ICD-10-CM | POA: Diagnosis not present

## 2023-11-28 DIAGNOSIS — M7122 Synovial cyst of popliteal space [Baker], left knee: Secondary | ICD-10-CM | POA: Diagnosis not present

## 2023-12-05 DIAGNOSIS — F411 Generalized anxiety disorder: Secondary | ICD-10-CM | POA: Diagnosis not present

## 2023-12-05 DIAGNOSIS — F4321 Adjustment disorder with depressed mood: Secondary | ICD-10-CM | POA: Diagnosis not present

## 2023-12-07 ENCOUNTER — Other Ambulatory Visit: Payer: Self-pay

## 2023-12-14 ENCOUNTER — Other Ambulatory Visit: Payer: Self-pay

## 2023-12-14 ENCOUNTER — Telehealth: Admitting: Physician Assistant

## 2023-12-14 DIAGNOSIS — J020 Streptococcal pharyngitis: Secondary | ICD-10-CM | POA: Diagnosis not present

## 2023-12-14 MED ORDER — AMOXICILLIN 500 MG PO CAPS
500.0000 mg | ORAL_CAPSULE | Freq: Two times a day (BID) | ORAL | 0 refills | Status: AC
  Filled 2023-12-14: qty 20, 10d supply, fill #0

## 2023-12-14 NOTE — Progress Notes (Signed)

## 2023-12-26 ENCOUNTER — Other Ambulatory Visit: Payer: Self-pay

## 2023-12-26 DIAGNOSIS — B349 Viral infection, unspecified: Secondary | ICD-10-CM | POA: Diagnosis not present

## 2023-12-26 MED ORDER — NAPROXEN 500 MG PO TABS
500.0000 mg | ORAL_TABLET | Freq: Two times a day (BID) | ORAL | 0 refills | Status: AC
  Filled 2023-12-26: qty 20, 10d supply, fill #0

## 2023-12-28 ENCOUNTER — Other Ambulatory Visit: Payer: Self-pay

## 2023-12-28 DIAGNOSIS — F411 Generalized anxiety disorder: Secondary | ICD-10-CM | POA: Diagnosis not present

## 2023-12-28 MED ORDER — ESCITALOPRAM OXALATE 10 MG PO TABS
15.0000 mg | ORAL_TABLET | Freq: Every day | ORAL | 0 refills | Status: DC
  Filled 2023-12-28: qty 45, 30d supply, fill #0

## 2024-01-05 ENCOUNTER — Other Ambulatory Visit: Payer: Self-pay

## 2024-01-13 ENCOUNTER — Other Ambulatory Visit: Payer: Self-pay | Admitting: Surgery

## 2024-01-13 DIAGNOSIS — M238X2 Other internal derangements of left knee: Secondary | ICD-10-CM | POA: Diagnosis not present

## 2024-01-13 DIAGNOSIS — S83412A Sprain of medial collateral ligament of left knee, initial encounter: Secondary | ICD-10-CM | POA: Diagnosis not present

## 2024-01-17 ENCOUNTER — Ambulatory Visit
Admission: RE | Admit: 2024-01-17 | Discharge: 2024-01-17 | Disposition: A | Discharge status: Home / Self Care | Source: Ambulatory Visit | Attending: Surgery | Admitting: Surgery

## 2024-01-17 DIAGNOSIS — M238X2 Other internal derangements of left knee: Secondary | ICD-10-CM | POA: Insufficient documentation

## 2024-01-17 DIAGNOSIS — M7122 Synovial cyst of popliteal space [Baker], left knee: Secondary | ICD-10-CM | POA: Diagnosis not present

## 2024-01-17 DIAGNOSIS — S83412A Sprain of medial collateral ligament of left knee, initial encounter: Secondary | ICD-10-CM | POA: Diagnosis not present

## 2024-01-17 DIAGNOSIS — S83282A Other tear of lateral meniscus, current injury, left knee, initial encounter: Secondary | ICD-10-CM | POA: Diagnosis not present

## 2024-01-31 DIAGNOSIS — F4321 Adjustment disorder with depressed mood: Secondary | ICD-10-CM | POA: Diagnosis not present

## 2024-01-31 DIAGNOSIS — F411 Generalized anxiety disorder: Secondary | ICD-10-CM | POA: Diagnosis not present

## 2024-02-17 DIAGNOSIS — M238X2 Other internal derangements of left knee: Secondary | ICD-10-CM | POA: Diagnosis not present

## 2024-02-17 DIAGNOSIS — M6752 Plica syndrome, left knee: Secondary | ICD-10-CM | POA: Diagnosis not present

## 2024-02-21 ENCOUNTER — Other Ambulatory Visit: Payer: Self-pay | Admitting: Surgery

## 2024-02-28 ENCOUNTER — Other Ambulatory Visit: Payer: Self-pay

## 2024-02-28 ENCOUNTER — Encounter
Admission: RE | Admit: 2024-02-28 | Discharge: 2024-02-28 | Disposition: A | Discharge status: Home / Self Care | Source: Ambulatory Visit | Attending: Surgery | Admitting: Surgery

## 2024-02-28 VITALS — Ht 64.0 in | Wt 168.0 lb

## 2024-02-28 DIAGNOSIS — Z01818 Encounter for other preprocedural examination: Secondary | ICD-10-CM

## 2024-02-28 NOTE — Patient Instructions (Addendum)
 Your procedure is scheduled on: TUESDAY 03/06/24  Report to the Registration Desk on the 1st floor of the [facility]. To find out your arrival time, please call [phone] between 1PM - 3PM on: MONDAY 03/05/24  If your arrival time is 6:00 am, do not arrive before that time as the [facility] entrance doors do not open until 6:00 am.  REMEMBER: Instructions that are not followed completely may result in serious medical risk, up to and including death; or upon the discretion of your surgeon and anesthesiologist your surgery may need to be rescheduled.  Do not eat food after midnight the night before surgery.  No gum chewing or hard candies.  You may however, drink CLEAR liquids up to 2 hours before you are scheduled to arrive for your surgery. Do not drink anything within 2 hours of your scheduled arrival time.  Clear liquids include: - water  - apple juice without pulp - gatorade (not RED colors) - black coffee or tea (Do NOT add milk or creamers to the coffee or tea) Do NOT drink anything that is not on this list.  One week prior to surgery: Stop Anti-inflammatories (NSAIDS) such as Advil , Aleve , Ibuprofen , Motrin , Naproxen , Naprosyn  and Aspirin based products such as Excedrin, Goody's Powder, BC Powder.  Stop ANY OVER THE COUNTER supplements until after surgery.   You may however, continue to take Tylenol  if needed for pain up until the day of surgery.  Continue taking all of your other prescription medications up until the day of surgery.  ON THE DAY OF SURGERY ONLY TAKE THESE MEDICATIONS WITH SIPS OF WATER:  NONE  Use inhalers on the day of surgery and bring to the hospital.  No Alcohol for 24 hours before or after surgery.  No Smoking including e-cigarettes for 24 hours before surgery.  No chewable tobacco products for at least 6 hours before surgery.  No nicotine patches on the day of surgery.  Do not use any recreational drugs for at least a week (preferably 2  weeks) before your surgery.  Please be advised that the combination of cocaine and anesthesia may have negative outcomes, up to and including death. If you test positive for cocaine, your surgery will be cancelled.  On the morning of surgery brush your teeth with toothpaste and water, you may rinse your mouth with mouthwash if you wish. Do not swallow any toothpaste or mouthwash.  Use CHG Soap or wipes as directed on instruction sheet.  Do not wear jewelry, make-up, hairpins, clips or nail polish.  For welded (permanent) jewelry: bracelets, anklets, waist bands, etc.  Please have this removed prior to surgery.  If it is not removed, there is a chance that hospital personnel will need to cut it off on the day of surgery.  Do not wear lotions, powders, or perfumes.   Do not shave body hair from the neck down 48 hours before surgery.  Contact lenses, hearing aids and dentures may not be worn into surgery.  Do not bring valuables to the hospital. [facility] is not responsible for any missing/lost belongings or valuables.    Bring your C-PAP to the hospital in case you may have to spend the night.   Notify your doctor if there is any change in your medical condition (cold, fever, infection).  Wear comfortable clothing (specific to your surgery type) to the hospital.  After surgery, you can help prevent lung complications by doing breathing exercises.  Take deep breaths and cough every  1-2 hours. Your doctor may order a device called an Incentive Spirometer to help you take deep breaths. When coughing or sneezing, hold a pillow firmly against your incision with both hands. This is called "splinting." Doing this helps protect your incision. It also decreases belly discomfort.   If you are being discharged the day of surgery, you will not be allowed to drive home. You will need a responsible individual to drive you home and stay with you for 24 hours after surgery.   If you are taking  public transportation, you will need to have a responsible individual with you.  Please call the Pre-admissions Testing Dept. at [phone] if you have any questions about these instructions.  Surgery Visitation Policy:  Patients having surgery or a procedure may have two visitors.  Children under the age of 83 must have an adult with them who is not the patient.   Merchandiser, retail to address health-related social needs:  [email or web address]                                                                                                             Preparing for Surgery with CHLORHEXIDINE  GLUCONATE (CHG) Soap  Chlorhexidine  Gluconate (CHG) Soap  o An antiseptic cleaner that kills germs and bonds with the skin to continue killing germs even after washing  o Used for showering the night before surgery and morning of surgery  Before surgery, you can play an important role by reducing the number of germs on your skin.  CHG (Chlorhexidine  gluconate) soap is an antiseptic cleanser which kills germs and bonds with the skin to continue killing germs even after washing.  Please do not use if you have an allergy to CHG or antibacterial soaps. If your skin becomes reddened/irritated stop using the CHG.  1. Shower the NIGHT BEFORE SURGERY and the MORNING OF SURGERY with CHG soap.  2. If you choose to wash your hair, wash your hair first as usual with your normal shampoo.  3. After shampooing, rinse your hair and body thoroughly to remove the shampoo.  4. Use CHG as you would any other liquid soap. You can apply CHG directly to the skin and wash gently with a scrungie or a clean washcloth.  5. Apply the CHG soap to your body only from the neck down. Do not use on open wounds or open sores. Avoid contact with your eyes, ears, mouth, and genitals (private parts). Wash face and genitals (private parts) with your normal soap.  6. Wash thoroughly, paying special  attention to the area where your surgery will be performed.  7. Thoroughly rinse your body with warm water.  8. Do not shower/wash with your normal soap after using and rinsing off the CHG soap.  9. Pat yourself dry with a clean towel.  10. Wear clean pajamas to bed the night before surgery.  12. Place clean sheets on your bed the night of your first shower and do not sleep with pets.  13. Shower again with the CHG soap on  the day of surgery prior to arriving at the hospital.  14. Do not apply any deodorants/lotions/powders.  15. Please wear clean clothes to the hospital.

## 2024-03-06 ENCOUNTER — Ambulatory Visit
Admission: RE | Admit: 2024-03-06 | Discharge: 2024-03-06 | Disposition: A | Discharge status: Home / Self Care | Attending: Surgery | Admitting: Surgery

## 2024-03-06 ENCOUNTER — Other Ambulatory Visit: Payer: Self-pay

## 2024-03-06 ENCOUNTER — Ambulatory Visit: Payer: Self-pay | Admitting: Urgent Care

## 2024-03-06 ENCOUNTER — Ambulatory Visit

## 2024-03-06 ENCOUNTER — Encounter: Payer: Self-pay | Admitting: Surgery

## 2024-03-06 ENCOUNTER — Encounter
Admission: RE | Disposition: A | Discharge status: Home / Self Care | Payer: Self-pay | Source: Home / Self Care | Attending: Surgery

## 2024-03-06 ENCOUNTER — Ambulatory Visit: Payer: Self-pay | Admitting: Anesthesiology

## 2024-03-06 DIAGNOSIS — Z87891 Personal history of nicotine dependence: Secondary | ICD-10-CM | POA: Diagnosis not present

## 2024-03-06 DIAGNOSIS — Z9889 Other specified postprocedural states: Secondary | ICD-10-CM | POA: Diagnosis not present

## 2024-03-06 DIAGNOSIS — M238X2 Other internal derangements of left knee: Secondary | ICD-10-CM | POA: Insufficient documentation

## 2024-03-06 DIAGNOSIS — Z01818 Encounter for other preprocedural examination: Secondary | ICD-10-CM

## 2024-03-06 DIAGNOSIS — M6752 Plica syndrome, left knee: Secondary | ICD-10-CM | POA: Diagnosis not present

## 2024-03-06 DIAGNOSIS — S83412A Sprain of medial collateral ligament of left knee, initial encounter: Secondary | ICD-10-CM | POA: Diagnosis not present

## 2024-03-06 DIAGNOSIS — F411 Generalized anxiety disorder: Secondary | ICD-10-CM | POA: Diagnosis not present

## 2024-03-06 HISTORY — PX: KNEE ARTHROSCOPY WITH MEDIAL MENISECTOMY: SHX5651

## 2024-03-06 HISTORY — PX: LIGAMENT REPAIR: SHX5444

## 2024-03-06 HISTORY — PX: MEDIAL PATELLOFEMORAL LIGAMENT REPAIR: SHX2020

## 2024-03-06 LAB — POCT PREGNANCY, URINE: Preg Test, Ur: NEGATIVE

## 2024-03-06 SURGERY — ARTHROSCOPY, KNEE, WITH MEDIAL MENISCECTOMY
Anesthesia: General | Site: Knee | Laterality: Left

## 2024-03-06 MED ORDER — FENTANYL CITRATE (PF) 100 MCG/2ML IJ SOLN
INTRAMUSCULAR | Status: AC
  Filled 2024-03-06: qty 2

## 2024-03-06 MED ORDER — ACETAMINOPHEN 10 MG/ML IV SOLN
INTRAVENOUS | Status: AC
  Filled 2024-03-06: qty 100

## 2024-03-06 MED ORDER — OXYCODONE HCL 5 MG PO TABS: 5.0000 mg | ORAL_TABLET | ORAL | 0 refills | Status: AC | PRN

## 2024-03-06 MED ORDER — CEFAZOLIN SODIUM-DEXTROSE 2-4 GM/100ML-% IV SOLN
2.0000 g | INTRAVENOUS | Status: AC
  Administered 2024-03-06: 2 g via INTRAVENOUS

## 2024-03-06 MED ORDER — BUPIVACAINE-EPINEPHRINE (PF) 0.5% -1:200000 IJ SOLN
INTRAMUSCULAR | Status: AC
  Filled 2024-03-06: qty 60

## 2024-03-06 MED ORDER — METOCLOPRAMIDE HCL 10 MG PO TABS: 5.0000 mg | ORAL_TABLET | Freq: Three times a day (TID) | ORAL | Status: DC | PRN

## 2024-03-06 MED ORDER — METOCLOPRAMIDE HCL 5 MG/ML IJ SOLN: 5.0000 mg | Freq: Three times a day (TID) | INTRAMUSCULAR | Status: DC | PRN

## 2024-03-06 MED ORDER — PROPOFOL 1000 MG/100ML IV EMUL
INTRAVENOUS | Status: AC
  Filled 2024-03-06: qty 100

## 2024-03-06 MED ORDER — ACETAMINOPHEN 10 MG/ML IV SOLN
INTRAVENOUS | Status: DC | PRN
  Administered 2024-03-06: 1000 mg via INTRAVENOUS

## 2024-03-06 MED ORDER — ONDANSETRON HCL 4 MG/2ML IJ SOLN
INTRAMUSCULAR | Status: AC
Start: 2024-03-06 — End: 2024-03-06
  Filled 2024-03-06: qty 2

## 2024-03-06 MED ORDER — SODIUM CHLORIDE 0.9 % IV SOLN: INTRAVENOUS | Status: DC

## 2024-03-06 MED ORDER — PROPOFOL 10 MG/ML IV BOLUS
INTRAVENOUS | Status: AC
  Filled 2024-03-06: qty 20

## 2024-03-06 MED ORDER — BUPIVACAINE LIPOSOME 1.3 % IJ SUSP
INTRAMUSCULAR | Status: AC
  Filled 2024-03-06: qty 20

## 2024-03-06 MED ORDER — LIDOCAINE HCL 1 % IJ SOLN
INTRAMUSCULAR | Status: DC | PRN
  Administered 2024-03-06: 30 mL

## 2024-03-06 MED ORDER — DEXMEDETOMIDINE HCL IN NACL 200 MCG/50ML IV SOLN
INTRAVENOUS | Status: DC | PRN
  Administered 2024-03-06 (×4): 8 ug via INTRAVENOUS
  Administered 2024-03-06: 4 ug via INTRAVENOUS

## 2024-03-06 MED ORDER — MIDAZOLAM HCL 2 MG/2ML IJ SOLN
INTRAMUSCULAR | Status: DC | PRN
  Administered 2024-03-06: 2 mg via INTRAVENOUS

## 2024-03-06 MED ORDER — DEXAMETHASONE SODIUM PHOSPHATE 10 MG/ML IJ SOLN
INTRAMUSCULAR | Status: AC
  Filled 2024-03-06: qty 1

## 2024-03-06 MED ORDER — ROCURONIUM BROMIDE 10 MG/ML (PF) SYRINGE
PREFILLED_SYRINGE | INTRAVENOUS | Status: AC
  Filled 2024-03-06: qty 10

## 2024-03-06 MED ORDER — CHLORHEXIDINE GLUCONATE 0.12 % MT SOLN
15.0000 mL | Freq: Once | OROMUCOSAL | Status: AC
  Administered 2024-03-06: 15 mL via OROMUCOSAL

## 2024-03-06 MED ORDER — PROPOFOL 1000 MG/100ML IV EMUL
INTRAVENOUS | Status: AC
Start: 2024-03-06 — End: 2024-03-06
  Filled 2024-03-06: qty 100

## 2024-03-06 MED ORDER — DEXAMETHASONE SODIUM PHOSPHATE 10 MG/ML IJ SOLN
INTRAMUSCULAR | Status: DC | PRN
Start: 2024-03-06 — End: 2024-03-06
  Administered 2024-03-06: 5 mg via INTRAVENOUS

## 2024-03-06 MED ORDER — BUPIVACAINE-EPINEPHRINE (PF) 0.5% -1:200000 IJ SOLN
INTRAMUSCULAR | Status: DC | PRN
  Administered 2024-03-06 (×2): 30 mL

## 2024-03-06 MED ORDER — FENTANYL CITRATE (PF) 100 MCG/2ML IJ SOLN
25.0000 ug | INTRAMUSCULAR | Status: DC | PRN
  Administered 2024-03-06 (×4): 25 ug via INTRAVENOUS

## 2024-03-06 MED ORDER — LIDOCAINE HCL (CARDIAC) PF 100 MG/5ML IV SOSY
PREFILLED_SYRINGE | INTRAVENOUS | Status: DC | PRN
  Administered 2024-03-06: 50 mg via INTRAVENOUS

## 2024-03-06 MED ORDER — CHLORHEXIDINE GLUCONATE 0.12 % MT SOLN
OROMUCOSAL | Status: AC
  Filled 2024-03-06: qty 15

## 2024-03-06 MED ORDER — CEFAZOLIN SODIUM-DEXTROSE 2-4 GM/100ML-% IV SOLN
INTRAVENOUS | Status: AC
  Filled 2024-03-06: qty 100

## 2024-03-06 MED ORDER — ACETAMINOPHEN 325 MG PO TABS: 325.0000 mg | ORAL_TABLET | Freq: Four times a day (QID) | ORAL | Status: DC | PRN

## 2024-03-06 MED ORDER — KETOROLAC TROMETHAMINE 30 MG/ML IJ SOLN
INTRAMUSCULAR | Status: DC | PRN
  Administered 2024-03-06: 30 mg via INTRAVENOUS

## 2024-03-06 MED ORDER — PROPOFOL 10 MG/ML IV BOLUS
INTRAVENOUS | Status: DC | PRN
  Administered 2024-03-06: 200 mg via INTRAVENOUS
  Administered 2024-03-06: 150 ug/kg/min via INTRAVENOUS
  Administered 2024-03-06: 200 ug/kg/min via INTRAVENOUS
  Administered 2024-03-06: 100 mg via INTRAVENOUS
  Administered 2024-03-06: 60 mg via INTRAVENOUS

## 2024-03-06 MED ORDER — VANCOMYCIN HCL 1000 MG IV SOLR
INTRAVENOUS | Status: AC
Start: 2024-03-06 — End: 2024-03-06
  Filled 2024-03-06: qty 20

## 2024-03-06 MED ORDER — ONDANSETRON HCL 4 MG/2ML IJ SOLN: 4.0000 mg | Freq: Four times a day (QID) | INTRAMUSCULAR | Status: DC | PRN

## 2024-03-06 MED ORDER — ORAL CARE MOUTH RINSE: 15.0000 mL | Freq: Once | OROMUCOSAL | Status: AC

## 2024-03-06 MED ORDER — BUPIVACAINE LIPOSOME 1.3 % IJ SUSP
INTRAMUSCULAR | Status: DC | PRN
  Administered 2024-03-06: 20 mL

## 2024-03-06 MED ORDER — 0.9 % SODIUM CHLORIDE (POUR BTL) OPTIME
TOPICAL | Status: DC | PRN
  Administered 2024-03-06: 500 mL

## 2024-03-06 MED ORDER — KETOROLAC TROMETHAMINE 30 MG/ML IJ SOLN: 30.0000 mg | Freq: Once | INTRAMUSCULAR | Status: DC

## 2024-03-06 MED ORDER — OXYCODONE HCL 5 MG/5ML PO SOLN: 5.0000 mg | Freq: Once | ORAL | Status: AC | PRN

## 2024-03-06 MED ORDER — OXYCODONE HCL 5 MG PO TABS
5.0000 mg | ORAL_TABLET | Freq: Once | ORAL | Status: AC | PRN
  Administered 2024-03-06: 5 mg via ORAL

## 2024-03-06 MED ORDER — RINGERS IRRIGATION IR SOLN
Status: DC | PRN
  Administered 2024-03-06: 1

## 2024-03-06 MED ORDER — ONDANSETRON HCL 4 MG PO TABS: 4.0000 mg | ORAL_TABLET | Freq: Four times a day (QID) | ORAL | Status: DC | PRN

## 2024-03-06 MED ORDER — OXYCODONE HCL 5 MG PO TABS: 5.0000 mg | ORAL_TABLET | ORAL | Status: DC | PRN

## 2024-03-06 MED ORDER — OXYCODONE HCL 5 MG PO TABS
10.0000 mg | ORAL_TABLET | ORAL | 0 refills | Status: AC | PRN
  Filled 2024-03-06: qty 40, 4d supply, fill #0

## 2024-03-06 MED ORDER — VANCOMYCIN HCL 1000 MG IV SOLR
INTRAVENOUS | Status: DC | PRN
  Administered 2024-03-06: 1000 mg via TOPICAL

## 2024-03-06 MED ORDER — KETOROLAC TROMETHAMINE 30 MG/ML IJ SOLN
INTRAMUSCULAR | Status: AC
  Filled 2024-03-06: qty 1

## 2024-03-06 MED ORDER — LIDOCAINE HCL (PF) 1 % IJ SOLN
INTRAMUSCULAR | Status: AC
  Filled 2024-03-06: qty 30

## 2024-03-06 MED ORDER — LACTATED RINGERS IV SOLN: INTRAVENOUS | Status: DC

## 2024-03-06 MED ORDER — ONDANSETRON HCL 4 MG/2ML IJ SOLN
INTRAMUSCULAR | Status: DC | PRN
Start: 2024-03-06 — End: 2024-03-06
  Administered 2024-03-06: 4 mg via INTRAVENOUS

## 2024-03-06 MED ORDER — OXYCODONE HCL 5 MG PO TABS
ORAL_TABLET | ORAL | Status: AC
  Filled 2024-03-06: qty 1

## 2024-03-06 MED ORDER — FENTANYL CITRATE (PF) 100 MCG/2ML IJ SOLN
INTRAMUSCULAR | Status: DC | PRN
  Administered 2024-03-06: 50 ug via INTRAVENOUS
  Administered 2024-03-06: 100 ug via INTRAVENOUS
  Administered 2024-03-06: 50 ug via INTRAVENOUS

## 2024-03-06 MED ORDER — MIDAZOLAM HCL 2 MG/2ML IJ SOLN
INTRAMUSCULAR | Status: AC
  Filled 2024-03-06: qty 2

## 2024-03-06 SURGICAL SUPPLY — 66 items
ADAPTER IRRIG TUBE 2 SPIKE SOL (ADAPTER) ×1 IMPLANT
ANCHOR DBL 2.6 SLF-PNCH FIBRTK (Anchor) IMPLANT
BASIN GRAD PLASTIC 32OZ STRL (MISCELLANEOUS) ×1 IMPLANT
BENZOIN TINCTURE PRP APPL 2/3 (GAUZE/BANDAGES/DRESSINGS) IMPLANT
BIT DRILL 2XNS DISP SS SM FRAG (BIT) ×1 IMPLANT
BLADE FULL RADIUS 3.5 (BLADE) ×1 IMPLANT
BLADE OSC/SAGITTAL MD 9X18.5 (BLADE) ×1 IMPLANT
BLADE SURG 15 STRL LF DISP TIS (BLADE) ×2 IMPLANT
BLADE SURG SZ10 CARB STEEL (BLADE) ×2 IMPLANT
BNDG COHESIVE 6X5 TAN ST LF (GAUZE/BANDAGES/DRESSINGS) ×1 IMPLANT
BNDG ELASTIC 6INX 5YD STR LF (GAUZE/BANDAGES/DRESSINGS) ×1 IMPLANT
BNDG ESMARCH 6X12 STRL LF (GAUZE/BANDAGES/DRESSINGS) ×1 IMPLANT
BRACE KNEE POST OP SHORT (BRACE) IMPLANT
BUR ACROMIONIZER 4.0 (BURR) IMPLANT
BUR SHAVER STR WIDE 5.5 (BURR) ×1 IMPLANT
CHLORAPREP W/TINT 26 (MISCELLANEOUS) ×1 IMPLANT
COOLER ICEMAN CLASSIC (MISCELLANEOUS) IMPLANT
CUFF TRNQT CYL 24X4X16.5-23 (TOURNIQUET CUFF) IMPLANT
CUFF TRNQT CYL 34X4.125X (TOURNIQUET CUFF) IMPLANT
DRAPE ARTHROSCOPY W/POUCH 90 (DRAPES) ×1 IMPLANT
DRAPE IMP U-DRAPE 54X76 (DRAPES) ×1 IMPLANT
DRAPE SHEET LG 3/4 BI-LAMINATE (DRAPES) ×3 IMPLANT
DRAPE TABLE BACK 80X90 (DRAPES) ×1 IMPLANT
DRSG OPSITE POSTOP 3X4 (GAUZE/BANDAGES/DRESSINGS) IMPLANT
DRSG OPSITE POSTOP 4X6 (GAUZE/BANDAGES/DRESSINGS) IMPLANT
GAUZE SPONGE 4X4 12PLY STRL (GAUZE/BANDAGES/DRESSINGS) ×2 IMPLANT
GAUZE XEROFORM 1X8 LF (GAUZE/BANDAGES/DRESSINGS) IMPLANT
GLOVE BIO SURGEON STRL SZ7.5 (GLOVE) ×4 IMPLANT
GLOVE BIO SURGEON STRL SZ8 (GLOVE) ×4 IMPLANT
GLOVE BIOGEL PI IND STRL 8 (GLOVE) ×1 IMPLANT
GLOVE INDICATOR 8.0 STRL GRN (GLOVE) ×1 IMPLANT
GOWN STRL REUS W/ TWL LRG LVL3 (GOWN DISPOSABLE) ×1 IMPLANT
GOWN STRL REUS W/ TWL XL LVL3 (GOWN DISPOSABLE) ×1 IMPLANT
GRAFT TISS SEMITEND 4-8 (Bone Implant) IMPLANT
HANDLE YANKAUER SUCT BULB TIP (MISCELLANEOUS) ×1 IMPLANT
IMPL SYS 2ND FX PEEK 4.75X19.1 (Miscellaneous) IMPLANT
IV LR IRRIG 3000ML ARTHROMATIC (IV SOLUTION) ×4 IMPLANT
KIT KNEE FIBERTAK DISP (KITS) IMPLANT
KIT TURNOVER KIT A (KITS) ×1 IMPLANT
MANIFOLD NEPTUNE II (INSTRUMENTS) ×2 IMPLANT
MAT ABSORB FLUID 56X50 GRAY (MISCELLANEOUS) ×2 IMPLANT
NDL HYPO 21X1.5 SAFETY (NEEDLE) ×1 IMPLANT
NEEDLE HYPO 21X1.5 SAFETY (NEEDLE) ×1 IMPLANT
NS IRRIG 500ML POUR BTL (IV SOLUTION) IMPLANT
PACK ARTHROSCOPY KNEE (MISCELLANEOUS) ×1 IMPLANT
PAD ABD DERMACEA PRESS 5X9 (GAUZE/BANDAGES/DRESSINGS) IMPLANT
PAD COLD UNI WRAP-ON (PAD) ×1 IMPLANT
PENCIL SMOKE EVACUATOR (MISCELLANEOUS) ×1 IMPLANT
PUSHER KNOT ARTHRO 360DEG (MISCELLANEOUS) IMPLANT
SPONGE T-LAP 18X18 ~~LOC~~+RFID (SPONGE) ×2 IMPLANT
STAPLER SKIN PROX 35W (STAPLE) ×1 IMPLANT
STRAP SAFETY 5IN WIDE (MISCELLANEOUS) ×1 IMPLANT
STRIP CLOSURE SKIN 1/2X4 (GAUZE/BANDAGES/DRESSINGS) IMPLANT
SUT PROLENE 4 0 PS 2 18 (SUTURE) IMPLANT
SUT VIC AB 0 CT1 36 (SUTURE) ×1 IMPLANT
SUT VIC AB 2-0 CT1 TAPERPNT 27 (SUTURE) ×3 IMPLANT
SUT VIC AB 3-0 SH 27X BRD (SUTURE) IMPLANT
SUTURE FIBERWR #2 38 BLUE 1/2 (SUTURE) ×4 IMPLANT
SYR 10ML LL (SYRINGE) IMPLANT
SYR 50ML LL SCALE MARK (SYRINGE) ×1 IMPLANT
SYR BULB IRRIG 60ML STRL (SYRINGE) ×1 IMPLANT
TOWEL OR 17X26 4PK STRL BLUE (TOWEL DISPOSABLE) ×3 IMPLANT
TRAP FLUID SMOKE EVACUATOR (MISCELLANEOUS) ×1 IMPLANT
TUBE SET DOUBLEFLO INFLOW (TUBING) ×1 IMPLANT
WAND WEREWOLF FLOW 90D (MISCELLANEOUS) ×1 IMPLANT
WIRE Z .045 C-WIRE SPADE TIP (WIRE) IMPLANT

## 2024-03-06 NOTE — Discharge Instructions (Signed)
 Orthopedic discharge instructions: Keep dressing dry and intact.  May shower after dressing changed on post-op day #4 (Saturday).  Cover staples with Band-Aids after drying off. Apply ice frequently to knee or use Polar Care. Take naproxen  500 mg BID with meals for 3-5 days, then as necessary. Take oxycodone  as prescribed when needed.  May supplement with ES Tylenol  if necessary. May toe-touch weight-bear as tolerated so long as in brace locked in extension - use crutches or walker for balance and support. Follow-up in 10-14 days or as scheduled.

## 2024-03-06 NOTE — Anesthesia Procedure Notes (Signed)
 Procedure Name: LMA Insertion Date/Time: 03/06/2024 1:13 PM  Performed by: Lorrene Camelia LABOR, CRNAPre-anesthesia Checklist: Patient identified, Patient being monitored, Emergency Drugs available and Suction available Patient Re-evaluated:Patient Re-evaluated prior to induction Oxygen Delivery Method: Circle system utilized Preoxygenation: Pre-oxygenation with 100% oxygen Induction Type: IV induction Ventilation: Mask ventilation without difficulty LMA: LMA inserted LMA Size: 4.0 Tube type: Oral Number of attempts: 1 Placement Confirmation: positive ETCO2 and breath sounds checked- equal and bilateral Tube secured with: Tape Dental Injury: Teeth and Oropharynx as per pre-operative assessment

## 2024-03-06 NOTE — Transfer of Care (Signed)
 Immediate Anesthesia Transfer of Care Note  Patient: Debra Richmond  Procedure(s) Performed: ARTHROSCOPY, KNEE, WITH MEDIAL MENISCECTOMY (Left: Knee) RECONSTRUCTION, LIGAMENT, MEDIAL PATELLOFEMORAL (Left: Knee) REPAIR, LIGAMENT (Left: Knee)  Patient Location: PACU  Anesthesia Type:General  Level of Consciousness: drowsy and patient cooperative  Airway & Oxygen Therapy: Patient Spontanous Breathing and Patient connected to nasal cannula oxygen  Post-op Assessment: Report given to RN and Post -op Vital signs reviewed and stable  Post vital signs: Reviewed and stable  Last Vitals:  Vitals Value Taken Time  BP 90/46 03/06/24 15:41  Temp    Pulse 63 03/06/24 15:43  Resp 18 03/06/24 15:43  SpO2 98 % 03/06/24 15:43  Vitals shown include unfiled device data.  Last Pain:  Vitals:   03/06/24 1148  PainSc: 0-No pain         Complications: No notable events documented.

## 2024-03-06 NOTE — Anesthesia Preprocedure Evaluation (Signed)
 Anesthesia Evaluation  Patient identified by MRN, date of birth, ID band Patient awake    Reviewed: Allergy & Precautions, NPO status , Patient's Chart, lab work & pertinent test results  History of Anesthesia Complications (+) PONV, PROLONGED EMERGENCE and history of anesthetic complications  Airway Mallampati: III  TM Distance: <3 FB Neck ROM: full    Dental  (+) Chipped   Pulmonary neg shortness of breath, former smoker   Pulmonary exam normal        Cardiovascular Exercise Tolerance: Good (-) angina (-) Past MI negative cardio ROS Normal cardiovascular exam     Neuro/Psych negative neurological ROS     GI/Hepatic Neg liver ROS,GERD  Controlled,,  Endo/Other  negative endocrine ROS    Renal/GU      Musculoskeletal   Abdominal   Peds  Hematology negative hematology ROS (+)   Anesthesia Other Findings Past Medical History: No date: Abnormal uterine bleeding (AUB) No date: ANA positive No date: Anemia No date: Anxiety No date: Claustrophobia No date: Complication of anesthesia     Comment:  pt states she becomes very defiant after surgery No date: Endometrial polyp No date: Family history of adverse reaction to anesthesia     Comment:  twin sister- n/v No date: GERD (gastroesophageal reflux disease) No date: History of kidney stones 2021: History of methicillin resistant staphylococcus aureus (MRSA) No date: PCOD (polycystic ovarian disease) No date: Pelvic pain No date: Perirectal abscess No date: PONV (postoperative nausea and vomiting) No date: Rotator cuff disorder     Comment:  right shoulder No date: Vitamin D deficiency  Past Surgical History: 09/28/2023: CERVICAL POLYPECTOMY; N/A     Comment:  Procedure: POLYPECTOMY, CERVIX;  Surgeon: Lovetta Beverli GAILS, MD;  Location: [facility] ORS;  Service: Gynecology;              Laterality: N/A; 10/19/2012: CHOLECYSTECTOMY 06/02/2022:  HARDWARE REMOVAL; Left     Comment:  Procedure: REMOVAL OF PAINFUL RETAINED SURGICAL HARDWARE              OF LEFT KNEE.;  Surgeon: Edie Norleen PARAS, MD;  Location:               [facility]S;  Service: Orthopedics;  Laterality: Left; 09/28/2023: HYSTEROSCOPY WITH D & C; N/A     Comment:  Procedure: DILATATION AND CURETTAGE /HYSTEROSCOPY;                Surgeon: Schermerhorn, Beverli GAILS, MD;  Location: [facility]               ORS;  Service: Gynecology;  Laterality: N/A;  MYOSURE 09/28/2023: INTRAUTERINE DEVICE (IUD) INSERTION; N/A     Comment:  Procedure: INSERTION, INTRAUTERINE DEVICE;  Surgeon:               Schermerhorn, Beverli GAILS, MD;  Location: [facility] ORS;                Service: Gynecology;  Laterality: N/A;  MIRENA  10/27/2021: KNEE ARTHROSCOPY WITH ANTERIOR CRUCIATE LIGAMENT (ACL)  REPAIR; Left     Comment:  Procedure: LEFT KNEE ARTHROSCOPY WITH DEBRIDEMENT AND               REPAIR/RECONSTRUCTION OF HER MEDIAL COLLATERAL LIGAMENT;               Surgeon: Edie Norleen PARAS, MD;  Location: [facility] ORS;  Service: Orthopedics;  Laterality: Left; 09/28/2023: LAPAROSCOPY; N/A     Comment:  Procedure: LAPAROSCOPY, DIAGNOSTIC;  Surgeon:               Lovetta Beverli GAILS, MD;  Location: [facility] ORS;                Service: Gynecology;  Laterality: N/A; 04/29/2017: WISDOM TOOTH EXTRACTION; Bilateral     Comment:  upper     Reproductive/Obstetrics negative OB ROS                              Anesthesia Physical Anesthesia Plan  ASA: 2  Anesthesia Plan: General LMA   Post-op Pain Management:    Induction: Intravenous  PONV Risk Score and Plan: Dexamethasone , Ondansetron , Midazolam  and Treatment may vary due to age or medical condition  Airway Management Planned: LMA  Additional Equipment:   Intra-op Plan:   Post-operative Plan: Extubation in OR  Informed Consent: I have reviewed the patients History and Physical, chart, labs and discussed the procedure  including the risks, benefits and alternatives for the proposed anesthesia with the patient or authorized representative who has indicated his/her understanding and acceptance.     Dental Advisory Given  Plan Discussed with: Anesthesiologist, CRNA and Surgeon  Anesthesia Plan Comments: (Patient consented for risks of anesthesia including but not limited to:  - adverse reactions to medications - damage to eyes, teeth, lips or other oral mucosa - nerve damage due to positioning  - sore throat or hoarseness - Damage to heart, brain, nerves, lungs, other parts of body or loss of life  Patient voiced understanding and assent.)        Anesthesia Quick Evaluation

## 2024-03-06 NOTE — H&P (Signed)
 History of Present Illness:  Debra Richmond is a 33 y.o. female who presents for follow-up of her chronic left knee pain. She is now nearly 2.5 years status post an arthroscopic debridement of a symptomatic medial plica and open repair of her medial collateral ligament with internal brace augmentation, and over 1.5 years status post removal of retained painful hardware from her left knee. The patient notes little change in her symptoms since her last visit 1 month ago. She continues to experience moderate pain in her knee which she rates at 5/10 on today's visit. She has been taking Tylenol  and naproxen , and applying Tiger balm as necessary with limited benefit. She continues to localize the pain to the medial and posterior medial aspects of her knee, and continues to note occasional episodes of giving way. She denies any recent reinjury to the knee, and denies any fevers or chills. She also denies any numbness or paresthesias down her leg to her foot. 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 Since her last visit, she has undergone an MRI scan of the left knee and presents today to review these results.  Current Outpatient Medications:  acetaminophen  (TYLENOL  ORAL) Take 200 mg by mouth once as needed  escitalopram  oxalate (LEXAPRO ) 10 MG tablet Take 1 tablet (10 mg total) by mouth once daily NEEDS AN APPOINTMENT FOR FURTHER REFILLS 30 tablet 0  hydrOXYzine  HCL (ATARAX ) 10 MG tablet Take 10 mg by mouth at bedtime as needed  naproxen  (NAPROSYN ) 500 MG tablet Take 800 mg by mouth once daily as needed   Allergies:  Clindamycin  Other (Chest tightness)  Dilaudid [Hydromorphone (Bulk)] Hives  Doxycycline  Nausea and Vomiting  Garlic Hives  Prednisone  Other (Disorientation)  Sulfa (Sulfonamide Antibiotics) Nausea and Vomiting  Vancomycin  Rash   Past Medical History:  Abdominal pain  Intermittent & takes tramadol  to help w/ this PRN  ANA positive 2019  Anxiety   Gastritis 03/26/2015  History of abnormal cervical Pap smear  Miscarriage ([facility]-HCC) 09/14, 01/15  MRSA (methicillin resistant Staphylococcus aureus)  Multiple skin abscesses requiring I&D.  PCOS (polycystic ovarian syndrome)  Perirectal abscess 12/2015  Reflux esophagitis 08/14/2012  w/ focal area of intestinal metaplasia c/w Barrett's esophagus.  Right shoulder tendinitis  Seborrheic dermatitis of scalp  Occipital scalp  Torn rotator cuff  Vitamin D deficiency 08/2019  Vit D= 23.7   Past Surgical History:  COLONOSCOPY 08/14/2012  CHOLECYSTECTOMY 10/19/2012  EGD 03/26/2015 (Gastritis/Mild Esophagitis/No Repeat/MGR)  Arthroscopic debridement of symptomatic medial plica, open repair of MCL with internal brace augmentation, left knee Left 10/27/2021 (Dr. Edie)  Removal of surgical hardware from medial femoral condyle and medial tibia, left knee Left 06/02/2022 (Dr. Edie)  EGD 08/14/12, 08/03/13  wisdom teeth   Family History:   High blood pressure (Mother Lonell)  Endometriosis (Mother Lonell)  Gallbladder disease (Mother Lonell)  Lupus (Mother Lonell)  Clotting disorder (Mother Lonell)  Myocardial Infarction (Father)  High blood pressure (Sister Lauren)  Hemochromatosis (Sister Lauren)  Diabetes (Maternal Aunt Daphne)  Uterine cancer Maternal Aunt Garrison (aged 32)  Melanoma (Maternal Aunt Garrison Sprang)  High blood pressure (Maternal Jeannine Jacob)  Coronary Artery Disease (Maternal Grandmother Jacob)  Deep vein thrombosis (Maternal Grandmother Jacob)  Dementia (Maternal Grandmother Jacob)  Rheum arthritis (Maternal Grandmother Jacob)  Colon cancer Neg Hx  Cervical cancer Neg Hx  Ovarian cancer Neg Hx  Breast cancer Neg Hx   Social History:   Socioeconomic History:  Marital status: Married  Tobacco Use  Smoking  status: Former  Current packs/day: 0.25  Types: Cigarettes  Smokeless tobacco: Never  Vaping Use  Vaping status: Never Used  Substance and Sexual Activity  Alcohol  use: Not Currently  Comment: Occassionally  Drug use: Never  Sexual activity: Not Currently  Partners: Female  Birth control/protection: None   Social Drivers of Health:   Physicist, medical Strain: Medium Risk (11/28/2023)  Overall Financial Resource Strain (CARDIA)  Difficulty of Paying Living Expenses: Somewhat hard  Food Insecurity: No Food Insecurity (11/28/2023)  Hunger Vital Sign  Worried About Running Out of Food in the Last Year: Never true  Ran Out of Food in the Last Year: Never true  Transportation Needs: No Transportation Needs (11/28/2023)  PRAPARE - Risk analyst (Medical): No  Lack of Transportation (Non-Medical): No   Review of Systems:  A comprehensive 14 point ROS was performed, reviewed, and the pertinent orthopaedic findings are documented in the HPI.  Physical Exam: Vitals:  02/17/24 0905  BP: 120/72  Weight: 78.5 kg (173 lb)  Height: 162.6 cm (5' 4)  PainSc: 5  PainLoc: Knee   General/Constitutional: The patient appears to be well-nourished, well-developed, and in no acute distress. Neuro/Psych: Normal mood and affect, oriented to person, place and time. Eyes: Non-icteric. Pupils are equal, round, and reactive to light, and exhibit synchronous movement. ENT: Unremarkable. Lymphatic: No palpable adenopathy. Respiratory: Lungs clear to auscultation, Normal chest excursion, No wheezes, and Non-labored breathing Cardiovascular: Regular rate and rhythm. No murmurs. and No edema, swelling or tenderness, except as noted in detailed exam. Integumentary: No impressive skin lesions present, except as noted in detailed exam. Musculoskeletal: Unremarkable, except as noted in detailed exam.  Left knee exam: The patient relates with an essentially normal gait, and is not using any assistive devices. On inspection, her surgical incisions remain well-healed and without evidence for infection. No swelling, erythema, ecchymosis, abrasions, or  other skin abnormalities are identified. She exhibits at most a trace effusion. There is moderate tenderness to palpation along the medial epicondylar region, the posteromedial joint line, and over the medial aspect of the proximal tibia along the medial collateral ligament, but there is no lateral or peripatellar tenderness. Actively, she can extend her knee to 0 degrees and flex her knee to 115 degrees with mild discomfort posteromedially in maximal flexion. She exhibits at most minimal laxity to valgus stressing, and experiences mild reproduction of her medial-sided knee pain with valgus stressing. Again experiences increased pain with anterolateral rotation of her tibia, consistent with increased posteromedial joint laxity. She has a negative Lachman's test. Her patella tracks well and is without crepitance. She remains neurovascularly intact to the left lower extremity and foot.   X-rays/MRI/Lab data:  A recent MRI scan of the left knee is available for review and has been reviewed by myself. By report, the scan demonstrates evidence of postsurgical changes involving the proximal medial collateral ligament without an obvious recurrent tear. There also is evidence of a small horizontal tear of the inner third of the anterior horn-body junction of the lateral meniscus. No other bony or soft tissue abnormalities are identified. Both the films report reviewed by myself and discussed with the patient.  Assessment: Chronic Grade 2 sprain of medial collateral ligament of left knee.   Plan: The treatment options were discussed with the patient. In addition, patient educational materials were provided regarding the diagnosis and treatment options. The patient is quite frustrated by her continued symptoms and function limitations, and is ready to consider more  aggressive treatment options. Based on her history and examination findings, I have recommended a surgical procedure, specifically a left knee  arthroscopy with formal allograft reconstruction of her medial collateral ligament and posterior oblique ligament of the left knee. The procedure was discussed with the patient, as were the potential risks (including bleeding, infection, nerve and/or blood vessel injury, persistent or recurrent pain, failure of the repair, progression of arthritis, need for further surgery, blood clots, strokes, heart attacks and/or arhythmias, pneumonia, etc.) and benefits. The patient states her understanding and wishes to proceed. All of the patient's questions and concerns were answered. She can call any time with further concerns. She will follow up post-surgery, routine.    H&P reviewed and patient re-examined. No changes.

## 2024-03-06 NOTE — Op Note (Signed)
 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  Surgeon:   DOROTHA Reyes Maltos, MD  Assistant:   Gustavo Level, PA-C; Dozier Cuba, PA-S  Anesthesia:   General LMA  Findings:   As above.  There was evidence of partial-thickness fraying of the intrameniscal ligament region as well as some fraying of the medial-most fibers of the anterior cruciate ligament.  Otherwise, the anterior cruciate ligament was in satisfactory condition, as was the posterior cruciate ligament.  The medial and lateral menisci also were in excellent condition.  The articular surfaces of the femur, tibia, and patella all were in satisfactory condition as well.  Complications:   None  Fluids:   700 cc crystalloid  EBL:   3 cc  UOP:   None  TT:   85 minutes at 300 mmHg  Drains:   None  Closure:   3-0 Vicryl subcuticular sutures for the incisions and 4-0 Prolene interrupted sutures for the portal sites.  Implants:   Arthrex 2.6 mm Double Loaded Knotless Knee FiberTak anchors x 4, Arthrex PEEK SwiveLock anchor x 1  Brief Clinical Note:   The Debra Richmond is a 33 year old female who is now approximately 3.5 years status post a grade 2 MCL injury to her left knee.  After prolonged nonsurgical treatment, the Debra Richmond underwent an open repair of the medial collateral ligament with internal brace augmentation 2.5 years ago.  However, she has continued to experience intermittent episodes of pain and feelings of instability/laxity.  The symptoms have persisted despite physical therapy, activity modification, use of a knee brace, etc.  Her history and examination are consistent with residual chronic medial collateral ligament insufficiency.  A recent MRI  scan of the knee shows no other pathology which might be contributing to her symptoms.  The Debra Richmond presents at this time for a left knee arthroscopy and open allograft reconstruction of the medial collateral ligament.  Procedure:   The Debra Richmond was brought into the operating room and lain in the supine position.  After adequate general laryngeal mask anesthesia was obtained, a timeout was performed to verify the appropriate side.  The Debra Richmond's left knee was examined under anesthesia.  There was mild laxity to valgus stressing as well as with anteromedial rotatory stressing of the knee.  However, there was no laxity with a Lachman maneuver.  The knee was injected sterilely using a solution of 30 cc of 1% lidocaine  and 30 cc of 0.5% Sensorcaine  with epinephrine .  The left lower extremity was prepped with ChloraPrep solution before being draped sterilely.  Preoperative antibiotics were administered.  The expected portal sites and surgical incision sites were injected with a solution of 10 cc of Exparel  and 10 cc of 0.5% Sensorcaine  with epinephrine .  The camera was placed in the anterolateral portal and instrumentation performed through the anteromedial portal.  The knee was sequentially examined beginning in the suprapatellar pouch, then progressing to the patellofemoral space, the medial gutter and compartment, the notch, and finally the lateral compartment and gutter.  The findings were as described above.  Abundant reactive synovial tissues anteriorly were debrided using the full-radius resector in order to improve visualization.  The area of fraying in the area of the intrameniscal ligament anteriorly was debrided back to stable margins using the  ArthroCare wand.  The area of superficial fraying of the medial insertional fibers of the anterior cruciate ligament also were annealed using the ArthroCare wand.  The instruments were removed from the joint after suctioning the excess fluid.  The previous medial  incision was reopened, ellipsing the old scar in the process, and carried out through the subcutaneous tissues to expose the medial retinaculum.  The superficial layer medial retinaculum was incised the length of the incision.  Two 0.045 K wires were placed, 1 at the medial epicondylar region and the second approximately 8 to 10 mm below the medial tibial plateau.  The adequacy of pin position was verified fluoroscopically in AP and lateral projections.  Isometry also was assessed using a suture wrapped around both pins.  After several attempts, pin position was optimized for isometry.   The pin at the medial epicondyle was removed and replaced with a single Arthrex 2.6 mm Double Loaded Knotless Knee FiberTak anchor.  A second Arthrex 2.6 mm Double Loaded Knotless Knee FiberTak anchor was placed approximately 1 cm proximal to this anchor.  A third Arthrex 2.6 mm Double Loaded Knotless Knee FiberTak anchor was placed in the area of the POL tibial insertion.    A second 2 to 3 cm incision was made more distally along the medial aspect of the proximal tibia, ellipsing out her prior scar.  The incision was carried down through the subcutaneous tissues to expose the medial border of the tibia.  The fourth Arthrex 2.6 mm Double Loaded Knotless Knee FiberTak anchor was inserted at this point 7 cm below the joint line to correspond to the insertion of the superficial portion of the medial collateral ligament.  Meanwhile, the 7 x 250 mm semitendinosis tendon allograft was thawed and prepared on the back table by placing whipstitches to both the proximal and distal ends of the graft.  The graft passed from proximal to distal through the most proximal anchor in the medial epicondyle so that the anchor divided the graft into a one third length limb and a two thirds length limb.  Both ends of the graft were then passed through the more distal of the two medial epicondylar loop anchors.  First the more proximal and then the  more distal loop anchors were tightened securely.  The shorter end of the graft was passed through the third loop anchor placed at the tibial insertion of the posterior oblique ligament.  With the knee slightly flexed and with a varus load applied to the knee, this loop anchor was tightened over the end of the graft.  One of the suture ends was passed back through the graft using a free needle and tied to the other suture limb to act as a ripstop.  The longer graft segment was tunneled subcutaneously from the distal end of the more proximal incision to the more distal incision then passed through the fourth loop anchor.  Again, with firm tension applied to the graft and with the knee in some varus and slight flexion, this loop anchor was tightened securely as well.  Once again, one of the suture ends was passed back through the graft using a free needle and tied to the other suture limb to act as a ripstop.  At this point, the two limbs of the second suture anchor were brought distally and secured to the tibial isometry point as determined with the 0.045 K wires earlier using a PEEK SwiveLock.  Again this was done with the knee in varus  and mild flexion.  The tapes were then brought back proximally and secured to the graft at the medial epicondyle to further reinforce this internal brace construct.  The suture from the PEEK SwiveLock was passed through the anterior portion of the allograft and tied securely to provide additional stabilization to the graft construct by replicating the deeper fibers of the MCL graft.  Gentle valgus stressing and assessment of anteromedial rotational laxity of the left knee demonstrated excellent restoration of stability with both maneuvers.  The wounds were copiously irrigated with sterile saline solution before a solution of 10 cc of Exparel  plus 20 cc of 0.5% Sensorcaine  with epinephrine  was injected in around the incision sites to help with postoperative analgesia.  In  the medial wound, the superficial retinacular layer was reapproximated over the allograft using #0 Vicryl interrupted sutures.  The subcutaneous tissues were closed using 2-0 and 3-0 Vicryl interrupted sutures before benzoin and Steri-Strips were applied to the skin.  The portal sites were closed using 4-0 Prolene interrupted sutures.  Sterile occlusive dressings were applied to the medial wounds before a sterile bulky dressing was applied to the knee, incorporating an electrical pad.  The Debra Richmond was placed into a hinged knee brace with the hinges set at 0 to 90 degrees but locked in extension.  The Debra Richmond was then awakened, extubated, and returned to the recovery room in satisfactory condition after tolerating the procedure well.

## 2024-03-07 ENCOUNTER — Encounter: Payer: Self-pay | Admitting: Surgery

## 2024-03-07 NOTE — Anesthesia Postprocedure Evaluation (Signed)
 Anesthesia Post Note  Patient: Debra Richmond  Procedure(s) Performed: ARTHROSCOPY, KNEE, WITH MEDIAL MENISCECTOMY (Left: Knee) RECONSTRUCTION, LIGAMENT, MEDIAL PATELLOFEMORAL (Left: Knee) REPAIR, LIGAMENT (Left: Knee)  Patient location during evaluation: PACU Anesthesia Type: General Level of consciousness: awake and alert Pain management: pain level controlled Vital Signs Assessment: post-procedure vital signs reviewed and stable Respiratory status: spontaneous breathing, nonlabored ventilation and respiratory function stable Cardiovascular status: blood pressure returned to baseline and stable Postop Assessment: no apparent nausea or vomiting Anesthetic complications: no   No notable events documented.   Last Vitals:  Vitals:   03/06/24 1650 03/06/24 1708  BP:  116/82  Pulse: 71 69  Resp: 11 18  Temp: (!) 36.2 C (!) 36.3 C  SpO2: 100% 100%    Last Pain:  Vitals:   03/06/24 1708  TempSrc: Temporal  PainSc: 0-No pain                 Fairy POUR Keagan Brislin

## 2024-03-13 DIAGNOSIS — F411 Generalized anxiety disorder: Secondary | ICD-10-CM | POA: Diagnosis not present

## 2024-03-13 DIAGNOSIS — F4312 Post-traumatic stress disorder, chronic: Secondary | ICD-10-CM | POA: Diagnosis not present

## 2024-03-13 DIAGNOSIS — F603 Borderline personality disorder: Secondary | ICD-10-CM | POA: Diagnosis not present

## 2024-03-20 DIAGNOSIS — F603 Borderline personality disorder: Secondary | ICD-10-CM | POA: Diagnosis not present

## 2024-03-20 DIAGNOSIS — F411 Generalized anxiety disorder: Secondary | ICD-10-CM | POA: Diagnosis not present

## 2024-03-20 DIAGNOSIS — F4312 Post-traumatic stress disorder, chronic: Secondary | ICD-10-CM | POA: Diagnosis not present

## 2024-03-23 DIAGNOSIS — F4312 Post-traumatic stress disorder, chronic: Secondary | ICD-10-CM | POA: Diagnosis not present

## 2024-03-23 DIAGNOSIS — F411 Generalized anxiety disorder: Secondary | ICD-10-CM | POA: Diagnosis not present

## 2024-03-23 DIAGNOSIS — F603 Borderline personality disorder: Secondary | ICD-10-CM | POA: Diagnosis not present

## 2024-03-28 DIAGNOSIS — F411 Generalized anxiety disorder: Secondary | ICD-10-CM | POA: Diagnosis not present

## 2024-03-28 DIAGNOSIS — F4312 Post-traumatic stress disorder, chronic: Secondary | ICD-10-CM | POA: Diagnosis not present

## 2024-03-28 DIAGNOSIS — F603 Borderline personality disorder: Secondary | ICD-10-CM | POA: Diagnosis not present

## 2024-04-03 DIAGNOSIS — F4312 Post-traumatic stress disorder, chronic: Secondary | ICD-10-CM | POA: Diagnosis not present

## 2024-04-03 DIAGNOSIS — F411 Generalized anxiety disorder: Secondary | ICD-10-CM | POA: Diagnosis not present

## 2024-04-03 DIAGNOSIS — F603 Borderline personality disorder: Secondary | ICD-10-CM | POA: Diagnosis not present

## 2024-04-05 DIAGNOSIS — F603 Borderline personality disorder: Secondary | ICD-10-CM | POA: Diagnosis not present

## 2024-04-05 DIAGNOSIS — F411 Generalized anxiety disorder: Secondary | ICD-10-CM | POA: Diagnosis not present

## 2024-04-05 DIAGNOSIS — F4312 Post-traumatic stress disorder, chronic: Secondary | ICD-10-CM | POA: Diagnosis not present

## 2024-04-11 DIAGNOSIS — F411 Generalized anxiety disorder: Secondary | ICD-10-CM | POA: Diagnosis not present

## 2024-04-11 DIAGNOSIS — F603 Borderline personality disorder: Secondary | ICD-10-CM | POA: Diagnosis not present

## 2024-04-11 DIAGNOSIS — F4312 Post-traumatic stress disorder, chronic: Secondary | ICD-10-CM | POA: Diagnosis not present

## 2024-04-19 DIAGNOSIS — F4312 Post-traumatic stress disorder, chronic: Secondary | ICD-10-CM | POA: Diagnosis not present

## 2024-04-19 DIAGNOSIS — F603 Borderline personality disorder: Secondary | ICD-10-CM | POA: Diagnosis not present

## 2024-04-19 DIAGNOSIS — F411 Generalized anxiety disorder: Secondary | ICD-10-CM | POA: Diagnosis not present

## 2024-04-23 DIAGNOSIS — F603 Borderline personality disorder: Secondary | ICD-10-CM | POA: Diagnosis not present

## 2024-04-23 DIAGNOSIS — F411 Generalized anxiety disorder: Secondary | ICD-10-CM | POA: Diagnosis not present

## 2024-04-23 DIAGNOSIS — F4312 Post-traumatic stress disorder, chronic: Secondary | ICD-10-CM | POA: Diagnosis not present

## 2024-05-02 DIAGNOSIS — F603 Borderline personality disorder: Secondary | ICD-10-CM | POA: Diagnosis not present

## 2024-05-02 DIAGNOSIS — F4312 Post-traumatic stress disorder, chronic: Secondary | ICD-10-CM | POA: Diagnosis not present

## 2024-05-02 DIAGNOSIS — F411 Generalized anxiety disorder: Secondary | ICD-10-CM | POA: Diagnosis not present

## 2024-05-04 DIAGNOSIS — F411 Generalized anxiety disorder: Secondary | ICD-10-CM | POA: Diagnosis not present

## 2024-05-04 DIAGNOSIS — F603 Borderline personality disorder: Secondary | ICD-10-CM | POA: Diagnosis not present

## 2024-05-04 DIAGNOSIS — F4312 Post-traumatic stress disorder, chronic: Secondary | ICD-10-CM | POA: Diagnosis not present

## 2024-05-10 DIAGNOSIS — F411 Generalized anxiety disorder: Secondary | ICD-10-CM | POA: Diagnosis not present

## 2024-05-10 DIAGNOSIS — F603 Borderline personality disorder: Secondary | ICD-10-CM | POA: Diagnosis not present

## 2024-05-10 DIAGNOSIS — F4312 Post-traumatic stress disorder, chronic: Secondary | ICD-10-CM | POA: Diagnosis not present

## 2024-06-04 ENCOUNTER — Other Ambulatory Visit: Payer: Self-pay

## 2024-06-20 ENCOUNTER — Telehealth: Admitting: Emergency Medicine

## 2024-06-20 ENCOUNTER — Other Ambulatory Visit: Payer: Self-pay

## 2024-06-20 DIAGNOSIS — J111 Influenza due to unidentified influenza virus with other respiratory manifestations: Secondary | ICD-10-CM

## 2024-06-20 MED ORDER — OSELTAMIVIR PHOSPHATE 75 MG PO CAPS
75.0000 mg | ORAL_CAPSULE | Freq: Two times a day (BID) | ORAL | 0 refills | Status: AC
  Filled 2024-06-20: qty 10, 5d supply, fill #0

## 2024-06-20 NOTE — Progress Notes (Signed)
 " Virtual Visit Consent   Jaquisha Frech, you are scheduled for a virtual visit with a [facility] provider today. Just as with appointments in the office, your consent must be obtained to participate. Your consent will be active for this visit and any virtual visit you may have with one of our providers in the next 365 days. If you have a MyChart account, a copy of this consent can be sent to you electronically.  As this is a virtual visit, video technology does not allow for your provider to perform a traditional examination. This may limit your provider's ability to fully assess your condition. If your provider identifies any concerns that need to be evaluated in person or the need to arrange testing (such as labs, EKG, etc.), we will make arrangements to do so. Although advances in technology are sophisticated, we cannot ensure that it will always work on either your end or our end. If the connection with a video visit is poor, the visit may have to be switched to a telephone visit. With either a video or telephone visit, we are not always able to ensure that we have a secure connection.  By engaging in this virtual visit, you consent to the provision of healthcare and authorize for your insurance to be billed (if applicable) for the services provided during this visit. Depending on your insurance coverage, you may receive a charge related to this service.  I need to obtain your verbal consent now. Are you willing to proceed with your visit today? Eren Puebla has provided verbal consent on 06/20/2024 for a virtual visit (video or telephone). Jon CHRISTELLA Belt, NP  Date: 06/20/2024 4:50 PM   Virtual Visit via Video Note   I, Jon CHRISTELLA Belt, connected with  Ashani Pumphrey  ([id number], 1990/11/12) on 06/20/24 at  4:45 PM EST by a video-enabled telemedicine application and verified that I am speaking with the correct person using two identifiers.  Location: Patient: Virtual Visit Location Patient:  Home Provider: Virtual Visit Location Provider: Home Office   I discussed the limitations of evaluation and management by telemedicine and the availability of in person appointments. The patient expressed understanding and agreed to proceed.    History of Present Illness: Debra Richmond is a 34 y.o. who identifies as a female who was assigned female at birth, and is being seen today for flu  Tested pos flu A today  Sx  evening of 06/17/24 with mild cough. Now temp 102F. Body aches, runny nose. No SOB or wheezing.     HPI: HPI  Problems:  Patient Active Problem List   Diagnosis Date Noted   Generalized anxiety disorder 10/02/2022   Cutaneous abscess of left upper extremity 01/21/2016   PCOS (polycystic ovarian syndrome) 01/14/2016   Right shoulder strain 08/27/2015   Right shoulder tendinitis 08/27/2015   Gastro-esophageal reflux disease with esophagitis 08/14/2012    Allergies: Allergies[1] Medications: Current Medications[2]  Observations/Objective: Patient is well-developed, well-nourished in no acute distress.  Resting comfortably  at home.  Head is normocephalic, atraumatic.  No labored breathing.  Speech is clear and coherent with logical content.  Patient is alert and oriented at baseline.    Assessment and Plan: 1. Influenza (Primary) - oseltamivir  (TAMIFLU ) 75 MG capsule; Take 1 capsule (75 mg total) by mouth 2 (two) times daily.  Dispense: 10 capsule; Refill: 0  Tamiflu  flags as she is allergic to hydromorphone - I have asked her to discuss this with pharmacist prior to taking tamiflu  to  ensure she can take it.   Given work note.   Follow Up Instructions: I discussed the assessment and treatment plan with the patient. The patient was provided an opportunity to ask questions and all were answered. The patient agreed with the plan and demonstrated an understanding of the instructions.  A copy of instructions were sent to the patient via MyChart unless otherwise noted  below.    The patient was advised to call back or seek an in-person evaluation if the symptoms worsen or if the condition fails to improve as anticipated.    Jon CHRISTELLA Belt, NP    [1]  Allergies Allergen Reactions   Prednisone  Other (See Comments)    Disorientation   Dilaudid [Hydromorphone Hcl] Hives   Hydromorphone Rash   Doxycycline  Nausea Only and Nausea And Vomiting   Garlic Nausea Only   Clindamycin  Diarrhea    Chest tighteness   Sulfa Antibiotics Nausea And Vomiting and Nausea Only   Vancomycin  Rash  [2]  Current Outpatient Medications:    oseltamivir  (TAMIFLU ) 75 MG capsule, Take 1 capsule (75 mg total) by mouth 2 (two) times daily., Disp: 10 capsule, Rfl: 0   hydrOXYzine  (ATARAX ) 10 MG tablet, Take 1 tablet (10 mg total) by mouth at bedtime as needed for sleep, Disp: 30 tablet, Rfl: 2   naproxen  (NAPROSYN ) 500 MG tablet, Take 1 tablet (500 mg total) by mouth 2 (two) times daily. (Patient taking differently: Take 500 mg by mouth 2 (two) times daily as needed (knee pain.).), Disp: 20 tablet, Rfl: 0   oxyCODONE  (OXY IR/ROXICODONE ) 5 MG immediate release tablet, Take 1 to 2 tablets (5 to 10 mg total) by mouth every 4 (four) hours as needed FOR MODERATE OR SEVERE PAIN.., Disp: 40 tablet, Rfl: 0   oxyCODONE  (ROXICODONE ) 5 MG immediate release tablet, Take 1-2 tablets (5-10 mg total) by mouth every 4 (four) hours as needed for moderate pain (pain score 4-6) or severe pain (pain score 7-10)., Disp: 40 tablet, Rfl: 0  "

## 2024-07-06 ENCOUNTER — Other Ambulatory Visit: Payer: Self-pay
